# Patient Record
Sex: Male | Born: 1965 | Race: White | Hispanic: No | Marital: Married | Smoking: Former smoker
Health system: Southern US, Community
[De-identification: ages and names within clinical notes are randomized; demographics above are authoritative.]

## PROBLEM LIST (undated history)

## (undated) DIAGNOSIS — M1990 Unspecified osteoarthritis, unspecified site: Secondary | ICD-10-CM

## (undated) DIAGNOSIS — N189 Chronic kidney disease, unspecified: Secondary | ICD-10-CM

## (undated) DIAGNOSIS — G629 Polyneuropathy, unspecified: Secondary | ICD-10-CM

## (undated) DIAGNOSIS — I10 Essential (primary) hypertension: Secondary | ICD-10-CM

## (undated) DIAGNOSIS — E119 Type 2 diabetes mellitus without complications: Secondary | ICD-10-CM

## (undated) DIAGNOSIS — E7800 Pure hypercholesterolemia, unspecified: Secondary | ICD-10-CM

## (undated) HISTORY — PX: ANKLE SURGERY: SHX546

## (undated) HISTORY — PX: COLONOSCOPY: SHX5424

## (undated) HISTORY — DX: Type 2 diabetes mellitus without complications: E11.9

## (undated) HISTORY — PX: CARPAL TUNNEL RELEASE: SHX101

---

## 2013-06-07 ENCOUNTER — Encounter: Payer: BC Managed Care – PPO | Attending: Family Medicine

## 2013-06-07 VITALS — Ht 72.0 in | Wt 301.6 lb

## 2013-06-07 DIAGNOSIS — E119 Type 2 diabetes mellitus without complications: Secondary | ICD-10-CM | POA: Insufficient documentation

## 2013-06-07 DIAGNOSIS — E1165 Type 2 diabetes mellitus with hyperglycemia: Secondary | ICD-10-CM

## 2013-06-07 DIAGNOSIS — IMO0001 Reserved for inherently not codable concepts without codable children: Secondary | ICD-10-CM

## 2013-06-07 DIAGNOSIS — Z713 Dietary counseling and surveillance: Secondary | ICD-10-CM | POA: Insufficient documentation

## 2013-06-07 NOTE — Progress Notes (Signed)
Patient was seen on 06/07/13 for the first of a series of three diabetes self-management courses at the [facility].  Current HbA1c: 11.3% stated by pt  The following learning objectives were met by the patient during this class:  Describe diabetes  State some common risk factors for diabetes  Defines the role of glucose and insulin  Identifies type of diabetes and pathophysiology  Describe the relationship between diabetes and cardiovascular risk  State the members of the Healthcare Team  States the rationale for glucose monitoring  State when to test glucose  State their individual Target Range  State the importance of logging glucose readings  Describe how to interpret glucose readings  Identifies A1C target  Explain the correlation between A1c and eAG values  State symptoms and treatment of high blood glucose  State symptoms and treatment of low blood glucose  Explain proper technique for glucose testing  Identifies proper sharps disposal  Handouts given during class include:  Living Well with Diabetes book  Carb Counting and Meal Planning book  Meal Plan Card  Carbohydrate guide  Meal planning worksheet  Low Sodium Flavoring Tips  The diabetes portion plate  M6Y to eAG Conversion Chart  Diabetes Medications  Diabetes Recommended Care Schedule  Support Group  Diabetes Success Plan  Core Class Satisfaction Survey  Follow-Up Plan:  Attend core 2

## 2013-06-14 DIAGNOSIS — E119 Type 2 diabetes mellitus without complications: Secondary | ICD-10-CM

## 2013-06-15 NOTE — Progress Notes (Signed)

## 2013-06-21 DIAGNOSIS — E119 Type 2 diabetes mellitus without complications: Secondary | ICD-10-CM

## 2013-06-21 NOTE — Progress Notes (Signed)
Patient was seen on 06/21/2013 for the third of a series of three diabetes self-management courses at the [facility]. The following learning objectives were met by the patient during this class:    State the amount of activity recommended for healthy living   Describe activities suitable for individual needs   Identify ways to regularly incorporate activity into daily life   Identify barriers to activity and ways to over come these barriers  Identify diabetes medications being personally used and their primary action for lowering glucose and possible side effects   Describe role of stress on blood glucose and develop strategies to address psychosocial issues   Identify diabetes complications and ways to prevent them  Explain how to manage diabetes during illness   Evaluate success in meeting personal goal   Establish 2-3 goals that they will plan to diligently work on until they return for the  10-monthfollow-up visit  Goals:  Follow Diabetes Meal Plan as instructed  Aim for 15-30 mins of physical activity daily as tolerated  Bring food record and glucose log to your follow up visit  Your patient has established the following 4 month goals in their individualized success plan:  Count Carbohydrates at most meals and snacks  Reduce fat in my diet by eating less meat at two or more meals a day  Test my glucose at least 2 x a day, 2 days a week  Your patient has identified their diabetes self-care support plan as  [facility]  My wife and daughter  Plan:  Attend Core 4 in 4 months

## 2013-10-09 ENCOUNTER — Ambulatory Visit: Payer: BC Managed Care – PPO

## 2013-11-28 ENCOUNTER — Ambulatory Visit
Admission: RE | Admit: 2013-11-28 | Discharge: 2013-11-28 | Disposition: A | Discharge status: Home / Self Care | Payer: BC Managed Care – PPO | Source: Ambulatory Visit | Attending: Family Medicine | Admitting: Family Medicine

## 2013-11-28 ENCOUNTER — Other Ambulatory Visit: Payer: Self-pay | Admitting: Family Medicine

## 2013-11-28 DIAGNOSIS — R109 Unspecified abdominal pain: Secondary | ICD-10-CM

## 2015-06-11 DIAGNOSIS — L97512 Non-pressure chronic ulcer of other part of right foot with fat layer exposed: Secondary | ICD-10-CM | POA: Diagnosis not present

## 2015-06-11 DIAGNOSIS — E11621 Type 2 diabetes mellitus with foot ulcer: Secondary | ICD-10-CM | POA: Diagnosis not present

## 2015-07-01 DIAGNOSIS — E1140 Type 2 diabetes mellitus with diabetic neuropathy, unspecified: Secondary | ICD-10-CM | POA: Diagnosis not present

## 2015-07-01 DIAGNOSIS — Z23 Encounter for immunization: Secondary | ICD-10-CM | POA: Diagnosis not present

## 2015-07-01 DIAGNOSIS — I10 Essential (primary) hypertension: Secondary | ICD-10-CM | POA: Diagnosis not present

## 2015-07-01 DIAGNOSIS — Z7189 Other specified counseling: Secondary | ICD-10-CM | POA: Diagnosis not present

## 2015-07-05 DIAGNOSIS — Z23 Encounter for immunization: Secondary | ICD-10-CM | POA: Diagnosis not present

## 2015-07-11 DIAGNOSIS — L97512 Non-pressure chronic ulcer of other part of right foot with fat layer exposed: Secondary | ICD-10-CM | POA: Diagnosis not present

## 2015-07-11 DIAGNOSIS — E11621 Type 2 diabetes mellitus with foot ulcer: Secondary | ICD-10-CM | POA: Diagnosis not present

## 2015-07-11 DIAGNOSIS — E1142 Type 2 diabetes mellitus with diabetic polyneuropathy: Secondary | ICD-10-CM | POA: Diagnosis not present

## 2015-08-01 DIAGNOSIS — L97512 Non-pressure chronic ulcer of other part of right foot with fat layer exposed: Secondary | ICD-10-CM | POA: Diagnosis not present

## 2015-08-01 DIAGNOSIS — E11621 Type 2 diabetes mellitus with foot ulcer: Secondary | ICD-10-CM | POA: Diagnosis not present

## 2015-08-01 DIAGNOSIS — E1142 Type 2 diabetes mellitus with diabetic polyneuropathy: Secondary | ICD-10-CM | POA: Diagnosis not present

## 2015-08-01 DIAGNOSIS — M2021 Hallux rigidus, right foot: Secondary | ICD-10-CM | POA: Diagnosis not present

## 2015-08-06 DIAGNOSIS — Z23 Encounter for immunization: Secondary | ICD-10-CM | POA: Diagnosis not present

## 2015-08-30 DIAGNOSIS — E11621 Type 2 diabetes mellitus with foot ulcer: Secondary | ICD-10-CM | POA: Diagnosis not present

## 2015-08-30 DIAGNOSIS — E1142 Type 2 diabetes mellitus with diabetic polyneuropathy: Secondary | ICD-10-CM | POA: Diagnosis not present

## 2015-08-30 DIAGNOSIS — L97512 Non-pressure chronic ulcer of other part of right foot with fat layer exposed: Secondary | ICD-10-CM | POA: Diagnosis not present

## 2015-08-30 DIAGNOSIS — M19071 Primary osteoarthritis, right ankle and foot: Secondary | ICD-10-CM | POA: Diagnosis not present

## 2015-08-30 DIAGNOSIS — M19072 Primary osteoarthritis, left ankle and foot: Secondary | ICD-10-CM | POA: Diagnosis not present

## 2015-08-30 DIAGNOSIS — M2021 Hallux rigidus, right foot: Secondary | ICD-10-CM | POA: Diagnosis not present

## 2015-09-12 DIAGNOSIS — E11621 Type 2 diabetes mellitus with foot ulcer: Secondary | ICD-10-CM | POA: Diagnosis not present

## 2015-09-12 DIAGNOSIS — L97521 Non-pressure chronic ulcer of other part of left foot limited to breakdown of skin: Secondary | ICD-10-CM | POA: Diagnosis not present

## 2015-09-12 DIAGNOSIS — M2021 Hallux rigidus, right foot: Secondary | ICD-10-CM | POA: Diagnosis not present

## 2015-09-12 DIAGNOSIS — L97511 Non-pressure chronic ulcer of other part of right foot limited to breakdown of skin: Secondary | ICD-10-CM | POA: Diagnosis not present

## 2015-09-12 DIAGNOSIS — E1142 Type 2 diabetes mellitus with diabetic polyneuropathy: Secondary | ICD-10-CM | POA: Diagnosis not present

## 2015-09-19 DIAGNOSIS — R109 Unspecified abdominal pain: Secondary | ICD-10-CM | POA: Diagnosis not present

## 2015-09-30 DIAGNOSIS — I10 Essential (primary) hypertension: Secondary | ICD-10-CM | POA: Diagnosis not present

## 2015-09-30 DIAGNOSIS — E782 Mixed hyperlipidemia: Secondary | ICD-10-CM | POA: Diagnosis not present

## 2015-09-30 DIAGNOSIS — E1140 Type 2 diabetes mellitus with diabetic neuropathy, unspecified: Secondary | ICD-10-CM | POA: Diagnosis not present

## 2015-09-30 DIAGNOSIS — R1032 Left lower quadrant pain: Secondary | ICD-10-CM | POA: Diagnosis not present

## 2015-09-30 DIAGNOSIS — R109 Unspecified abdominal pain: Secondary | ICD-10-CM | POA: Diagnosis not present

## 2015-10-01 DIAGNOSIS — R160 Hepatomegaly, not elsewhere classified: Secondary | ICD-10-CM | POA: Diagnosis not present

## 2015-10-01 DIAGNOSIS — R109 Unspecified abdominal pain: Secondary | ICD-10-CM | POA: Diagnosis not present

## 2015-10-09 DIAGNOSIS — R197 Diarrhea, unspecified: Secondary | ICD-10-CM | POA: Diagnosis not present

## 2015-10-09 DIAGNOSIS — R195 Other fecal abnormalities: Secondary | ICD-10-CM | POA: Diagnosis not present

## 2015-10-09 DIAGNOSIS — K5792 Diverticulitis of intestine, part unspecified, without perforation or abscess without bleeding: Secondary | ICD-10-CM | POA: Diagnosis not present

## 2015-10-09 DIAGNOSIS — R109 Unspecified abdominal pain: Secondary | ICD-10-CM | POA: Diagnosis not present

## 2015-10-11 DIAGNOSIS — M19079 Primary osteoarthritis, unspecified ankle and foot: Secondary | ICD-10-CM | POA: Diagnosis not present

## 2015-10-11 DIAGNOSIS — E11621 Type 2 diabetes mellitus with foot ulcer: Secondary | ICD-10-CM | POA: Diagnosis not present

## 2015-10-11 DIAGNOSIS — M7752 Other enthesopathy of left foot: Secondary | ICD-10-CM | POA: Diagnosis not present

## 2015-10-11 DIAGNOSIS — R197 Diarrhea, unspecified: Secondary | ICD-10-CM | POA: Diagnosis not present

## 2015-10-11 DIAGNOSIS — L97511 Non-pressure chronic ulcer of other part of right foot limited to breakdown of skin: Secondary | ICD-10-CM | POA: Diagnosis not present

## 2015-10-11 DIAGNOSIS — M7751 Other enthesopathy of right foot: Secondary | ICD-10-CM | POA: Diagnosis not present

## 2015-10-14 DIAGNOSIS — K5792 Diverticulitis of intestine, part unspecified, without perforation or abscess without bleeding: Secondary | ICD-10-CM | POA: Diagnosis not present

## 2015-11-05 DIAGNOSIS — M25572 Pain in left ankle and joints of left foot: Secondary | ICD-10-CM | POA: Diagnosis not present

## 2015-11-05 DIAGNOSIS — L97511 Non-pressure chronic ulcer of other part of right foot limited to breakdown of skin: Secondary | ICD-10-CM | POA: Diagnosis not present

## 2015-11-05 DIAGNOSIS — E11621 Type 2 diabetes mellitus with foot ulcer: Secondary | ICD-10-CM | POA: Diagnosis not present

## 2015-11-05 DIAGNOSIS — E1142 Type 2 diabetes mellitus with diabetic polyneuropathy: Secondary | ICD-10-CM | POA: Diagnosis not present

## 2015-11-08 DIAGNOSIS — K5792 Diverticulitis of intestine, part unspecified, without perforation or abscess without bleeding: Secondary | ICD-10-CM | POA: Diagnosis not present

## 2015-11-08 DIAGNOSIS — R319 Hematuria, unspecified: Secondary | ICD-10-CM | POA: Diagnosis not present

## 2015-11-08 DIAGNOSIS — R195 Other fecal abnormalities: Secondary | ICD-10-CM | POA: Diagnosis not present

## 2015-11-08 DIAGNOSIS — M5442 Lumbago with sciatica, left side: Secondary | ICD-10-CM | POA: Diagnosis not present

## 2015-11-13 DIAGNOSIS — M19072 Primary osteoarthritis, left ankle and foot: Secondary | ICD-10-CM | POA: Diagnosis not present

## 2015-11-13 DIAGNOSIS — M25472 Effusion, left ankle: Secondary | ICD-10-CM | POA: Diagnosis not present

## 2015-11-21 DIAGNOSIS — K921 Melena: Secondary | ICD-10-CM | POA: Diagnosis not present

## 2015-12-02 IMAGING — CT CT ABD-PELV W/O CM
3 of 4 series · 8 of 46 positions shown, 15 images · non-contrast
Comparison: CT abdomen and pelvis 09/24/2005.

CLINICAL DATA: Left flank and left lower quadrant pain for 5 days.

EXAM:
CT ABDOMEN AND PELVIS WITHOUT CONTRAST
TECHNIQUE: Multidetector CT imaging of the abdomen and pelvis was performed
following the standard protocol without IV contrast.

[Series 3: lung windows · axial · 0.72mm/px · z∈[-107,-32]mm · 4 of 27 slices shown, 9 images]
[im 6/27  soft-tissue]
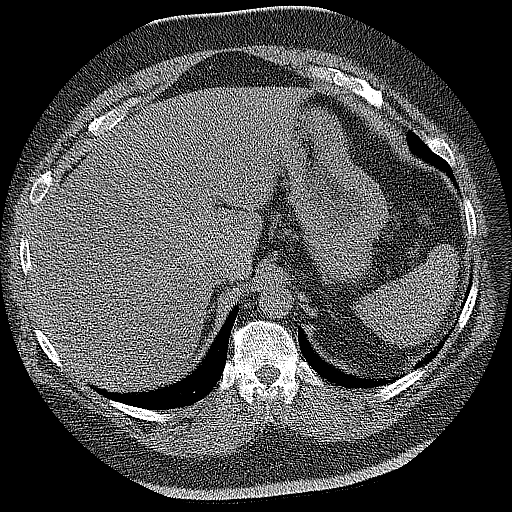
[im 6/27  lung]
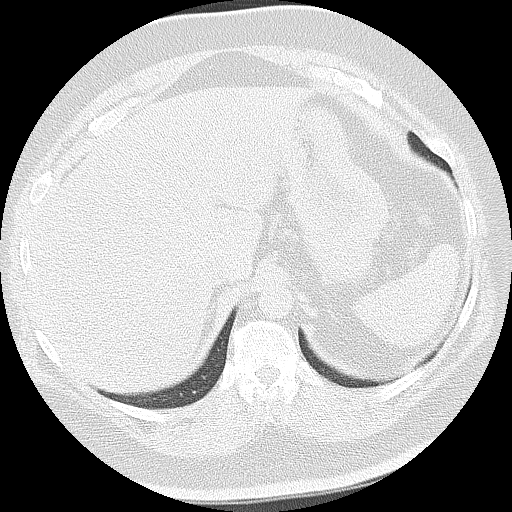
[im 6/27  bone]
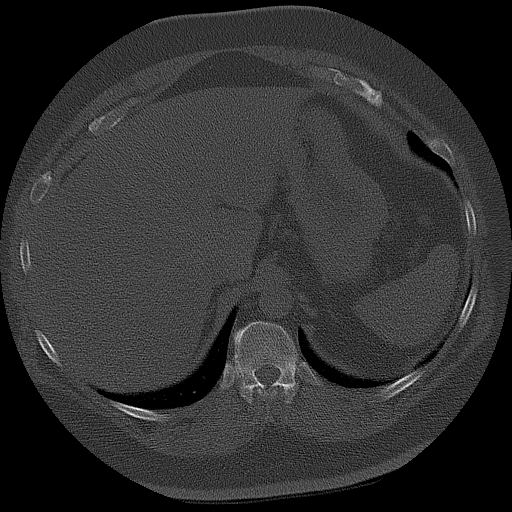
[im 11/27  soft-tissue]
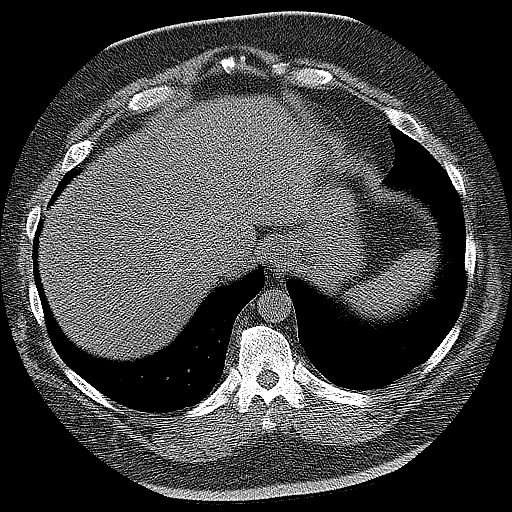
[im 11/27  lung]
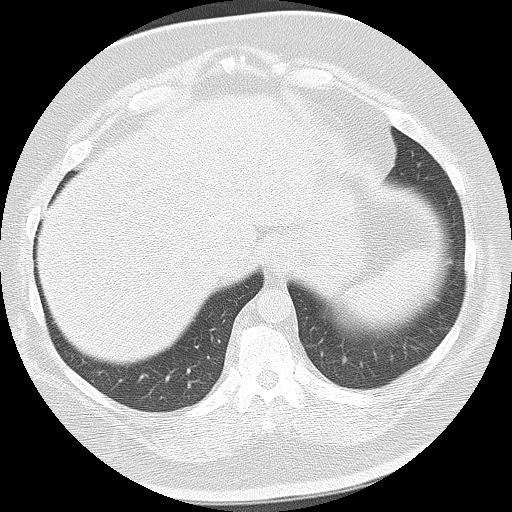
[im 16/27  soft-tissue]
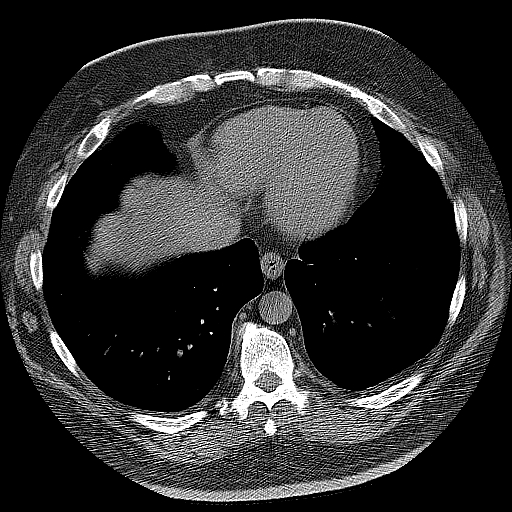
[im 16/27  lung]
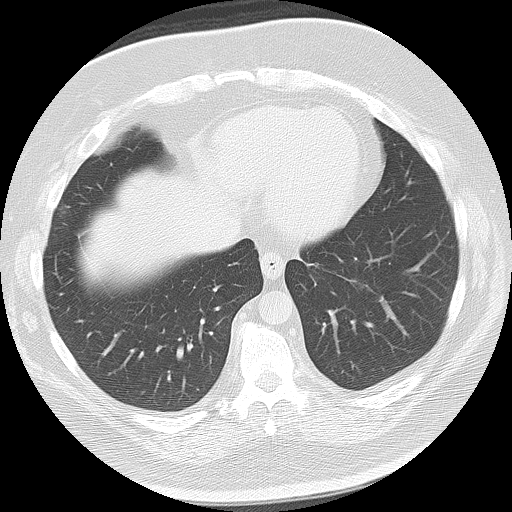
[im 21/27  soft-tissue]
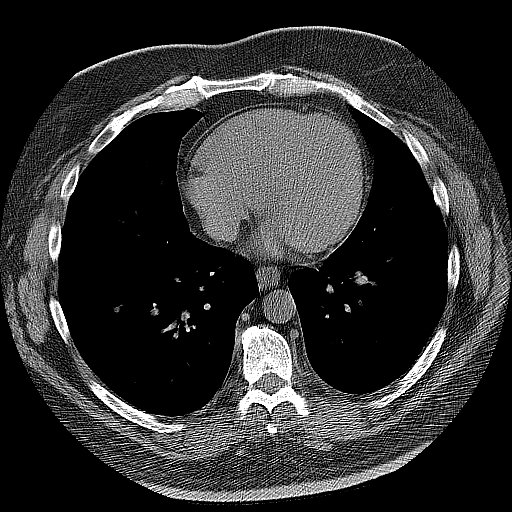
[im 21/27  lung]
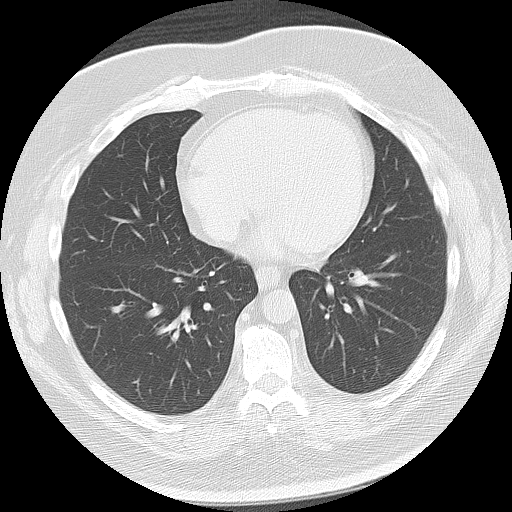

[Series 400: cor · coronal · 1.06mm/px · 3 of 182 slices shown, 4 images]
[im 61/182  soft-tissue]
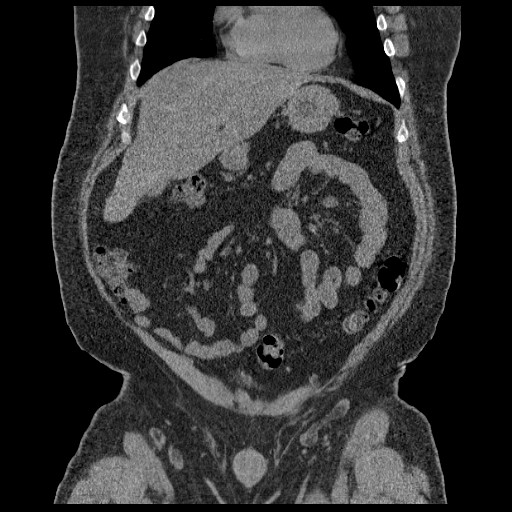
[im 81/182  soft-tissue]
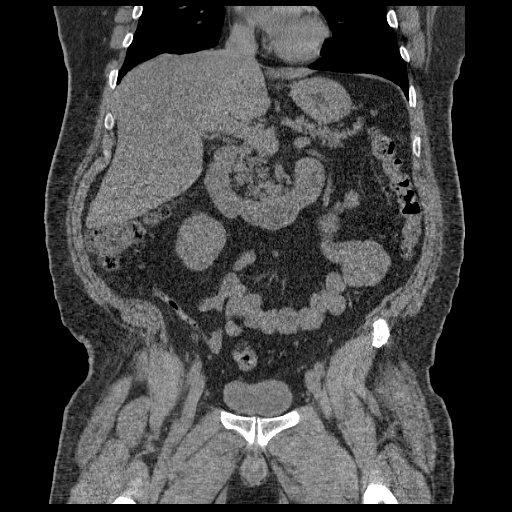
[im 81/182  bone]
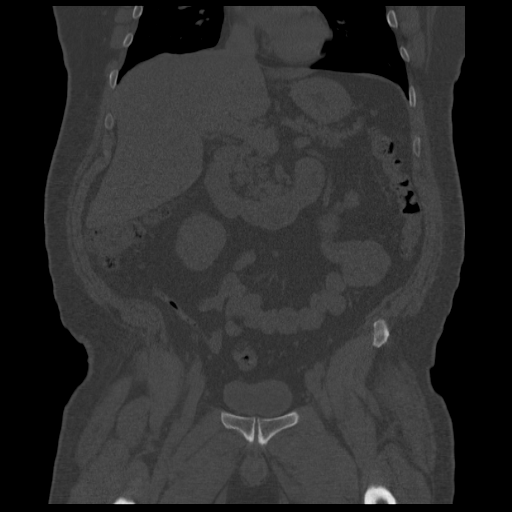
[im 101/182  soft-tissue]
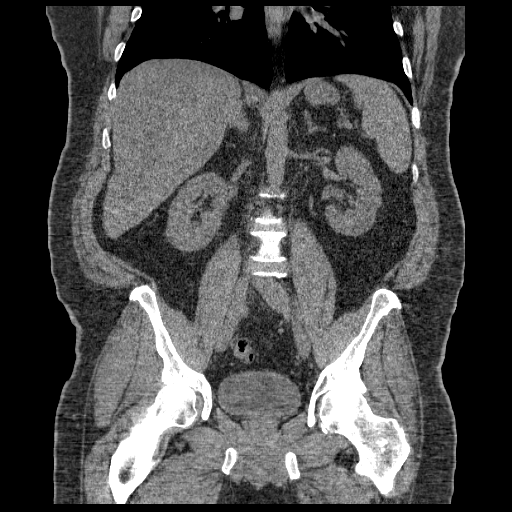

[Series 401: sag · sagittal · 1.06mm/px · 1 of 216 slices shown, 2 images]
[im 72/216  soft-tissue]
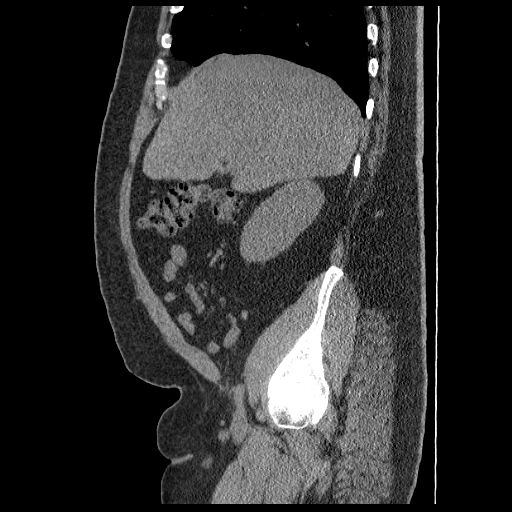
[im 72/216  bone]
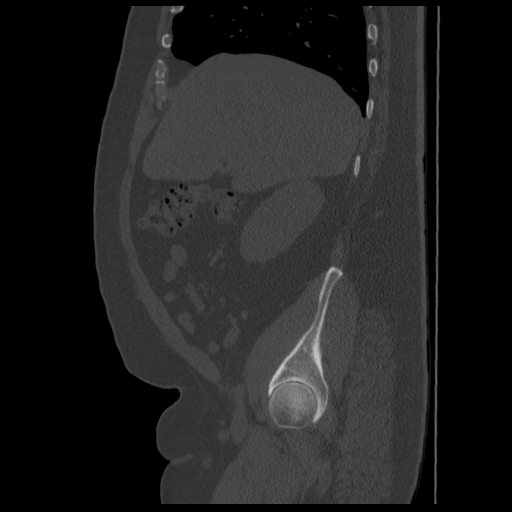

[8 of 46 positions shown; findings below may reference images not displayed]

FINDINGS: The lung bases are clear. No pleural or pericardial effusion. Heart
size is normal.

There are no renal or ureteral stones on the right or left. There is
no hydronephrosis. The kidneys have an unremarkable appearance.

The liver is diffusely low attenuating consistent with fatty
infiltration. No focal liver lesion is seen. The gallbladder,
adrenal glands, spleen and pancreas appear normal. Seminal vesicles,
prostate gland and urinary bladder are unremarkable. The stomach,
small and large bowel and appendix appear normal. There is no
lymphadenopathy or fluid. No focal bony abnormality is identified.
IMPRESSION: No acute finding abdomen or pelvis. Negative for urinary tract
stone.

Diffuse fatty infiltration of the liver.

## 2015-12-03 DIAGNOSIS — M19072 Primary osteoarthritis, left ankle and foot: Secondary | ICD-10-CM | POA: Diagnosis not present

## 2016-01-02 DIAGNOSIS — I10 Essential (primary) hypertension: Secondary | ICD-10-CM | POA: Diagnosis not present

## 2016-01-02 DIAGNOSIS — E782 Mixed hyperlipidemia: Secondary | ICD-10-CM | POA: Diagnosis not present

## 2016-01-02 DIAGNOSIS — Z01811 Encounter for preprocedural respiratory examination: Secondary | ICD-10-CM | POA: Diagnosis not present

## 2016-01-02 DIAGNOSIS — E1140 Type 2 diabetes mellitus with diabetic neuropathy, unspecified: Secondary | ICD-10-CM | POA: Diagnosis not present

## 2016-01-03 ENCOUNTER — Ambulatory Visit (INDEPENDENT_AMBULATORY_CARE_PROVIDER_SITE_OTHER): Payer: BLUE CROSS/BLUE SHIELD | Admitting: Urology

## 2016-01-03 ENCOUNTER — Other Ambulatory Visit (HOSPITAL_COMMUNITY)
Admission: RE | Admit: 2016-01-03 | Discharge: 2016-01-03 | Disposition: A | Discharge status: Home / Self Care | Payer: BLUE CROSS/BLUE SHIELD | Source: Other Acute Inpatient Hospital | Attending: Urology | Admitting: Urology

## 2016-01-03 DIAGNOSIS — R808 Other proteinuria: Secondary | ICD-10-CM | POA: Diagnosis not present

## 2016-01-03 DIAGNOSIS — R3121 Asymptomatic microscopic hematuria: Secondary | ICD-10-CM | POA: Diagnosis not present

## 2016-01-03 LAB — URINALYSIS, ROUTINE W REFLEX MICROSCOPIC
BILIRUBIN URINE: NEGATIVE
KETONES UR: NEGATIVE mg/dL
Leukocytes, UA: NEGATIVE
Nitrite: NEGATIVE
PH: 6 (ref 5.0–8.0)
Protein, ur: >300 mg/dL — AB
Specific Gravity, Urine: 1.025 (ref 1.005–1.030)

## 2016-01-03 LAB — URINE MICROSCOPIC-ADD ON

## 2016-01-13 DIAGNOSIS — R808 Other proteinuria: Secondary | ICD-10-CM | POA: Diagnosis not present

## 2016-01-22 DIAGNOSIS — Z6839 Body mass index (BMI) 39.0-39.9, adult: Secondary | ICD-10-CM | POA: Diagnosis not present

## 2016-01-22 DIAGNOSIS — I10 Essential (primary) hypertension: Secondary | ICD-10-CM | POA: Diagnosis not present

## 2016-01-22 DIAGNOSIS — E669 Obesity, unspecified: Secondary | ICD-10-CM | POA: Diagnosis not present

## 2016-01-22 DIAGNOSIS — M65872 Other synovitis and tenosynovitis, left ankle and foot: Secondary | ICD-10-CM | POA: Diagnosis not present

## 2016-01-22 DIAGNOSIS — Z87891 Personal history of nicotine dependence: Secondary | ICD-10-CM | POA: Diagnosis not present

## 2016-01-22 DIAGNOSIS — E785 Hyperlipidemia, unspecified: Secondary | ICD-10-CM | POA: Diagnosis not present

## 2016-01-22 DIAGNOSIS — Z79899 Other long term (current) drug therapy: Secondary | ICD-10-CM | POA: Diagnosis not present

## 2016-01-22 DIAGNOSIS — M1990 Unspecified osteoarthritis, unspecified site: Secondary | ICD-10-CM | POA: Diagnosis not present

## 2016-01-22 DIAGNOSIS — G8918 Other acute postprocedural pain: Secondary | ICD-10-CM | POA: Diagnosis not present

## 2016-01-22 DIAGNOSIS — E11618 Type 2 diabetes mellitus with other diabetic arthropathy: Secondary | ICD-10-CM | POA: Diagnosis not present

## 2016-01-22 DIAGNOSIS — M19072 Primary osteoarthritis, left ankle and foot: Secondary | ICD-10-CM | POA: Diagnosis not present

## 2016-01-22 DIAGNOSIS — Z7982 Long term (current) use of aspirin: Secondary | ICD-10-CM | POA: Diagnosis not present

## 2016-01-22 DIAGNOSIS — Z7984 Long term (current) use of oral hypoglycemic drugs: Secondary | ICD-10-CM | POA: Diagnosis not present

## 2016-02-05 DIAGNOSIS — Z4789 Encounter for other orthopedic aftercare: Secondary | ICD-10-CM | POA: Diagnosis not present

## 2016-02-28 DIAGNOSIS — Z967 Presence of other bone and tendon implants: Secondary | ICD-10-CM | POA: Diagnosis not present

## 2016-02-28 DIAGNOSIS — Z4789 Encounter for other orthopedic aftercare: Secondary | ICD-10-CM | POA: Diagnosis not present

## 2016-02-28 DIAGNOSIS — Z9889 Other specified postprocedural states: Secondary | ICD-10-CM | POA: Diagnosis not present

## 2016-03-19 DIAGNOSIS — Z4789 Encounter for other orthopedic aftercare: Secondary | ICD-10-CM | POA: Diagnosis not present

## 2016-04-03 DIAGNOSIS — Z967 Presence of other bone and tendon implants: Secondary | ICD-10-CM | POA: Diagnosis not present

## 2016-04-03 DIAGNOSIS — Z4789 Encounter for other orthopedic aftercare: Secondary | ICD-10-CM | POA: Diagnosis not present

## 2016-04-21 DIAGNOSIS — R809 Proteinuria, unspecified: Secondary | ICD-10-CM | POA: Diagnosis not present

## 2016-04-21 DIAGNOSIS — E1129 Type 2 diabetes mellitus with other diabetic kidney complication: Secondary | ICD-10-CM | POA: Diagnosis not present

## 2016-04-21 DIAGNOSIS — I10 Essential (primary) hypertension: Secondary | ICD-10-CM | POA: Diagnosis not present

## 2016-04-21 DIAGNOSIS — E669 Obesity, unspecified: Secondary | ICD-10-CM | POA: Diagnosis not present

## 2016-04-30 ENCOUNTER — Other Ambulatory Visit (HOSPITAL_COMMUNITY): Payer: Self-pay | Admitting: Nephrology

## 2016-04-30 DIAGNOSIS — N183 Chronic kidney disease, stage 3 unspecified: Secondary | ICD-10-CM

## 2016-05-01 DIAGNOSIS — Z4789 Encounter for other orthopedic aftercare: Secondary | ICD-10-CM | POA: Diagnosis not present

## 2016-05-06 DIAGNOSIS — M19072 Primary osteoarthritis, left ankle and foot: Secondary | ICD-10-CM | POA: Diagnosis not present

## 2016-05-06 DIAGNOSIS — M25572 Pain in left ankle and joints of left foot: Secondary | ICD-10-CM | POA: Diagnosis not present

## 2016-05-06 DIAGNOSIS — Z4789 Encounter for other orthopedic aftercare: Secondary | ICD-10-CM | POA: Diagnosis not present

## 2016-05-08 DIAGNOSIS — M19072 Primary osteoarthritis, left ankle and foot: Secondary | ICD-10-CM | POA: Diagnosis not present

## 2016-05-08 DIAGNOSIS — Z4789 Encounter for other orthopedic aftercare: Secondary | ICD-10-CM | POA: Diagnosis not present

## 2016-05-08 DIAGNOSIS — M25572 Pain in left ankle and joints of left foot: Secondary | ICD-10-CM | POA: Diagnosis not present

## 2016-05-11 DIAGNOSIS — M25572 Pain in left ankle and joints of left foot: Secondary | ICD-10-CM | POA: Diagnosis not present

## 2016-05-11 DIAGNOSIS — Z4789 Encounter for other orthopedic aftercare: Secondary | ICD-10-CM | POA: Diagnosis not present

## 2016-05-11 DIAGNOSIS — M19072 Primary osteoarthritis, left ankle and foot: Secondary | ICD-10-CM | POA: Diagnosis not present

## 2016-05-14 ENCOUNTER — Ambulatory Visit (HOSPITAL_COMMUNITY)
Admission: RE | Admit: 2016-05-14 | Discharge: 2016-05-14 | Disposition: A | Discharge status: Home / Self Care | Payer: BLUE CROSS/BLUE SHIELD | Source: Ambulatory Visit | Attending: Nephrology | Admitting: Nephrology

## 2016-05-14 ENCOUNTER — Ambulatory Visit (HOSPITAL_COMMUNITY): Payer: BLUE CROSS/BLUE SHIELD

## 2016-05-14 DIAGNOSIS — N183 Chronic kidney disease, stage 3 unspecified: Secondary | ICD-10-CM

## 2016-05-14 DIAGNOSIS — D509 Iron deficiency anemia, unspecified: Secondary | ICD-10-CM | POA: Diagnosis not present

## 2016-05-14 DIAGNOSIS — E559 Vitamin D deficiency, unspecified: Secondary | ICD-10-CM | POA: Diagnosis not present

## 2016-05-14 DIAGNOSIS — R809 Proteinuria, unspecified: Secondary | ICD-10-CM | POA: Diagnosis not present

## 2016-05-14 DIAGNOSIS — Z79899 Other long term (current) drug therapy: Secondary | ICD-10-CM | POA: Diagnosis not present

## 2016-05-15 DIAGNOSIS — M19072 Primary osteoarthritis, left ankle and foot: Secondary | ICD-10-CM | POA: Diagnosis not present

## 2016-05-15 DIAGNOSIS — M25572 Pain in left ankle and joints of left foot: Secondary | ICD-10-CM | POA: Diagnosis not present

## 2016-05-15 DIAGNOSIS — Z4789 Encounter for other orthopedic aftercare: Secondary | ICD-10-CM | POA: Diagnosis not present

## 2016-05-18 DIAGNOSIS — Z4789 Encounter for other orthopedic aftercare: Secondary | ICD-10-CM | POA: Diagnosis not present

## 2016-05-18 DIAGNOSIS — M19072 Primary osteoarthritis, left ankle and foot: Secondary | ICD-10-CM | POA: Diagnosis not present

## 2016-05-18 DIAGNOSIS — M25572 Pain in left ankle and joints of left foot: Secondary | ICD-10-CM | POA: Diagnosis not present

## 2016-05-21 DIAGNOSIS — M19072 Primary osteoarthritis, left ankle and foot: Secondary | ICD-10-CM | POA: Diagnosis not present

## 2016-05-21 DIAGNOSIS — M25572 Pain in left ankle and joints of left foot: Secondary | ICD-10-CM | POA: Diagnosis not present

## 2016-05-21 DIAGNOSIS — Z4789 Encounter for other orthopedic aftercare: Secondary | ICD-10-CM | POA: Diagnosis not present

## 2016-05-22 DIAGNOSIS — Z4789 Encounter for other orthopedic aftercare: Secondary | ICD-10-CM | POA: Diagnosis not present

## 2016-05-25 DIAGNOSIS — M19072 Primary osteoarthritis, left ankle and foot: Secondary | ICD-10-CM | POA: Diagnosis not present

## 2016-05-25 DIAGNOSIS — M25572 Pain in left ankle and joints of left foot: Secondary | ICD-10-CM | POA: Diagnosis not present

## 2016-05-25 DIAGNOSIS — Z4789 Encounter for other orthopedic aftercare: Secondary | ICD-10-CM | POA: Diagnosis not present

## 2016-05-27 DIAGNOSIS — E875 Hyperkalemia: Secondary | ICD-10-CM | POA: Diagnosis not present

## 2016-05-29 DIAGNOSIS — R600 Localized edema: Secondary | ICD-10-CM | POA: Diagnosis not present

## 2016-06-01 DIAGNOSIS — Z4789 Encounter for other orthopedic aftercare: Secondary | ICD-10-CM | POA: Diagnosis not present

## 2016-06-01 DIAGNOSIS — M19072 Primary osteoarthritis, left ankle and foot: Secondary | ICD-10-CM | POA: Diagnosis not present

## 2016-06-01 DIAGNOSIS — E875 Hyperkalemia: Secondary | ICD-10-CM | POA: Diagnosis not present

## 2016-06-01 DIAGNOSIS — M25572 Pain in left ankle and joints of left foot: Secondary | ICD-10-CM | POA: Diagnosis not present

## 2016-06-02 DIAGNOSIS — E875 Hyperkalemia: Secondary | ICD-10-CM | POA: Diagnosis not present

## 2016-06-02 DIAGNOSIS — I10 Essential (primary) hypertension: Secondary | ICD-10-CM | POA: Diagnosis not present

## 2016-06-02 DIAGNOSIS — R809 Proteinuria, unspecified: Secondary | ICD-10-CM | POA: Diagnosis not present

## 2016-06-04 DIAGNOSIS — M25572 Pain in left ankle and joints of left foot: Secondary | ICD-10-CM | POA: Diagnosis not present

## 2016-06-04 DIAGNOSIS — M19072 Primary osteoarthritis, left ankle and foot: Secondary | ICD-10-CM | POA: Diagnosis not present

## 2016-06-04 DIAGNOSIS — Z4789 Encounter for other orthopedic aftercare: Secondary | ICD-10-CM | POA: Diagnosis not present

## 2016-06-08 DIAGNOSIS — Z4789 Encounter for other orthopedic aftercare: Secondary | ICD-10-CM | POA: Diagnosis not present

## 2016-06-08 DIAGNOSIS — M25572 Pain in left ankle and joints of left foot: Secondary | ICD-10-CM | POA: Diagnosis not present

## 2016-06-08 DIAGNOSIS — M19072 Primary osteoarthritis, left ankle and foot: Secondary | ICD-10-CM | POA: Diagnosis not present

## 2016-06-11 DIAGNOSIS — M19072 Primary osteoarthritis, left ankle and foot: Secondary | ICD-10-CM | POA: Diagnosis not present

## 2016-06-11 DIAGNOSIS — Z4789 Encounter for other orthopedic aftercare: Secondary | ICD-10-CM | POA: Diagnosis not present

## 2016-06-11 DIAGNOSIS — M25572 Pain in left ankle and joints of left foot: Secondary | ICD-10-CM | POA: Diagnosis not present

## 2016-06-15 DIAGNOSIS — M25572 Pain in left ankle and joints of left foot: Secondary | ICD-10-CM | POA: Diagnosis not present

## 2016-06-15 DIAGNOSIS — Z4789 Encounter for other orthopedic aftercare: Secondary | ICD-10-CM | POA: Diagnosis not present

## 2016-06-15 DIAGNOSIS — M19072 Primary osteoarthritis, left ankle and foot: Secondary | ICD-10-CM | POA: Diagnosis not present

## 2016-06-18 DIAGNOSIS — R600 Localized edema: Secondary | ICD-10-CM | POA: Diagnosis not present

## 2016-06-18 DIAGNOSIS — I10 Essential (primary) hypertension: Secondary | ICD-10-CM | POA: Diagnosis not present

## 2016-06-18 DIAGNOSIS — Z4789 Encounter for other orthopedic aftercare: Secondary | ICD-10-CM | POA: Diagnosis not present

## 2016-06-18 DIAGNOSIS — Z9889 Other specified postprocedural states: Secondary | ICD-10-CM | POA: Diagnosis not present

## 2016-06-18 DIAGNOSIS — G63 Polyneuropathy in diseases classified elsewhere: Secondary | ICD-10-CM | POA: Diagnosis not present

## 2016-06-18 DIAGNOSIS — Z967 Presence of other bone and tendon implants: Secondary | ICD-10-CM | POA: Diagnosis not present

## 2016-06-22 DIAGNOSIS — M25475 Effusion, left foot: Secondary | ICD-10-CM | POA: Diagnosis not present

## 2016-06-22 DIAGNOSIS — M7732 Calcaneal spur, left foot: Secondary | ICD-10-CM | POA: Diagnosis not present

## 2016-06-22 DIAGNOSIS — Z9889 Other specified postprocedural states: Secondary | ICD-10-CM | POA: Diagnosis not present

## 2016-06-22 DIAGNOSIS — Z967 Presence of other bone and tendon implants: Secondary | ICD-10-CM | POA: Diagnosis not present

## 2016-06-22 DIAGNOSIS — R600 Localized edema: Secondary | ICD-10-CM | POA: Diagnosis not present

## 2016-07-02 DIAGNOSIS — Z967 Presence of other bone and tendon implants: Secondary | ICD-10-CM | POA: Diagnosis not present

## 2016-07-02 DIAGNOSIS — G63 Polyneuropathy in diseases classified elsewhere: Secondary | ICD-10-CM | POA: Diagnosis not present

## 2016-07-02 DIAGNOSIS — E1142 Type 2 diabetes mellitus with diabetic polyneuropathy: Secondary | ICD-10-CM | POA: Diagnosis not present

## 2016-07-02 DIAGNOSIS — Z9889 Other specified postprocedural states: Secondary | ICD-10-CM | POA: Diagnosis not present

## 2016-07-02 DIAGNOSIS — I10 Essential (primary) hypertension: Secondary | ICD-10-CM | POA: Diagnosis not present

## 2016-07-06 DIAGNOSIS — E782 Mixed hyperlipidemia: Secondary | ICD-10-CM | POA: Diagnosis not present

## 2016-07-06 DIAGNOSIS — I10 Essential (primary) hypertension: Secondary | ICD-10-CM | POA: Diagnosis not present

## 2016-07-06 DIAGNOSIS — E1140 Type 2 diabetes mellitus with diabetic neuropathy, unspecified: Secondary | ICD-10-CM | POA: Diagnosis not present

## 2016-07-06 DIAGNOSIS — G629 Polyneuropathy, unspecified: Secondary | ICD-10-CM | POA: Diagnosis not present

## 2016-07-06 DIAGNOSIS — Z01818 Encounter for other preprocedural examination: Secondary | ICD-10-CM | POA: Diagnosis not present

## 2016-07-10 DIAGNOSIS — R809 Proteinuria, unspecified: Secondary | ICD-10-CM | POA: Diagnosis not present

## 2016-07-10 DIAGNOSIS — E875 Hyperkalemia: Secondary | ICD-10-CM | POA: Diagnosis not present

## 2016-07-10 DIAGNOSIS — I10 Essential (primary) hypertension: Secondary | ICD-10-CM | POA: Diagnosis not present

## 2016-07-17 DIAGNOSIS — D72829 Elevated white blood cell count, unspecified: Secondary | ICD-10-CM | POA: Diagnosis not present

## 2016-07-20 DIAGNOSIS — E11610 Type 2 diabetes mellitus with diabetic neuropathic arthropathy: Secondary | ICD-10-CM | POA: Diagnosis not present

## 2016-07-20 DIAGNOSIS — M960 Pseudarthrosis after fusion or arthrodesis: Secondary | ICD-10-CM | POA: Diagnosis not present

## 2016-07-20 DIAGNOSIS — M14679 Charcot's joint, unspecified ankle and foot: Secondary | ICD-10-CM | POA: Diagnosis not present

## 2016-07-20 DIAGNOSIS — I10 Essential (primary) hypertension: Secondary | ICD-10-CM | POA: Diagnosis not present

## 2016-07-23 DIAGNOSIS — D649 Anemia, unspecified: Secondary | ICD-10-CM | POA: Diagnosis not present

## 2016-07-27 DIAGNOSIS — Z79899 Other long term (current) drug therapy: Secondary | ICD-10-CM | POA: Diagnosis not present

## 2016-07-27 DIAGNOSIS — I10 Essential (primary) hypertension: Secondary | ICD-10-CM | POA: Diagnosis not present

## 2016-07-27 DIAGNOSIS — E785 Hyperlipidemia, unspecified: Secondary | ICD-10-CM | POA: Diagnosis not present

## 2016-07-27 DIAGNOSIS — M14672 Charcot's joint, left ankle and foot: Secondary | ICD-10-CM | POA: Diagnosis not present

## 2016-07-27 DIAGNOSIS — E1340 Other specified diabetes mellitus with diabetic neuropathy, unspecified: Secondary | ICD-10-CM | POA: Diagnosis not present

## 2016-07-27 DIAGNOSIS — M1990 Unspecified osteoarthritis, unspecified site: Secondary | ICD-10-CM | POA: Diagnosis not present

## 2016-07-27 DIAGNOSIS — A5216 Charcot's arthropathy (tabetic): Secondary | ICD-10-CM | POA: Diagnosis not present

## 2016-07-27 DIAGNOSIS — E11610 Type 2 diabetes mellitus with diabetic neuropathic arthropathy: Secondary | ICD-10-CM | POA: Diagnosis not present

## 2016-07-27 DIAGNOSIS — G8918 Other acute postprocedural pain: Secondary | ICD-10-CM | POA: Diagnosis not present

## 2016-07-27 DIAGNOSIS — Z981 Arthrodesis status: Secondary | ICD-10-CM | POA: Diagnosis not present

## 2016-07-27 DIAGNOSIS — Z7982 Long term (current) use of aspirin: Secondary | ICD-10-CM | POA: Diagnosis not present

## 2016-07-27 DIAGNOSIS — Z87891 Personal history of nicotine dependence: Secondary | ICD-10-CM | POA: Diagnosis not present

## 2016-07-27 DIAGNOSIS — K760 Fatty (change of) liver, not elsewhere classified: Secondary | ICD-10-CM | POA: Diagnosis not present

## 2016-07-28 DIAGNOSIS — I358 Other nonrheumatic aortic valve disorders: Secondary | ICD-10-CM | POA: Diagnosis not present

## 2016-07-28 DIAGNOSIS — E1340 Other specified diabetes mellitus with diabetic neuropathy, unspecified: Secondary | ICD-10-CM | POA: Diagnosis not present

## 2016-07-28 DIAGNOSIS — I517 Cardiomegaly: Secondary | ICD-10-CM | POA: Diagnosis not present

## 2016-07-28 DIAGNOSIS — I10 Essential (primary) hypertension: Secondary | ICD-10-CM | POA: Diagnosis not present

## 2016-07-28 DIAGNOSIS — K760 Fatty (change of) liver, not elsewhere classified: Secondary | ICD-10-CM | POA: Diagnosis not present

## 2016-07-28 DIAGNOSIS — Z79899 Other long term (current) drug therapy: Secondary | ICD-10-CM | POA: Diagnosis not present

## 2016-07-28 DIAGNOSIS — Z9889 Other specified postprocedural states: Secondary | ICD-10-CM | POA: Diagnosis not present

## 2016-07-28 DIAGNOSIS — Z7982 Long term (current) use of aspirin: Secondary | ICD-10-CM | POA: Diagnosis not present

## 2016-07-28 DIAGNOSIS — M1990 Unspecified osteoarthritis, unspecified site: Secondary | ICD-10-CM | POA: Diagnosis not present

## 2016-07-28 DIAGNOSIS — A5216 Charcot's arthropathy (tabetic): Secondary | ICD-10-CM | POA: Diagnosis not present

## 2016-07-28 DIAGNOSIS — R079 Chest pain, unspecified: Secondary | ICD-10-CM | POA: Diagnosis not present

## 2016-07-28 DIAGNOSIS — E1142 Type 2 diabetes mellitus with diabetic polyneuropathy: Secondary | ICD-10-CM | POA: Diagnosis not present

## 2016-07-28 DIAGNOSIS — Z87891 Personal history of nicotine dependence: Secondary | ICD-10-CM | POA: Diagnosis not present

## 2016-07-28 DIAGNOSIS — E11610 Type 2 diabetes mellitus with diabetic neuropathic arthropathy: Secondary | ICD-10-CM | POA: Diagnosis not present

## 2016-07-28 DIAGNOSIS — E785 Hyperlipidemia, unspecified: Secondary | ICD-10-CM | POA: Diagnosis not present

## 2016-07-29 DIAGNOSIS — K760 Fatty (change of) liver, not elsewhere classified: Secondary | ICD-10-CM | POA: Diagnosis not present

## 2016-07-29 DIAGNOSIS — A5216 Charcot's arthropathy (tabetic): Secondary | ICD-10-CM | POA: Diagnosis not present

## 2016-07-29 DIAGNOSIS — Z87891 Personal history of nicotine dependence: Secondary | ICD-10-CM | POA: Diagnosis not present

## 2016-07-29 DIAGNOSIS — E1340 Other specified diabetes mellitus with diabetic neuropathy, unspecified: Secondary | ICD-10-CM | POA: Diagnosis not present

## 2016-07-29 DIAGNOSIS — E11610 Type 2 diabetes mellitus with diabetic neuropathic arthropathy: Secondary | ICD-10-CM | POA: Diagnosis not present

## 2016-07-29 DIAGNOSIS — R079 Chest pain, unspecified: Secondary | ICD-10-CM | POA: Diagnosis not present

## 2016-07-29 DIAGNOSIS — E785 Hyperlipidemia, unspecified: Secondary | ICD-10-CM | POA: Diagnosis not present

## 2016-07-29 DIAGNOSIS — E1142 Type 2 diabetes mellitus with diabetic polyneuropathy: Secondary | ICD-10-CM | POA: Diagnosis not present

## 2016-07-29 DIAGNOSIS — M1990 Unspecified osteoarthritis, unspecified site: Secondary | ICD-10-CM | POA: Diagnosis not present

## 2016-07-29 DIAGNOSIS — Z9889 Other specified postprocedural states: Secondary | ICD-10-CM | POA: Diagnosis not present

## 2016-07-29 DIAGNOSIS — I52 Other heart disorders in diseases classified elsewhere: Secondary | ICD-10-CM | POA: Diagnosis not present

## 2016-07-29 DIAGNOSIS — I10 Essential (primary) hypertension: Secondary | ICD-10-CM | POA: Diagnosis not present

## 2016-07-29 DIAGNOSIS — Z7982 Long term (current) use of aspirin: Secondary | ICD-10-CM | POA: Diagnosis not present

## 2016-07-29 DIAGNOSIS — Z79899 Other long term (current) drug therapy: Secondary | ICD-10-CM | POA: Diagnosis not present

## 2016-07-30 DIAGNOSIS — E785 Hyperlipidemia, unspecified: Secondary | ICD-10-CM | POA: Diagnosis not present

## 2016-07-30 DIAGNOSIS — A5216 Charcot's arthropathy (tabetic): Secondary | ICD-10-CM | POA: Diagnosis not present

## 2016-07-30 DIAGNOSIS — Z7982 Long term (current) use of aspirin: Secondary | ICD-10-CM | POA: Diagnosis not present

## 2016-07-30 DIAGNOSIS — Z87891 Personal history of nicotine dependence: Secondary | ICD-10-CM | POA: Diagnosis not present

## 2016-07-30 DIAGNOSIS — I10 Essential (primary) hypertension: Secondary | ICD-10-CM | POA: Diagnosis not present

## 2016-07-30 DIAGNOSIS — M1990 Unspecified osteoarthritis, unspecified site: Secondary | ICD-10-CM | POA: Diagnosis not present

## 2016-07-30 DIAGNOSIS — R079 Chest pain, unspecified: Secondary | ICD-10-CM | POA: Diagnosis not present

## 2016-07-30 DIAGNOSIS — K760 Fatty (change of) liver, not elsewhere classified: Secondary | ICD-10-CM | POA: Diagnosis not present

## 2016-07-30 DIAGNOSIS — Z79899 Other long term (current) drug therapy: Secondary | ICD-10-CM | POA: Diagnosis not present

## 2016-07-30 DIAGNOSIS — M14672 Charcot's joint, left ankle and foot: Secondary | ICD-10-CM | POA: Diagnosis not present

## 2016-07-30 DIAGNOSIS — Z9889 Other specified postprocedural states: Secondary | ICD-10-CM | POA: Diagnosis not present

## 2016-07-30 DIAGNOSIS — E1340 Other specified diabetes mellitus with diabetic neuropathy, unspecified: Secondary | ICD-10-CM | POA: Diagnosis not present

## 2016-07-31 DIAGNOSIS — E11610 Type 2 diabetes mellitus with diabetic neuropathic arthropathy: Secondary | ICD-10-CM | POA: Diagnosis not present

## 2016-07-31 DIAGNOSIS — M19079 Primary osteoarthritis, unspecified ankle and foot: Secondary | ICD-10-CM | POA: Diagnosis not present

## 2016-07-31 DIAGNOSIS — Z794 Long term (current) use of insulin: Secondary | ICD-10-CM | POA: Diagnosis not present

## 2016-07-31 DIAGNOSIS — Z87891 Personal history of nicotine dependence: Secondary | ICD-10-CM | POA: Diagnosis not present

## 2016-07-31 DIAGNOSIS — M1991 Primary osteoarthritis, unspecified site: Secondary | ICD-10-CM | POA: Diagnosis not present

## 2016-07-31 DIAGNOSIS — Z4789 Encounter for other orthopedic aftercare: Secondary | ICD-10-CM | POA: Diagnosis not present

## 2016-07-31 DIAGNOSIS — I10 Essential (primary) hypertension: Secondary | ICD-10-CM | POA: Diagnosis not present

## 2016-07-31 DIAGNOSIS — Z6841 Body Mass Index (BMI) 40.0 and over, adult: Secondary | ICD-10-CM | POA: Diagnosis not present

## 2016-07-31 DIAGNOSIS — E1140 Type 2 diabetes mellitus with diabetic neuropathy, unspecified: Secondary | ICD-10-CM | POA: Diagnosis not present

## 2016-07-31 DIAGNOSIS — M14672 Charcot's joint, left ankle and foot: Secondary | ICD-10-CM | POA: Diagnosis not present

## 2016-07-31 DIAGNOSIS — Z9181 History of falling: Secondary | ICD-10-CM | POA: Diagnosis not present

## 2016-08-04 DIAGNOSIS — N182 Chronic kidney disease, stage 2 (mild): Secondary | ICD-10-CM | POA: Diagnosis not present

## 2016-08-04 DIAGNOSIS — R809 Proteinuria, unspecified: Secondary | ICD-10-CM | POA: Diagnosis not present

## 2016-08-04 DIAGNOSIS — I10 Essential (primary) hypertension: Secondary | ICD-10-CM | POA: Diagnosis not present

## 2016-08-04 DIAGNOSIS — E875 Hyperkalemia: Secondary | ICD-10-CM | POA: Diagnosis not present

## 2016-08-05 DIAGNOSIS — Z6841 Body Mass Index (BMI) 40.0 and over, adult: Secondary | ICD-10-CM | POA: Diagnosis not present

## 2016-08-05 DIAGNOSIS — E1140 Type 2 diabetes mellitus with diabetic neuropathy, unspecified: Secondary | ICD-10-CM | POA: Diagnosis not present

## 2016-08-05 DIAGNOSIS — Z87891 Personal history of nicotine dependence: Secondary | ICD-10-CM | POA: Diagnosis not present

## 2016-08-05 DIAGNOSIS — Z4789 Encounter for other orthopedic aftercare: Secondary | ICD-10-CM | POA: Diagnosis not present

## 2016-08-05 DIAGNOSIS — Z9181 History of falling: Secondary | ICD-10-CM | POA: Diagnosis not present

## 2016-08-05 DIAGNOSIS — I10 Essential (primary) hypertension: Secondary | ICD-10-CM | POA: Diagnosis not present

## 2016-08-05 DIAGNOSIS — M1991 Primary osteoarthritis, unspecified site: Secondary | ICD-10-CM | POA: Diagnosis not present

## 2016-08-05 DIAGNOSIS — E11610 Type 2 diabetes mellitus with diabetic neuropathic arthropathy: Secondary | ICD-10-CM | POA: Diagnosis not present

## 2016-08-05 DIAGNOSIS — Z794 Long term (current) use of insulin: Secondary | ICD-10-CM | POA: Diagnosis not present

## 2016-08-10 DIAGNOSIS — Z9889 Other specified postprocedural states: Secondary | ICD-10-CM | POA: Diagnosis not present

## 2016-08-11 DIAGNOSIS — Z4789 Encounter for other orthopedic aftercare: Secondary | ICD-10-CM | POA: Diagnosis not present

## 2016-08-11 DIAGNOSIS — M1991 Primary osteoarthritis, unspecified site: Secondary | ICD-10-CM | POA: Diagnosis not present

## 2016-08-11 DIAGNOSIS — Z9181 History of falling: Secondary | ICD-10-CM | POA: Diagnosis not present

## 2016-08-11 DIAGNOSIS — Z6841 Body Mass Index (BMI) 40.0 and over, adult: Secondary | ICD-10-CM | POA: Diagnosis not present

## 2016-08-11 DIAGNOSIS — E11610 Type 2 diabetes mellitus with diabetic neuropathic arthropathy: Secondary | ICD-10-CM | POA: Diagnosis not present

## 2016-08-11 DIAGNOSIS — E1140 Type 2 diabetes mellitus with diabetic neuropathy, unspecified: Secondary | ICD-10-CM | POA: Diagnosis not present

## 2016-08-11 DIAGNOSIS — I10 Essential (primary) hypertension: Secondary | ICD-10-CM | POA: Diagnosis not present

## 2016-08-11 DIAGNOSIS — Z794 Long term (current) use of insulin: Secondary | ICD-10-CM | POA: Diagnosis not present

## 2016-08-11 DIAGNOSIS — Z87891 Personal history of nicotine dependence: Secondary | ICD-10-CM | POA: Diagnosis not present

## 2016-08-28 DIAGNOSIS — Z9889 Other specified postprocedural states: Secondary | ICD-10-CM | POA: Diagnosis not present

## 2016-08-28 DIAGNOSIS — Z981 Arthrodesis status: Secondary | ICD-10-CM | POA: Diagnosis not present

## 2016-08-28 DIAGNOSIS — Z4789 Encounter for other orthopedic aftercare: Secondary | ICD-10-CM | POA: Diagnosis not present

## 2016-09-08 DIAGNOSIS — E559 Vitamin D deficiency, unspecified: Secondary | ICD-10-CM | POA: Diagnosis not present

## 2016-09-08 DIAGNOSIS — I10 Essential (primary) hypertension: Secondary | ICD-10-CM | POA: Diagnosis not present

## 2016-09-08 DIAGNOSIS — Z79899 Other long term (current) drug therapy: Secondary | ICD-10-CM | POA: Diagnosis not present

## 2016-09-08 DIAGNOSIS — R809 Proteinuria, unspecified: Secondary | ICD-10-CM | POA: Diagnosis not present

## 2016-09-08 DIAGNOSIS — D509 Iron deficiency anemia, unspecified: Secondary | ICD-10-CM | POA: Diagnosis not present

## 2016-09-08 DIAGNOSIS — N183 Chronic kidney disease, stage 3 (moderate): Secondary | ICD-10-CM | POA: Diagnosis not present

## 2016-09-15 DIAGNOSIS — E875 Hyperkalemia: Secondary | ICD-10-CM | POA: Diagnosis not present

## 2016-09-15 DIAGNOSIS — R809 Proteinuria, unspecified: Secondary | ICD-10-CM | POA: Diagnosis not present

## 2016-09-15 DIAGNOSIS — I10 Essential (primary) hypertension: Secondary | ICD-10-CM | POA: Diagnosis not present

## 2016-09-15 DIAGNOSIS — N189 Chronic kidney disease, unspecified: Secondary | ICD-10-CM | POA: Diagnosis not present

## 2016-09-25 DIAGNOSIS — Z9889 Other specified postprocedural states: Secondary | ICD-10-CM | POA: Diagnosis not present

## 2016-09-25 DIAGNOSIS — Z981 Arthrodesis status: Secondary | ICD-10-CM | POA: Diagnosis not present

## 2016-10-16 DIAGNOSIS — M7989 Other specified soft tissue disorders: Secondary | ICD-10-CM | POA: Diagnosis not present

## 2016-10-16 DIAGNOSIS — Z9889 Other specified postprocedural states: Secondary | ICD-10-CM | POA: Diagnosis not present

## 2016-10-16 DIAGNOSIS — M19072 Primary osteoarthritis, left ankle and foot: Secondary | ICD-10-CM | POA: Diagnosis not present

## 2016-10-16 DIAGNOSIS — Z981 Arthrodesis status: Secondary | ICD-10-CM | POA: Diagnosis not present

## 2016-10-16 DIAGNOSIS — G63 Polyneuropathy in diseases classified elsewhere: Secondary | ICD-10-CM | POA: Diagnosis not present

## 2016-10-16 DIAGNOSIS — Z967 Presence of other bone and tendon implants: Secondary | ICD-10-CM | POA: Diagnosis not present

## 2016-10-21 DIAGNOSIS — E782 Mixed hyperlipidemia: Secondary | ICD-10-CM | POA: Diagnosis not present

## 2016-10-21 DIAGNOSIS — Z0000 Encounter for general adult medical examination without abnormal findings: Secondary | ICD-10-CM | POA: Diagnosis not present

## 2016-10-21 DIAGNOSIS — I10 Essential (primary) hypertension: Secondary | ICD-10-CM | POA: Diagnosis not present

## 2016-10-21 DIAGNOSIS — E1140 Type 2 diabetes mellitus with diabetic neuropathy, unspecified: Secondary | ICD-10-CM | POA: Diagnosis not present

## 2016-10-21 DIAGNOSIS — Z125 Encounter for screening for malignant neoplasm of prostate: Secondary | ICD-10-CM | POA: Diagnosis not present

## 2016-10-21 DIAGNOSIS — Z23 Encounter for immunization: Secondary | ICD-10-CM | POA: Diagnosis not present

## 2016-10-26 DIAGNOSIS — R262 Difficulty in walking, not elsewhere classified: Secondary | ICD-10-CM | POA: Diagnosis not present

## 2016-10-26 DIAGNOSIS — D72829 Elevated white blood cell count, unspecified: Secondary | ICD-10-CM | POA: Diagnosis not present

## 2016-10-26 DIAGNOSIS — M6281 Muscle weakness (generalized): Secondary | ICD-10-CM | POA: Diagnosis not present

## 2016-10-29 DIAGNOSIS — R262 Difficulty in walking, not elsewhere classified: Secondary | ICD-10-CM | POA: Diagnosis not present

## 2016-10-29 DIAGNOSIS — M6281 Muscle weakness (generalized): Secondary | ICD-10-CM | POA: Diagnosis not present

## 2016-11-02 DIAGNOSIS — M6281 Muscle weakness (generalized): Secondary | ICD-10-CM | POA: Diagnosis not present

## 2016-11-02 DIAGNOSIS — R262 Difficulty in walking, not elsewhere classified: Secondary | ICD-10-CM | POA: Diagnosis not present

## 2016-11-04 DIAGNOSIS — R262 Difficulty in walking, not elsewhere classified: Secondary | ICD-10-CM | POA: Diagnosis not present

## 2016-11-04 DIAGNOSIS — M6281 Muscle weakness (generalized): Secondary | ICD-10-CM | POA: Diagnosis not present

## 2016-11-06 DIAGNOSIS — E559 Vitamin D deficiency, unspecified: Secondary | ICD-10-CM | POA: Diagnosis not present

## 2016-11-06 DIAGNOSIS — R809 Proteinuria, unspecified: Secondary | ICD-10-CM | POA: Diagnosis not present

## 2016-11-06 DIAGNOSIS — D509 Iron deficiency anemia, unspecified: Secondary | ICD-10-CM | POA: Diagnosis not present

## 2016-11-06 DIAGNOSIS — N183 Chronic kidney disease, stage 3 (moderate): Secondary | ICD-10-CM | POA: Diagnosis not present

## 2016-11-10 DIAGNOSIS — E1129 Type 2 diabetes mellitus with other diabetic kidney complication: Secondary | ICD-10-CM | POA: Diagnosis not present

## 2016-11-10 DIAGNOSIS — R809 Proteinuria, unspecified: Secondary | ICD-10-CM | POA: Diagnosis not present

## 2016-11-10 DIAGNOSIS — I10 Essential (primary) hypertension: Secondary | ICD-10-CM | POA: Diagnosis not present

## 2016-11-10 DIAGNOSIS — E875 Hyperkalemia: Secondary | ICD-10-CM | POA: Diagnosis not present

## 2016-11-11 DIAGNOSIS — M6281 Muscle weakness (generalized): Secondary | ICD-10-CM | POA: Diagnosis not present

## 2016-11-11 DIAGNOSIS — R262 Difficulty in walking, not elsewhere classified: Secondary | ICD-10-CM | POA: Diagnosis not present

## 2016-11-13 DIAGNOSIS — E11610 Type 2 diabetes mellitus with diabetic neuropathic arthropathy: Secondary | ICD-10-CM | POA: Diagnosis not present

## 2016-11-13 DIAGNOSIS — M7989 Other specified soft tissue disorders: Secondary | ICD-10-CM | POA: Diagnosis not present

## 2016-11-13 DIAGNOSIS — I10 Essential (primary) hypertension: Secondary | ICD-10-CM | POA: Diagnosis not present

## 2016-11-13 DIAGNOSIS — Z967 Presence of other bone and tendon implants: Secondary | ICD-10-CM | POA: Diagnosis not present

## 2016-11-13 DIAGNOSIS — M14672 Charcot's joint, left ankle and foot: Secondary | ICD-10-CM | POA: Diagnosis not present

## 2016-11-13 DIAGNOSIS — G63 Polyneuropathy in diseases classified elsewhere: Secondary | ICD-10-CM | POA: Diagnosis not present

## 2016-11-13 DIAGNOSIS — M6281 Muscle weakness (generalized): Secondary | ICD-10-CM | POA: Diagnosis not present

## 2016-11-13 DIAGNOSIS — R262 Difficulty in walking, not elsewhere classified: Secondary | ICD-10-CM | POA: Diagnosis not present

## 2016-11-13 DIAGNOSIS — Z9889 Other specified postprocedural states: Secondary | ICD-10-CM | POA: Diagnosis not present

## 2016-11-16 DIAGNOSIS — R262 Difficulty in walking, not elsewhere classified: Secondary | ICD-10-CM | POA: Diagnosis not present

## 2016-11-16 DIAGNOSIS — M6281 Muscle weakness (generalized): Secondary | ICD-10-CM | POA: Diagnosis not present

## 2016-11-18 DIAGNOSIS — Z1211 Encounter for screening for malignant neoplasm of colon: Secondary | ICD-10-CM | POA: Diagnosis not present

## 2016-11-18 DIAGNOSIS — D126 Benign neoplasm of colon, unspecified: Secondary | ICD-10-CM | POA: Diagnosis not present

## 2016-11-18 DIAGNOSIS — K635 Polyp of colon: Secondary | ICD-10-CM | POA: Diagnosis not present

## 2016-11-19 DIAGNOSIS — R262 Difficulty in walking, not elsewhere classified: Secondary | ICD-10-CM | POA: Diagnosis not present

## 2016-11-19 DIAGNOSIS — M6281 Muscle weakness (generalized): Secondary | ICD-10-CM | POA: Diagnosis not present

## 2016-11-24 DIAGNOSIS — Z1211 Encounter for screening for malignant neoplasm of colon: Secondary | ICD-10-CM | POA: Diagnosis not present

## 2016-11-24 DIAGNOSIS — D126 Benign neoplasm of colon, unspecified: Secondary | ICD-10-CM | POA: Diagnosis not present

## 2016-11-24 DIAGNOSIS — R262 Difficulty in walking, not elsewhere classified: Secondary | ICD-10-CM | POA: Diagnosis not present

## 2016-11-24 DIAGNOSIS — M6281 Muscle weakness (generalized): Secondary | ICD-10-CM | POA: Diagnosis not present

## 2016-11-24 DIAGNOSIS — K635 Polyp of colon: Secondary | ICD-10-CM | POA: Diagnosis not present

## 2016-11-26 DIAGNOSIS — M6281 Muscle weakness (generalized): Secondary | ICD-10-CM | POA: Diagnosis not present

## 2016-11-26 DIAGNOSIS — R262 Difficulty in walking, not elsewhere classified: Secondary | ICD-10-CM | POA: Diagnosis not present

## 2016-11-27 ENCOUNTER — Other Ambulatory Visit (HOSPITAL_COMMUNITY): Payer: Self-pay | Admitting: Nephrology

## 2016-11-27 DIAGNOSIS — R809 Proteinuria, unspecified: Secondary | ICD-10-CM

## 2016-12-01 DIAGNOSIS — R262 Difficulty in walking, not elsewhere classified: Secondary | ICD-10-CM | POA: Diagnosis not present

## 2016-12-01 DIAGNOSIS — M6281 Muscle weakness (generalized): Secondary | ICD-10-CM | POA: Diagnosis not present

## 2016-12-02 ENCOUNTER — Ambulatory Visit (HOSPITAL_COMMUNITY): Payer: BLUE CROSS/BLUE SHIELD

## 2016-12-02 ENCOUNTER — Other Ambulatory Visit: Payer: Self-pay | Admitting: Radiology

## 2016-12-03 DIAGNOSIS — M6281 Muscle weakness (generalized): Secondary | ICD-10-CM | POA: Diagnosis not present

## 2016-12-03 DIAGNOSIS — R262 Difficulty in walking, not elsewhere classified: Secondary | ICD-10-CM | POA: Diagnosis not present

## 2016-12-04 ENCOUNTER — Ambulatory Visit (HOSPITAL_COMMUNITY)
Admission: RE | Admit: 2016-12-04 | Discharge: 2016-12-04 | Disposition: A | Discharge status: Home / Self Care | Payer: BLUE CROSS/BLUE SHIELD | Source: Ambulatory Visit | Attending: Nephrology | Admitting: Nephrology

## 2016-12-04 ENCOUNTER — Encounter (HOSPITAL_COMMUNITY): Payer: Self-pay

## 2016-12-04 DIAGNOSIS — Z87891 Personal history of nicotine dependence: Secondary | ICD-10-CM | POA: Insufficient documentation

## 2016-12-04 DIAGNOSIS — N189 Chronic kidney disease, unspecified: Secondary | ICD-10-CM | POA: Diagnosis not present

## 2016-12-04 DIAGNOSIS — R809 Proteinuria, unspecified: Secondary | ICD-10-CM | POA: Diagnosis not present

## 2016-12-04 DIAGNOSIS — Z7984 Long term (current) use of oral hypoglycemic drugs: Secondary | ICD-10-CM | POA: Insufficient documentation

## 2016-12-04 DIAGNOSIS — E7800 Pure hypercholesterolemia, unspecified: Secondary | ICD-10-CM | POA: Diagnosis not present

## 2016-12-04 DIAGNOSIS — Z79899 Other long term (current) drug therapy: Secondary | ICD-10-CM | POA: Diagnosis not present

## 2016-12-04 DIAGNOSIS — I129 Hypertensive chronic kidney disease with stage 1 through stage 4 chronic kidney disease, or unspecified chronic kidney disease: Secondary | ICD-10-CM | POA: Diagnosis not present

## 2016-12-04 DIAGNOSIS — Z8249 Family history of ischemic heart disease and other diseases of the circulatory system: Secondary | ICD-10-CM | POA: Insufficient documentation

## 2016-12-04 DIAGNOSIS — E1122 Type 2 diabetes mellitus with diabetic chronic kidney disease: Secondary | ICD-10-CM | POA: Diagnosis not present

## 2016-12-04 DIAGNOSIS — E1121 Type 2 diabetes mellitus with diabetic nephropathy: Secondary | ICD-10-CM | POA: Insufficient documentation

## 2016-12-04 DIAGNOSIS — E785 Hyperlipidemia, unspecified: Secondary | ICD-10-CM | POA: Insufficient documentation

## 2016-12-04 HISTORY — DX: Chronic kidney disease, unspecified: N18.9

## 2016-12-04 HISTORY — DX: Essential (primary) hypertension: I10

## 2016-12-04 HISTORY — DX: Pure hypercholesterolemia, unspecified: E78.00

## 2016-12-04 LAB — PROTIME-INR
INR: 1.1
Prothrombin Time: 14.1 seconds (ref 11.4–15.2)

## 2016-12-04 LAB — CBC
HEMATOCRIT: 38.4 % — AB (ref 39.0–52.0)
HEMOGLOBIN: 12.4 g/dL — AB (ref 13.0–17.0)
MCH: 25.9 pg — ABNORMAL LOW (ref 26.0–34.0)
MCHC: 32.3 g/dL (ref 30.0–36.0)
MCV: 80.3 fL (ref 78.0–100.0)
Platelets: 359 10*3/uL (ref 150–400)
RBC: 4.78 MIL/uL (ref 4.22–5.81)
RDW: 14.8 % (ref 11.5–15.5)
WBC: 11.6 10*3/uL — ABNORMAL HIGH (ref 4.0–10.5)

## 2016-12-04 LAB — APTT: APTT: 33 s (ref 24–36)

## 2016-12-04 LAB — GLUCOSE, CAPILLARY
GLUCOSE-CAPILLARY: 144 mg/dL — AB (ref 65–99)
Glucose-Capillary: 95 mg/dL (ref 65–99)

## 2016-12-04 MED ORDER — SODIUM CHLORIDE 0.9 % IV SOLN: INTRAVENOUS | Status: DC

## 2016-12-04 MED ORDER — LIDOCAINE HCL (PF) 1 % IJ SOLN
INTRAMUSCULAR | Status: AC
  Filled 2016-12-04: qty 10

## 2016-12-04 MED ORDER — FENTANYL CITRATE (PF) 100 MCG/2ML IJ SOLN
INTRAMUSCULAR | Status: AC
  Filled 2016-12-04: qty 2

## 2016-12-04 MED ORDER — MIDAZOLAM HCL 2 MG/2ML IJ SOLN
INTRAMUSCULAR | Status: AC
  Filled 2016-12-04: qty 2

## 2016-12-04 MED ORDER — MIDAZOLAM HCL 2 MG/2ML IJ SOLN
INTRAMUSCULAR | Status: AC | PRN
  Administered 2016-12-04: 2 mg via INTRAVENOUS

## 2016-12-04 MED ORDER — FENTANYL CITRATE (PF) 100 MCG/2ML IJ SOLN
INTRAMUSCULAR | Status: AC | PRN
  Administered 2016-12-04 (×2): 50 ug via INTRAVENOUS

## 2016-12-04 MED ORDER — HYDROCODONE-ACETAMINOPHEN 5-325 MG PO TABS: 1.0000 | ORAL_TABLET | ORAL | Status: DC | PRN

## 2016-12-04 NOTE — Sedation Documentation (Signed)
Patient is resting comfortably. 

## 2016-12-04 NOTE — Procedures (Signed)
Interventional Radiology Procedure Note  Procedure: US guided biopsy of right kidney  Complications: None  Estimated Blood Loss: < 10 mL  US guided 16 G core biopsy x 2 of right renal cortex.  Jodi Marble. Fredia Sorrow, M.D Pager:  [phone]

## 2016-12-04 NOTE — H&P (Signed)
Chief Complaint: Patient was seen in consultation today for proteinuria  Referring Physician(s): Salomon Mast  Supervising Physician: Irish Lack  Patient Status: [facility] - Out-pt  History of Present Illness: Reginald Carpenter is a 51 y.o. male with past medical history of DM, neuropathy, HLD, HTN, and chronic kidney disease who presents to the radiology department today for random renal biopsy at the request of Dr. Kristian Covey.   Patient presents today in his usual state of health and without complaint.   He has been NPO.  He does not take blood thinners.   Past Medical History:  Diagnosis Date  . Chronic kidney disease   . Diabetes mellitus without complication (HCC)   . High cholesterol   . Hypertension     Past Surgical History:  Procedure Laterality Date  . ANKLE SURGERY      Allergies: Patient has no known allergies.  Medications: Prior to Admission medications   Medication Sig Start Date End Date Taking? Authorizing Provider  amLODipine (NORVASC) 5 MG tablet Take 5 mg by mouth daily. 10/13/16  Yes [provider]  atorvastatin (LIPITOR) 80 MG tablet Take 80 mg by mouth every morning.   Yes [provider]  glimepiride (AMARYL) 2 MG tablet Take 2 mg by mouth every evening.   Yes [provider]  lisinopril-hydrochlorothiazide (PRINZIDE,ZESTORETIC) 20-12.5 MG tablet Take 2 tablets by mouth daily.   Yes [provider]  metFORMIN (GLUCOPHAGE) 1000 MG tablet Take 1,000 mg by mouth 2 (two) times daily with a meal. 11/24/16  Yes [provider]  pregabalin (LYRICA) 50 MG capsule Take 50 mg by mouth 2 (two) times daily.   Yes [provider]     Family History  Problem Relation Age of Onset  . Heart failure Mother     Social History   Social History  . Marital status: Married    Spouse name: N/A  . Number of children: N/A  . Years of education: N/A   Social History Main Topics  . Smoking status: Former  Smoker    Years: 10.00  . Smokeless tobacco: Never Used     Comment: stopped 25 yrs ago  . Alcohol use No  . Drug use: No  . Sexual activity: Not Asked   Other Topics Concern  . None   Social History Narrative  . None     Review of Systems  Constitutional: Negative for fatigue and fever.  Respiratory: Negative for cough and shortness of breath.   Cardiovascular: Negative for chest pain.  Gastrointestinal: Negative for abdominal pain.  Musculoskeletal: Negative for back pain.  Psychiatric/Behavioral: Negative for behavioral problems and confusion.    Vital Signs: BP (!) 156/89   Pulse 80   Temp 98.3 F (36.8 C) (Oral)   Ht 6' (1.829 m)   Wt 295 lb (133.8 kg)   SpO2 98%   BMI 40.01 kg/m   Physical Exam  Constitutional: He is oriented to person, place, and time. He appears well-developed.  Cardiovascular: Normal rate, regular rhythm and normal heart sounds.   Pulmonary/Chest: Effort normal and breath sounds normal. No respiratory distress.  Abdominal: Soft.  Musculoskeletal:  Scar to left back  Neurological: He is alert and oriented to person, place, and time.  Skin: Skin is warm and dry.  Psychiatric: He has a normal mood and affect. His behavior is normal. Judgment and thought content normal.  Nursing note and vitals reviewed.   Imaging: No results found.  Labs:  CBC:  Recent Labs  12/04/16 0543  WBC 11.6*  HGB 12.4*  HCT 38.4*  PLT 359    COAGS:  Recent Labs  12/04/16 0543  INR 1.10  APTT 33    BMP: No results for input(s): NA, K, CL, CO2, GLUCOSE, BUN, CALCIUM, CREATININE, GFRNONAA, GFRAA in the last 8760 hours.  Invalid input(s): CMP  LIVER FUNCTION TESTS: No results for input(s): BILITOT, AST, ALT, ALKPHOS, PROT, ALBUMIN in the last 8760 hours.  TUMOR MARKERS: No results for input(s): AFPTM, CEA, CA199, CHROMGRNA in the last 8760 hours.  Assessment and Plan: Patient with past medical history of proteinuria presents for random  renal biopsy at the request of Dr. Kristian Covey. Patient presents today in their usual state of health.  He has been NPO and is not currently on blood thinners.  He has a scar to his left back from an injury several years ago.  Risks and benefits discussed with the patient including, but not limited to bleeding, infection, damage to adjacent structures or low yield requiring additional tests. All of the patient's questions were answered, patient is agreeable to proceed. Consent signed and in chart.  Thank you for this interesting consult.  I greatly enjoyed meeting Kouper Spinella and look forward to participating in their care.  A copy of this report was sent to the requesting provider on this date.  Electronically Signed: Hoyt Koch, PA 12/04/2016, 7:31 AM   I spent a total of  30 Minutes   in face to face in clinical consultation, greater than 50% of which was counseling/coordinating care for proteinuria

## 2016-12-04 NOTE — Sedation Documentation (Signed)
Patient denies pain and is resting comfortably.  

## 2016-12-04 NOTE — Discharge Instructions (Addendum)

## 2016-12-07 DIAGNOSIS — M6281 Muscle weakness (generalized): Secondary | ICD-10-CM | POA: Diagnosis not present

## 2016-12-07 DIAGNOSIS — R262 Difficulty in walking, not elsewhere classified: Secondary | ICD-10-CM | POA: Diagnosis not present

## 2016-12-10 ENCOUNTER — Encounter: Payer: Self-pay | Admitting: Internal Medicine

## 2016-12-10 ENCOUNTER — Ambulatory Visit (INDEPENDENT_AMBULATORY_CARE_PROVIDER_SITE_OTHER): Payer: BLUE CROSS/BLUE SHIELD | Admitting: Internal Medicine

## 2016-12-10 VITALS — BP 142/80 | HR 89 | Ht 71.0 in | Wt 306.0 lb

## 2016-12-10 DIAGNOSIS — E1142 Type 2 diabetes mellitus with diabetic polyneuropathy: Secondary | ICD-10-CM

## 2016-12-10 DIAGNOSIS — E1165 Type 2 diabetes mellitus with hyperglycemia: Secondary | ICD-10-CM

## 2016-12-10 DIAGNOSIS — E1122 Type 2 diabetes mellitus with diabetic chronic kidney disease: Secondary | ICD-10-CM | POA: Insufficient documentation

## 2016-12-10 NOTE — Patient Instructions (Addendum)
Please continue: - Metformin 1000 mg 2x a day - Amaryl 2 mg before dinner  Please return in 1.5 months with your sugar log.   Please let me know if the sugars are consistently <80 or >200.  PATIENT INSTRUCTIONS FOR TYPE 2 DIABETES:  **Please join MyChart!** - see attached instructions about how to join if you have not done so already.  DIET AND EXERCISE Diet and exercise is an important part of diabetic treatment.  We recommended aerobic exercise in the form of brisk walking (working between 40-60% of maximal aerobic capacity, similar to brisk walking) for 150 minutes per week (such as 30 minutes five days per week) along with 3 times per week performing 'resistance' training (using various gauge rubber tubes with handles) 5-10 exercises involving the major muscle groups (upper body, lower body and core) performing 10-15 repetitions (or near fatigue) each exercise. Start at half the above goal but build slowly to reach the above goals. If limited by weight, joint pain, or disability, we recommend daily walking in a swimming pool with water up to waist to reduce pressure from joints while allow for adequate exercise.    BLOOD GLUCOSES Monitoring your blood glucoses is important for continued management of your diabetes. Please check your blood glucoses 2-4 times a day: fasting, before meals and at bedtime (you can rotate these measurements - e.g. one day check before the 3 meals, the next day check before 2 of the meals and before bedtime, etc.).   HYPOGLYCEMIA (low blood sugar) Hypoglycemia is usually a reaction to not eating, exercising, or taking too much insulin/ other diabetes drugs.  Symptoms include tremors, sweating, hunger, confusion, headache, etc. Treat IMMEDIATELY with 15 grams of Carbs: . 4 glucose tablets .  cup regular juice/soda . 2 tablespoons raisins . 4 teaspoons sugar . 1 tablespoon honey Recheck blood glucose in 15 mins and repeat above if still symptomatic/blood  glucose <100.  RECOMMENDATIONS TO REDUCE YOUR RISK OF DIABETIC COMPLICATIONS: * Take your prescribed MEDICATION(S) * Follow a DIABETIC diet: Complex carbs, fiber rich foods, (monounsaturated and polyunsaturated) fats * AVOID saturated/trans fats, high fat foods, >2,300 mg salt per day. * EXERCISE at least 5 times a week for 30 minutes or preferably daily.  * DO NOT SMOKE OR DRINK more than 1 drink a day. * Check your FEET every day. Do not wear tightfitting shoes. Contact us if you develop an ulcer * See your EYE doctor once a year or more if needed * Get a FLU shot once a year * Get a PNEUMONIA vaccine once before and once after age 61 years  GOALS:  * Your Hemoglobin A1c of <7%  * fasting sugars need to be <130 * after meals sugars need to be <180 (2h after you start eating) * Your Systolic BP should be 140 or lower  * Your Diastolic BP should be 80 or lower  * Your HDL (Good Cholesterol) should be 40 or higher  * Your LDL (Bad Cholesterol) should be 100 or lower. * Your Triglycerides should be 150 or lower  * Your Urine microalbumin (kidney function) should be <30 * Your Body Mass Index should be 25 or lower    Please consider the following ways to cut down carbs and fat and increase fiber and micronutrients in your diet: - substitute whole grain for white bread or pasta - substitute brown rice for white rice - substitute 90-calorie flat bread pieces for slices of bread when possible - substitute sweet  potatoes or yams for white potatoes - substitute humus for margarine - substitute tofu for cheese when possible - substitute almond or rice milk for regular milk (would not drink soy milk daily due to concern for soy estrogen influence on breast cancer risk) - substitute dark chocolate for other sweets when possible - substitute water - can add lemon or orange slices for taste - for diet sodas (artificial sweeteners will trick your body that you can eat sweets without getting  calories and will lead you to overeating and weight gain in the long run) - do not skip breakfast or other meals (this will slow down the metabolism and will result in more weight gain over time)  - can try smoothies made from fruit and almond/rice milk in am instead of regular breakfast - can also try old-fashioned (not instant) oatmeal made with almond/rice milk in am - order the dressing on the side when eating salad at a restaurant (pour less than half of the dressing on the salad) - eat as little meat as possible - can try juicing, but should not forget that juicing will get rid of the fiber, so would alternate with eating raw veg./fruits or drinking smoothies - use as little oil as possible, even when using olive oil - can dress a salad with a mix of balsamic vinegar and lemon juice, for e.g. - use agave nectar, stevia sugar, or regular sugar rather than artificial sweateners - steam or broil/roast veggies  - snack on veggies/fruit/nuts (unsalted, preferably) when possible, rather than processed foods - reduce or eliminate aspartame in diet (it is in diet sodas, chewing gum, etc) Read the labels!  Try to read Dr. Katherina Right book: "Program for Reversing Diabetes" for other ideas for healthy eating. Please look up "The Engine 2 diet" by Juanetta Beets.

## 2016-12-10 NOTE — Progress Notes (Signed)
Patient ID: Reginald Carpenter, male   DOB: Jun 11, 1965, 51 y.o.   MRN: [id number]   HPI: Reginald Carpenter is a 51 y.o.-year-old male, referred by his PCP, Reginald Soho, PA, for management of DM2, dx in ~2000, prev. insulin-dependent since 2017, uncontrolled, with complications (CKD, PN - + Charcot foot L ankle).  Last hemoglobin A1c was: 10/22/2016: HbA1c 9.2% No results found for: HGBA1C  Pt is on a regimen of: - Metformin 1000 mg 2x a day, with meals - Amaryl 2 mg before dinner - started 10/2016 He was on Basaglar 26 units at bedtime, but stopped 10/2016 after adding Amaryl  Pt checks his sugars 1-2x a day and they are: - am: 130-140 - 2h after b'fast: n/c - before lunch: n/c - 2h after lunch: n/c - before dinner: 98-190 - 2h after dinner: n/c - bedtime: n/c - nighttime: n/c No lows. Lowest sugar was 98; ? hypoglycemia awareness.  Highest sugar was 235.  Glucometer: [employer]  Pt's meals are: - Breakfast: cereal; sausage and eggs - Lunch: ham and cheese sandwich, salads, pot pie - Dinner: salad, spaghetti, grilled chicken and steamed veggies - Snacks: cottage cheese He stopped regular sodas - at the end of last year.  - + CKD, last BUN/creatinine: 10/22/2016: 44/1.84, GFR 39, glucose 182  No results found for: BUN, CREATININE  On Lisinopril 20 - last set of lipids: 10/22/2016: 208/236/45/115 No results found for: CHOL, HDL, LDLCALC, LDLDIRECT, TRIG, CHOLHDL  On Lipitor 40 - last eye exam was in 2017. ? DR.  - + numbness and tingling in his feet.  Pt has FH of DM in mother, sister (GDM).  He also has HTN, HL.  ROS: Constitutional: no weight gain/loss, no fatigue, no subjective hyperthermia/hypothermia Eyes: no blurry vision, no xerophthalmia ENT: no sore throat, no nodules palpated in throat, no dysphagia/odynophagia, no hoarseness Cardiovascular: no CP/SOB/palpitations/+ leg swelling Respiratory: no cough/SOB Gastrointestinal: no N/V/D/C Musculoskeletal: + muscle/+  joint aches Skin: no rashes Neurological: no tremors/numbness/tingling/dizziness Psychiatric: no depression/anxiety  Past Medical History:  Diagnosis Date  . Chronic kidney disease   . Diabetes mellitus without complication (HCC)   . High cholesterol   . Hypertension    Past Surgical History:  Procedure Laterality Date  . ANKLE SURGERY     Social History   Social History  . Marital status: Married    Spouse name: N/A  . Number of children: 2   Occupational History  . Out of work   Social History Main Topics  . Smoking status: Former Smoker - quit 2004    Years: 10.00  . Smokeless tobacco: Never Used  . Alcohol use No  . Drug use: No   Current Outpatient Prescriptions on File Prior to Visit  Medication Sig Dispense Refill  . amLODipine (NORVASC) 5 MG tablet Take 5 mg by mouth daily.  3  . atorvastatin (LIPITOR) 80 MG tablet Take 80 mg by mouth every morning.    Marland Kitchen glimepiride (AMARYL) 2 MG tablet Take 2 mg by mouth every evening.    Marland Kitchen lisinopril-hydrochlorothiazide (PRINZIDE,ZESTORETIC) 20-12.5 MG tablet Take 2 tablets by mouth daily.    . metFORMIN (GLUCOPHAGE) 1000 MG tablet Take 1,000 mg by mouth 2 (two) times daily with a meal.  0  . pregabalin (LYRICA) 50 MG capsule Take 50 mg by mouth 2 (two) times daily.     No current facility-administered medications on file prior to visit.    No Known Allergies Family History  Problem Relation Age of Onset  .  Heart failure Mother    Also; HTN, HL, heart ds, lung CA in Mother PE: BP (!) 142/80 (BP Location: Left Arm, Patient Position: Sitting)   Pulse 89   Ht  (1.803 m)   Wt (!) 306 lb (138.8 kg)   SpO2 98%   BMI 42.68 kg/m  Wt Readings from Last 3 Encounters:  12/10/16 (!) 306 lb (138.8 kg)  12/04/16 295 lb (133.8 kg)  06/07/13 (!) 301 lb 9.6 oz (136.8 kg)   Constitutional: obese, in NAD Eyes: PERRLA, EOMI, no exophthalmos ENT: moist mucous membranes, no thyromegaly, no cervical  lymphadenopathy Cardiovascular: RRR, No MRG Respiratory: CTA B Gastrointestinal: abdomen soft, NT, ND, BS+ Musculoskeletal: no deformities, strength intact in all 4 Skin: moist, warm, no rashes Neurological: no tremor with outstretched hands, DTR normal in all 4  ASSESSMENT: 1. DM2, insulin-dependent, uncontrolled, with complications - PN - CKD with proteinuria  - sees nephrology. 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  PLAN:  1. Patient with long-standing, uncontrolled diabetes, on oral antidiabetic regimen, with improved control based on his home CBG checks compared with 10/2016 when his HbA1c was 9.2%. -  We discussed about improving his diet >> reduce saturated fat, concentrated sweets (no sodas!). I emphasized the need to improve his diet not only to improve his DM and weight but also kidney fxn. - OTW, will not make changes in his regimen, but will suggest to RTC in 1.5 mo to check a new HbA1c and review his log - I suggested to:  Patient Instructions  Please continue: - Metformin 1000 mg 2x a day - Amaryl 2 mg before dinner  Please return in 1.5 months with your sugar log.   Please let me know if the sugars are consistently <80 or >200.  - Strongly advised him to start checking sugars at different times of the day - check 2 times a day, rotating checks - given sugar log and advised how to fill it and to bring it at next appt  - given foot care handout and explained the principles  - given instructions for hypoglycemia management "15-15 rule"  - advised for yearly eye exams  - Return to clinic in 1.5 mo with sugar log   Carlus Pavlov, MD PhD [facility] End[facility]

## 2016-12-11 DIAGNOSIS — M6281 Muscle weakness (generalized): Secondary | ICD-10-CM | POA: Diagnosis not present

## 2016-12-11 DIAGNOSIS — R262 Difficulty in walking, not elsewhere classified: Secondary | ICD-10-CM | POA: Diagnosis not present

## 2016-12-14 ENCOUNTER — Encounter (HOSPITAL_COMMUNITY): Payer: Self-pay

## 2016-12-14 DIAGNOSIS — Z9889 Other specified postprocedural states: Secondary | ICD-10-CM | POA: Diagnosis not present

## 2016-12-14 DIAGNOSIS — Z4789 Encounter for other orthopedic aftercare: Secondary | ICD-10-CM | POA: Diagnosis not present

## 2016-12-14 DIAGNOSIS — I10 Essential (primary) hypertension: Secondary | ICD-10-CM | POA: Diagnosis not present

## 2016-12-14 DIAGNOSIS — E11610 Type 2 diabetes mellitus with diabetic neuropathic arthropathy: Secondary | ICD-10-CM | POA: Diagnosis not present

## 2016-12-14 DIAGNOSIS — R600 Localized edema: Secondary | ICD-10-CM | POA: Diagnosis not present

## 2016-12-17 ENCOUNTER — Encounter (HOSPITAL_COMMUNITY): Payer: Self-pay

## 2016-12-17 DIAGNOSIS — E559 Vitamin D deficiency, unspecified: Secondary | ICD-10-CM | POA: Diagnosis not present

## 2016-12-17 DIAGNOSIS — I10 Essential (primary) hypertension: Secondary | ICD-10-CM | POA: Diagnosis not present

## 2016-12-17 DIAGNOSIS — R809 Proteinuria, unspecified: Secondary | ICD-10-CM | POA: Diagnosis not present

## 2016-12-17 DIAGNOSIS — D509 Iron deficiency anemia, unspecified: Secondary | ICD-10-CM | POA: Diagnosis not present

## 2016-12-17 DIAGNOSIS — N183 Chronic kidney disease, stage 3 (moderate): Secondary | ICD-10-CM | POA: Diagnosis not present

## 2016-12-17 DIAGNOSIS — Z79899 Other long term (current) drug therapy: Secondary | ICD-10-CM | POA: Diagnosis not present

## 2016-12-21 DIAGNOSIS — M6281 Muscle weakness (generalized): Secondary | ICD-10-CM | POA: Diagnosis not present

## 2016-12-21 DIAGNOSIS — R262 Difficulty in walking, not elsewhere classified: Secondary | ICD-10-CM | POA: Diagnosis not present

## 2016-12-24 DIAGNOSIS — R262 Difficulty in walking, not elsewhere classified: Secondary | ICD-10-CM | POA: Diagnosis not present

## 2016-12-24 DIAGNOSIS — M6281 Muscle weakness (generalized): Secondary | ICD-10-CM | POA: Diagnosis not present

## 2016-12-28 DIAGNOSIS — M6281 Muscle weakness (generalized): Secondary | ICD-10-CM | POA: Diagnosis not present

## 2016-12-28 DIAGNOSIS — R262 Difficulty in walking, not elsewhere classified: Secondary | ICD-10-CM | POA: Diagnosis not present

## 2016-12-31 DIAGNOSIS — R262 Difficulty in walking, not elsewhere classified: Secondary | ICD-10-CM | POA: Diagnosis not present

## 2016-12-31 DIAGNOSIS — M6281 Muscle weakness (generalized): Secondary | ICD-10-CM | POA: Diagnosis not present

## 2017-01-04 DIAGNOSIS — R262 Difficulty in walking, not elsewhere classified: Secondary | ICD-10-CM | POA: Diagnosis not present

## 2017-01-04 DIAGNOSIS — M6281 Muscle weakness (generalized): Secondary | ICD-10-CM | POA: Diagnosis not present

## 2017-01-05 DIAGNOSIS — E875 Hyperkalemia: Secondary | ICD-10-CM | POA: Diagnosis not present

## 2017-01-07 DIAGNOSIS — R262 Difficulty in walking, not elsewhere classified: Secondary | ICD-10-CM | POA: Diagnosis not present

## 2017-01-07 DIAGNOSIS — M6281 Muscle weakness (generalized): Secondary | ICD-10-CM | POA: Diagnosis not present

## 2017-01-11 DIAGNOSIS — Z9889 Other specified postprocedural states: Secondary | ICD-10-CM | POA: Diagnosis not present

## 2017-01-11 DIAGNOSIS — M14672 Charcot's joint, left ankle and foot: Secondary | ICD-10-CM | POA: Diagnosis not present

## 2017-01-11 DIAGNOSIS — G63 Polyneuropathy in diseases classified elsewhere: Secondary | ICD-10-CM | POA: Diagnosis not present

## 2017-01-11 DIAGNOSIS — I10 Essential (primary) hypertension: Secondary | ICD-10-CM | POA: Diagnosis not present

## 2017-01-11 DIAGNOSIS — E11610 Type 2 diabetes mellitus with diabetic neuropathic arthropathy: Secondary | ICD-10-CM | POA: Diagnosis not present

## 2017-01-11 DIAGNOSIS — R600 Localized edema: Secondary | ICD-10-CM | POA: Diagnosis not present

## 2017-01-11 DIAGNOSIS — M19072 Primary osteoarthritis, left ankle and foot: Secondary | ICD-10-CM | POA: Diagnosis not present

## 2017-01-15 DIAGNOSIS — R262 Difficulty in walking, not elsewhere classified: Secondary | ICD-10-CM | POA: Diagnosis not present

## 2017-01-15 DIAGNOSIS — M6281 Muscle weakness (generalized): Secondary | ICD-10-CM | POA: Diagnosis not present

## 2017-01-19 DIAGNOSIS — M6281 Muscle weakness (generalized): Secondary | ICD-10-CM | POA: Diagnosis not present

## 2017-01-19 DIAGNOSIS — R262 Difficulty in walking, not elsewhere classified: Secondary | ICD-10-CM | POA: Diagnosis not present

## 2017-01-21 ENCOUNTER — Ambulatory Visit (INDEPENDENT_AMBULATORY_CARE_PROVIDER_SITE_OTHER): Payer: BLUE CROSS/BLUE SHIELD | Admitting: Internal Medicine

## 2017-01-21 ENCOUNTER — Encounter: Payer: Self-pay | Admitting: Internal Medicine

## 2017-01-21 VITALS — BP 138/78 | HR 88 | Ht 71.0 in | Wt 306.0 lb

## 2017-01-21 DIAGNOSIS — E785 Hyperlipidemia, unspecified: Secondary | ICD-10-CM

## 2017-01-21 DIAGNOSIS — E1122 Type 2 diabetes mellitus with diabetic chronic kidney disease: Secondary | ICD-10-CM

## 2017-01-21 LAB — HEMOGLOBIN A1C: Hemoglobin A1C: 6

## 2017-01-21 NOTE — Progress Notes (Signed)
Patient ID: Reginald Carpenter, male   DOB: 03/22/1965, 51 y.o.   MRN: [id number]   HPI: Reginald CrapeBilly Mineer is a 51 y.o.-year-old male, initially referred by his PCP, Jarrett Sohoourtney Wharton, PA, returning for f/u for DM2, dx in ~2000, prev. insulin-dependent since 2017, uncontrolled, with complications (CKD, PN - + Charcot foot L ankle). Last visit 1.5 mo ago.  He switched to a more plant-based diet, lean meats, lower portions after our discussion last visit.  Sugars decreased significantly since then.  He now developed lows throughout the day.  Last hemoglobin A1c was: 10/22/2016: HbA1c 9.2% No results found for: HGBA1C  Pt is on a regimen of: - Metformin 1000 mg 2x a day, with meals >> no SEs - Amaryl 2 mg before dinner - started 10/2016 He was on Basaglar 26 units at bedtime, but stopped 10/2016 after adding Amaryl  Pt checks his sugars 1-2x a day: - am: 130-140 >> 53-103 - 2h after b'fast: n/c - before lunch: n/c >> 62-103 - 2h after lunch: n/c - before dinner: 98-190 >> 65-114, 133 - 2h after dinner: n/c >> 126-143 - bedtime: n/c >> 62-120 - nighttime: n/c + lows. Lowest sugar was 98 >> 53; hypoglycemia awareness at 60s. Highest sugar was 235 >> 153.  Glucometer: [employer]  Pt's meals are: - Breakfast: cereal; sausage and eggs - Lunch: ham and cheese sandwich, salads, pot pie - Dinner: salad, spaghetti, grilled chicken and steamed veggies - Snacks: cottage cheese He stopped regular sodas - at the end of last year.  - + CKD, last BUN/creatinine: 10/22/2016: 44/1.84, GFR 39, glucose 182  No results found for: BUN, CREATININE  On Lisinopril 20. - + HL; last set of lipids: 10/22/2016: 208/236/45/115 No results found for: CHOL, HDL, LDLCALC, LDLDIRECT, TRIG, CHOLHDL  On Lipitor 40. - last eye exam was in 2017 >> ? DR - he has numbness and tingling in his feet.  Pt has FH of DM in mother, sister (GDM).  He also has HTN, HL.  ROS: Constitutional: no weight gain/no weight loss, no  fatigue, no subjective hyperthermia, no subjective hypothermia Eyes: no blurry vision, no xerophthalmia ENT: no sore throat, no nodules palpated in throat, no dysphagia, no odynophagia, no hoarseness Cardiovascular: no CP/no SOB/no palpitations/no leg swelling Respiratory: no cough/no SOB/no wheezing Gastrointestinal: no N/no V/no D/no C/no acid reflux Musculoskeletal: no muscle aches/no joint aches Skin: no rashes, no hair loss Neurological: no tremors/no numbness/no tingling/no dizziness  I reviewed pt's medications, allergies, PMH, social hx, family hx, and changes were documented in the history of present illness. Otherwise, unchanged from my initial visit note. On [place] now.  Past Medical History:  Diagnosis Date  . Chronic kidney disease   . Diabetes mellitus without complication (HCC)   . High cholesterol   . Hypertension    Past Surgical History:  Procedure Laterality Date  . ANKLE SURGERY     Social History   Social History  . Marital status: Married    Spouse name: N/A  . Number of children: 2   Occupational History  . Out of work   Social History Main Topics  . Smoking status: Former Smoker - quit 2004    Years: 10.00  . Smokeless tobacco: Never Used  . Alcohol use No  . Drug use: No   Current Outpatient Medications on File Prior to Visit  Medication Sig Dispense Refill  . amLODipine (NORVASC) 5 MG tablet Take 5 mg by mouth daily.  3  . atorvastatin (LIPITOR) 80  MG tablet Take 80 mg by mouth every morning.    Marland Kitchen glimepiride (AMARYL) 2 MG tablet Take 2 mg by mouth every evening.    Marland Kitchen lisinopril-hydrochlorothiazide (PRINZIDE,ZESTORETIC) 20-12.5 MG tablet Take 2 tablets by mouth daily.    . metFORMIN (GLUCOPHAGE) 1000 MG tablet Take 1,000 mg by mouth 2 (two) times daily with a meal.  0  . pregabalin (LYRICA) 50 MG capsule Take 50 mg by mouth 2 (two) times daily.    . VELTASSA 8.4 g packet MIX AND DIS 1 PACKET IN WATER AND DRINK PO ONCE D.  6   No current  facility-administered medications on file prior to visit.    No Known Allergies Family History  Problem Relation Age of Onset  . Heart failure Mother    Also; HTN, HL, heart ds, lung CA in Mother  PE: BP 138/78 (BP Location: Right Arm, Patient Position: Sitting, Cuff Size: Large)   Pulse 88   Ht 5\' 11"  (1.803 m)   Wt (!) 306 lb (138.8 kg)   SpO2 97%   BMI 42.68 kg/m   Wt Readings from Last 3 Encounters:  01/21/17 (!) 306 lb (138.8 kg)  12/10/16 (!) 306 lb (138.8 kg)  12/04/16 295 lb (133.8 kg)   Constitutional: overweight, in NAD Eyes: PERRLA, EOMI, no exophthalmos ENT: moist mucous membranes, no thyromegaly, no cervical lymphadenopathy Cardiovascular: RRR, No MRG Respiratory: CTA B Gastrointestinal: abdomen soft, NT, ND, BS+ Musculoskeletal: + deformities: L charcot ankle joint, strength intact in all 4 Skin: moist, warm, no rashes Neurological: no tremor with outstretched hands, DTR normal in all 4   ASSESSMENT: 1. DM2, insulin-dependent, uncontrolled, with complications - PN - CKD with proteinuria  - sees nephrology. 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  2. HL  3.  Obesity class III  PLAN:  1. Patient with long-standing, previously uncontrolled diabetes, on oral antidiabetic regimen, with last HbA1c at 9.2%.  At last visit, we did not change his antidiabetic regimen but we discussed at length about diet and he started to follow the suggestions.  He cut his portions down, switch to a more plant-based diet (cut out red meat he has chicken and [place] and not every day), no concentrated sweets.  His sugars have dramatically improved and he also feels better.  He now has lows throughout the day so we discussed about stopping the sulfonylurea.  I advised him to let me know if he has any more lows, in that case, we need to reduce the  metformin dose to half. - I suggested to:  Patient Instructions  Please continue: - Metformin 1000 mg 2x a day  Please stop: - Amaryl   Please let me know if the sugars still drop <70.  Please return in 3-4 months with your sugar log.   - today, HbA1c is 6% (much better) - continue checking sugars at different times of the day - check 1x a day, rotating checks - advised for yearly eye exams >> he is UTD - Return to clinic in 3 mo with sugar log   2. HL - Reviewed latest lipid panel from 10/2016.  LDL is above goal. - We will continue atorvastatin.  He has no side effects from it. - With his improvement in diet, I expect the next lipid panel to also improve  3.  Obesity class III - He did not lose weight since last visit, however, today we will  stop Amaryl and this will help him with weight loss.  I also strongly encouraged him to continue the current diet.  Carlus Pavlovristina Shaquan Puerta, MD PhD [facility]

## 2017-01-21 NOTE — Patient Instructions (Addendum)
Please continue: - Metformin 1000 mg 2x a day  Please stop: - Amaryl   Please let me know if the sugars still drop <70.  Please return in 3-4 months with your sugar log.

## 2017-01-22 DIAGNOSIS — N183 Chronic kidney disease, stage 3 (moderate): Secondary | ICD-10-CM | POA: Diagnosis not present

## 2017-01-22 DIAGNOSIS — D509 Iron deficiency anemia, unspecified: Secondary | ICD-10-CM | POA: Diagnosis not present

## 2017-01-22 DIAGNOSIS — I10 Essential (primary) hypertension: Secondary | ICD-10-CM | POA: Diagnosis not present

## 2017-01-22 DIAGNOSIS — R262 Difficulty in walking, not elsewhere classified: Secondary | ICD-10-CM | POA: Diagnosis not present

## 2017-01-22 DIAGNOSIS — Z79899 Other long term (current) drug therapy: Secondary | ICD-10-CM | POA: Diagnosis not present

## 2017-01-22 DIAGNOSIS — M6281 Muscle weakness (generalized): Secondary | ICD-10-CM | POA: Diagnosis not present

## 2017-01-22 DIAGNOSIS — E559 Vitamin D deficiency, unspecified: Secondary | ICD-10-CM | POA: Diagnosis not present

## 2017-01-25 DIAGNOSIS — R262 Difficulty in walking, not elsewhere classified: Secondary | ICD-10-CM | POA: Diagnosis not present

## 2017-01-25 DIAGNOSIS — M6281 Muscle weakness (generalized): Secondary | ICD-10-CM | POA: Diagnosis not present

## 2017-01-26 DIAGNOSIS — I10 Essential (primary) hypertension: Secondary | ICD-10-CM | POA: Diagnosis not present

## 2017-01-26 DIAGNOSIS — R809 Proteinuria, unspecified: Secondary | ICD-10-CM | POA: Diagnosis not present

## 2017-01-26 DIAGNOSIS — E1129 Type 2 diabetes mellitus with other diabetic kidney complication: Secondary | ICD-10-CM | POA: Diagnosis not present

## 2017-01-26 DIAGNOSIS — E875 Hyperkalemia: Secondary | ICD-10-CM | POA: Diagnosis not present

## 2017-01-27 DIAGNOSIS — R262 Difficulty in walking, not elsewhere classified: Secondary | ICD-10-CM | POA: Diagnosis not present

## 2017-01-27 DIAGNOSIS — M6281 Muscle weakness (generalized): Secondary | ICD-10-CM | POA: Diagnosis not present

## 2017-02-02 DIAGNOSIS — L600 Ingrowing nail: Secondary | ICD-10-CM | POA: Diagnosis not present

## 2017-02-02 DIAGNOSIS — N183 Chronic kidney disease, stage 3 (moderate): Secondary | ICD-10-CM | POA: Diagnosis not present

## 2017-02-02 DIAGNOSIS — I10 Essential (primary) hypertension: Secondary | ICD-10-CM | POA: Diagnosis not present

## 2017-02-02 DIAGNOSIS — E1140 Type 2 diabetes mellitus with diabetic neuropathy, unspecified: Secondary | ICD-10-CM | POA: Diagnosis not present

## 2017-02-03 DIAGNOSIS — R262 Difficulty in walking, not elsewhere classified: Secondary | ICD-10-CM | POA: Diagnosis not present

## 2017-02-03 DIAGNOSIS — M6281 Muscle weakness (generalized): Secondary | ICD-10-CM | POA: Diagnosis not present

## 2017-02-05 DIAGNOSIS — M6281 Muscle weakness (generalized): Secondary | ICD-10-CM | POA: Diagnosis not present

## 2017-02-05 DIAGNOSIS — R262 Difficulty in walking, not elsewhere classified: Secondary | ICD-10-CM | POA: Diagnosis not present

## 2017-02-08 DIAGNOSIS — G63 Polyneuropathy in diseases classified elsewhere: Secondary | ICD-10-CM | POA: Diagnosis not present

## 2017-02-08 DIAGNOSIS — M14672 Charcot's joint, left ankle and foot: Secondary | ICD-10-CM | POA: Diagnosis not present

## 2017-02-08 DIAGNOSIS — E11610 Type 2 diabetes mellitus with diabetic neuropathic arthropathy: Secondary | ICD-10-CM | POA: Diagnosis not present

## 2017-02-08 DIAGNOSIS — L600 Ingrowing nail: Secondary | ICD-10-CM | POA: Diagnosis not present

## 2017-02-08 DIAGNOSIS — I10 Essential (primary) hypertension: Secondary | ICD-10-CM | POA: Diagnosis not present

## 2017-02-18 DIAGNOSIS — R262 Difficulty in walking, not elsewhere classified: Secondary | ICD-10-CM | POA: Diagnosis not present

## 2017-02-18 DIAGNOSIS — G600 Hereditary motor and sensory neuropathy: Secondary | ICD-10-CM | POA: Diagnosis not present

## 2017-02-18 DIAGNOSIS — M6281 Muscle weakness (generalized): Secondary | ICD-10-CM | POA: Diagnosis not present

## 2017-02-18 DIAGNOSIS — R609 Edema, unspecified: Secondary | ICD-10-CM | POA: Diagnosis not present

## 2017-02-23 DIAGNOSIS — Z79899 Other long term (current) drug therapy: Secondary | ICD-10-CM | POA: Diagnosis not present

## 2017-02-23 DIAGNOSIS — M14672 Charcot's joint, left ankle and foot: Secondary | ICD-10-CM | POA: Diagnosis not present

## 2017-02-23 DIAGNOSIS — Z9889 Other specified postprocedural states: Secondary | ICD-10-CM | POA: Diagnosis not present

## 2017-02-23 DIAGNOSIS — Z5181 Encounter for therapeutic drug level monitoring: Secondary | ICD-10-CM | POA: Diagnosis not present

## 2017-02-23 DIAGNOSIS — M79672 Pain in left foot: Secondary | ICD-10-CM | POA: Diagnosis not present

## 2017-02-23 DIAGNOSIS — G8921 Chronic pain due to trauma: Secondary | ICD-10-CM | POA: Diagnosis not present

## 2017-02-24 DIAGNOSIS — M6281 Muscle weakness (generalized): Secondary | ICD-10-CM | POA: Diagnosis not present

## 2017-02-24 DIAGNOSIS — R262 Difficulty in walking, not elsewhere classified: Secondary | ICD-10-CM | POA: Diagnosis not present

## 2017-02-26 DIAGNOSIS — R262 Difficulty in walking, not elsewhere classified: Secondary | ICD-10-CM | POA: Diagnosis not present

## 2017-02-26 DIAGNOSIS — M6281 Muscle weakness (generalized): Secondary | ICD-10-CM | POA: Diagnosis not present

## 2017-03-03 DIAGNOSIS — M6281 Muscle weakness (generalized): Secondary | ICD-10-CM | POA: Diagnosis not present

## 2017-03-03 DIAGNOSIS — R262 Difficulty in walking, not elsewhere classified: Secondary | ICD-10-CM | POA: Diagnosis not present

## 2017-03-05 DIAGNOSIS — R262 Difficulty in walking, not elsewhere classified: Secondary | ICD-10-CM | POA: Diagnosis not present

## 2017-03-05 DIAGNOSIS — M6281 Muscle weakness (generalized): Secondary | ICD-10-CM | POA: Diagnosis not present

## 2017-03-10 DIAGNOSIS — T84293A Other mechanical complication of internal fixation device of bones of foot and toes, initial encounter: Secondary | ICD-10-CM | POA: Diagnosis not present

## 2017-03-10 DIAGNOSIS — M14672 Charcot's joint, left ankle and foot: Secondary | ICD-10-CM | POA: Diagnosis not present

## 2017-03-10 DIAGNOSIS — Z967 Presence of other bone and tendon implants: Secondary | ICD-10-CM | POA: Diagnosis not present

## 2017-03-10 DIAGNOSIS — M1460 Charcot's joint, unspecified site: Secondary | ICD-10-CM | POA: Diagnosis not present

## 2017-03-10 DIAGNOSIS — G63 Polyneuropathy in diseases classified elsewhere: Secondary | ICD-10-CM | POA: Diagnosis not present

## 2017-03-10 DIAGNOSIS — T849XXA Unspecified complication of internal orthopedic prosthetic device, implant and graft, initial encounter: Secondary | ICD-10-CM | POA: Diagnosis not present

## 2017-03-10 DIAGNOSIS — E11610 Type 2 diabetes mellitus with diabetic neuropathic arthropathy: Secondary | ICD-10-CM | POA: Diagnosis not present

## 2017-03-23 DIAGNOSIS — E1142 Type 2 diabetes mellitus with diabetic polyneuropathy: Secondary | ICD-10-CM | POA: Diagnosis not present

## 2017-03-23 DIAGNOSIS — M14672 Charcot's joint, left ankle and foot: Secondary | ICD-10-CM | POA: Diagnosis not present

## 2017-03-23 DIAGNOSIS — T849XXA Unspecified complication of internal orthopedic prosthetic device, implant and graft, initial encounter: Secondary | ICD-10-CM | POA: Diagnosis not present

## 2017-03-23 DIAGNOSIS — G894 Chronic pain syndrome: Secondary | ICD-10-CM | POA: Diagnosis not present

## 2017-03-23 DIAGNOSIS — I10 Essential (primary) hypertension: Secondary | ICD-10-CM | POA: Diagnosis not present

## 2017-03-23 DIAGNOSIS — M79672 Pain in left foot: Secondary | ICD-10-CM | POA: Diagnosis not present

## 2017-03-23 DIAGNOSIS — Z9889 Other specified postprocedural states: Secondary | ICD-10-CM | POA: Diagnosis not present

## 2017-04-12 DIAGNOSIS — M14672 Charcot's joint, left ankle and foot: Secondary | ICD-10-CM | POA: Diagnosis not present

## 2017-04-12 DIAGNOSIS — I10 Essential (primary) hypertension: Secondary | ICD-10-CM | POA: Diagnosis not present

## 2017-04-12 DIAGNOSIS — T849XXA Unspecified complication of internal orthopedic prosthetic device, implant and graft, initial encounter: Secondary | ICD-10-CM | POA: Diagnosis not present

## 2017-04-12 DIAGNOSIS — M19079 Primary osteoarthritis, unspecified ankle and foot: Secondary | ICD-10-CM | POA: Diagnosis not present

## 2017-04-22 DIAGNOSIS — R809 Proteinuria, unspecified: Secondary | ICD-10-CM | POA: Diagnosis not present

## 2017-04-22 DIAGNOSIS — Z79899 Other long term (current) drug therapy: Secondary | ICD-10-CM | POA: Diagnosis not present

## 2017-04-22 DIAGNOSIS — N183 Chronic kidney disease, stage 3 (moderate): Secondary | ICD-10-CM | POA: Diagnosis not present

## 2017-04-22 DIAGNOSIS — E559 Vitamin D deficiency, unspecified: Secondary | ICD-10-CM | POA: Diagnosis not present

## 2017-04-22 DIAGNOSIS — I10 Essential (primary) hypertension: Secondary | ICD-10-CM | POA: Diagnosis not present

## 2017-04-22 DIAGNOSIS — D509 Iron deficiency anemia, unspecified: Secondary | ICD-10-CM | POA: Diagnosis not present

## 2017-04-28 DIAGNOSIS — M14672 Charcot's joint, left ankle and foot: Secondary | ICD-10-CM | POA: Diagnosis not present

## 2017-04-28 DIAGNOSIS — I10 Essential (primary) hypertension: Secondary | ICD-10-CM | POA: Diagnosis not present

## 2017-04-28 DIAGNOSIS — E875 Hyperkalemia: Secondary | ICD-10-CM | POA: Diagnosis not present

## 2017-04-28 DIAGNOSIS — R801 Persistent proteinuria, unspecified: Secondary | ICD-10-CM | POA: Diagnosis not present

## 2017-04-28 DIAGNOSIS — T849XXA Unspecified complication of internal orthopedic prosthetic device, implant and graft, initial encounter: Secondary | ICD-10-CM | POA: Diagnosis not present

## 2017-04-28 DIAGNOSIS — E559 Vitamin D deficiency, unspecified: Secondary | ICD-10-CM | POA: Diagnosis not present

## 2017-04-28 DIAGNOSIS — N183 Chronic kidney disease, stage 3 (moderate): Secondary | ICD-10-CM | POA: Diagnosis not present

## 2017-05-08 DIAGNOSIS — T849XXA Unspecified complication of internal orthopedic prosthetic device, implant and graft, initial encounter: Secondary | ICD-10-CM | POA: Diagnosis not present

## 2017-05-08 DIAGNOSIS — M14672 Charcot's joint, left ankle and foot: Secondary | ICD-10-CM | POA: Diagnosis not present

## 2017-05-08 DIAGNOSIS — M19079 Primary osteoarthritis, unspecified ankle and foot: Secondary | ICD-10-CM | POA: Diagnosis not present

## 2017-05-10 ENCOUNTER — Encounter: Payer: Self-pay | Admitting: Internal Medicine

## 2017-05-14 DIAGNOSIS — N183 Chronic kidney disease, stage 3 (moderate): Secondary | ICD-10-CM | POA: Diagnosis not present

## 2017-05-14 DIAGNOSIS — Z79899 Other long term (current) drug therapy: Secondary | ICD-10-CM | POA: Diagnosis not present

## 2017-05-14 DIAGNOSIS — I10 Essential (primary) hypertension: Secondary | ICD-10-CM | POA: Diagnosis not present

## 2017-05-21 ENCOUNTER — Ambulatory Visit: Payer: BLUE CROSS/BLUE SHIELD | Admitting: Internal Medicine

## 2017-05-21 DIAGNOSIS — Z6841 Body Mass Index (BMI) 40.0 and over, adult: Secondary | ICD-10-CM | POA: Diagnosis not present

## 2017-05-21 DIAGNOSIS — F339 Major depressive disorder, recurrent, unspecified: Secondary | ICD-10-CM | POA: Diagnosis not present

## 2017-05-21 DIAGNOSIS — D631 Anemia in chronic kidney disease: Secondary | ICD-10-CM | POA: Diagnosis not present

## 2017-05-21 DIAGNOSIS — E11610 Type 2 diabetes mellitus with diabetic neuropathic arthropathy: Secondary | ICD-10-CM | POA: Diagnosis not present

## 2017-05-21 DIAGNOSIS — K5900 Constipation, unspecified: Secondary | ICD-10-CM | POA: Diagnosis not present

## 2017-05-21 DIAGNOSIS — A5216 Charcot's arthropathy (tabetic): Secondary | ICD-10-CM | POA: Diagnosis not present

## 2017-05-21 DIAGNOSIS — I10 Essential (primary) hypertension: Secondary | ICD-10-CM | POA: Diagnosis not present

## 2017-05-21 DIAGNOSIS — E785 Hyperlipidemia, unspecified: Secondary | ICD-10-CM | POA: Diagnosis not present

## 2017-05-21 DIAGNOSIS — Z87891 Personal history of nicotine dependence: Secondary | ICD-10-CM | POA: Diagnosis not present

## 2017-05-21 DIAGNOSIS — Z7984 Long term (current) use of oral hypoglycemic drugs: Secondary | ICD-10-CM | POA: Diagnosis not present

## 2017-05-21 DIAGNOSIS — T849XXA Unspecified complication of internal orthopedic prosthetic device, implant and graft, initial encounter: Secondary | ICD-10-CM | POA: Diagnosis not present

## 2017-05-21 DIAGNOSIS — R278 Other lack of coordination: Secondary | ICD-10-CM | POA: Diagnosis not present

## 2017-05-21 DIAGNOSIS — M24672 Ankylosis, left ankle: Secondary | ICD-10-CM | POA: Diagnosis not present

## 2017-05-21 DIAGNOSIS — Z981 Arthrodesis status: Secondary | ICD-10-CM | POA: Diagnosis not present

## 2017-05-21 DIAGNOSIS — G8911 Acute pain due to trauma: Secondary | ICD-10-CM | POA: Diagnosis not present

## 2017-05-21 DIAGNOSIS — E1122 Type 2 diabetes mellitus with diabetic chronic kidney disease: Secondary | ICD-10-CM | POA: Diagnosis not present

## 2017-05-21 DIAGNOSIS — G8918 Other acute postprocedural pain: Secondary | ICD-10-CM | POA: Diagnosis not present

## 2017-05-21 DIAGNOSIS — I129 Hypertensive chronic kidney disease with stage 1 through stage 4 chronic kidney disease, or unspecified chronic kidney disease: Secondary | ICD-10-CM | POA: Diagnosis not present

## 2017-05-21 DIAGNOSIS — M6281 Muscle weakness (generalized): Secondary | ICD-10-CM | POA: Diagnosis not present

## 2017-05-21 DIAGNOSIS — E559 Vitamin D deficiency, unspecified: Secondary | ICD-10-CM | POA: Diagnosis not present

## 2017-05-21 DIAGNOSIS — T84117A Breakdown (mechanical) of internal fixation device of bone of left lower leg, initial encounter: Secondary | ICD-10-CM | POA: Diagnosis not present

## 2017-05-21 DIAGNOSIS — G894 Chronic pain syndrome: Secondary | ICD-10-CM | POA: Diagnosis not present

## 2017-05-21 DIAGNOSIS — E1142 Type 2 diabetes mellitus with diabetic polyneuropathy: Secondary | ICD-10-CM | POA: Diagnosis not present

## 2017-05-21 DIAGNOSIS — Z794 Long term (current) use of insulin: Secondary | ICD-10-CM | POA: Diagnosis not present

## 2017-05-21 DIAGNOSIS — D649 Anemia, unspecified: Secondary | ICD-10-CM | POA: Diagnosis not present

## 2017-05-21 DIAGNOSIS — N183 Chronic kidney disease, stage 3 (moderate): Secondary | ICD-10-CM | POA: Diagnosis not present

## 2017-05-21 DIAGNOSIS — K219 Gastro-esophageal reflux disease without esophagitis: Secondary | ICD-10-CM | POA: Diagnosis not present

## 2017-05-21 DIAGNOSIS — R269 Unspecified abnormalities of gait and mobility: Secondary | ICD-10-CM | POA: Diagnosis not present

## 2017-05-21 DIAGNOSIS — R2681 Unsteadiness on feet: Secondary | ICD-10-CM | POA: Diagnosis not present

## 2017-05-21 DIAGNOSIS — E1165 Type 2 diabetes mellitus with hyperglycemia: Secondary | ICD-10-CM | POA: Diagnosis not present

## 2017-05-21 DIAGNOSIS — M14672 Charcot's joint, left ankle and foot: Secondary | ICD-10-CM | POA: Diagnosis not present

## 2017-05-22 DIAGNOSIS — M14672 Charcot's joint, left ankle and foot: Secondary | ICD-10-CM | POA: Diagnosis not present

## 2017-05-22 DIAGNOSIS — E1122 Type 2 diabetes mellitus with diabetic chronic kidney disease: Secondary | ICD-10-CM | POA: Diagnosis not present

## 2017-05-22 DIAGNOSIS — I10 Essential (primary) hypertension: Secondary | ICD-10-CM | POA: Diagnosis not present

## 2017-05-22 DIAGNOSIS — G8918 Other acute postprocedural pain: Secondary | ICD-10-CM | POA: Diagnosis not present

## 2017-05-22 DIAGNOSIS — N183 Chronic kidney disease, stage 3 (moderate): Secondary | ICD-10-CM | POA: Diagnosis not present

## 2017-05-23 DIAGNOSIS — I10 Essential (primary) hypertension: Secondary | ICD-10-CM | POA: Diagnosis not present

## 2017-05-23 DIAGNOSIS — M14672 Charcot's joint, left ankle and foot: Secondary | ICD-10-CM | POA: Diagnosis not present

## 2017-05-23 DIAGNOSIS — E1122 Type 2 diabetes mellitus with diabetic chronic kidney disease: Secondary | ICD-10-CM | POA: Diagnosis not present

## 2017-05-23 DIAGNOSIS — G8918 Other acute postprocedural pain: Secondary | ICD-10-CM | POA: Diagnosis not present

## 2017-05-23 DIAGNOSIS — N183 Chronic kidney disease, stage 3 (moderate): Secondary | ICD-10-CM | POA: Diagnosis not present

## 2017-05-24 DIAGNOSIS — M14672 Charcot's joint, left ankle and foot: Secondary | ICD-10-CM | POA: Diagnosis not present

## 2017-05-24 DIAGNOSIS — N183 Chronic kidney disease, stage 3 (moderate): Secondary | ICD-10-CM | POA: Diagnosis not present

## 2017-05-24 DIAGNOSIS — I10 Essential (primary) hypertension: Secondary | ICD-10-CM | POA: Diagnosis not present

## 2017-05-24 DIAGNOSIS — G8918 Other acute postprocedural pain: Secondary | ICD-10-CM | POA: Diagnosis not present

## 2017-05-24 DIAGNOSIS — E1122 Type 2 diabetes mellitus with diabetic chronic kidney disease: Secondary | ICD-10-CM | POA: Diagnosis not present

## 2017-05-25 DIAGNOSIS — G8911 Acute pain due to trauma: Secondary | ICD-10-CM | POA: Diagnosis not present

## 2017-05-25 DIAGNOSIS — E1142 Type 2 diabetes mellitus with diabetic polyneuropathy: Secondary | ICD-10-CM | POA: Diagnosis not present

## 2017-05-25 DIAGNOSIS — E559 Vitamin D deficiency, unspecified: Secondary | ICD-10-CM | POA: Diagnosis not present

## 2017-05-25 DIAGNOSIS — N183 Chronic kidney disease, stage 3 (moderate): Secondary | ICD-10-CM | POA: Diagnosis not present

## 2017-05-25 DIAGNOSIS — F339 Major depressive disorder, recurrent, unspecified: Secondary | ICD-10-CM | POA: Diagnosis not present

## 2017-05-25 DIAGNOSIS — Z794 Long term (current) use of insulin: Secondary | ICD-10-CM | POA: Diagnosis not present

## 2017-05-25 DIAGNOSIS — M6281 Muscle weakness (generalized): Secondary | ICD-10-CM | POA: Diagnosis not present

## 2017-05-25 DIAGNOSIS — E1122 Type 2 diabetes mellitus with diabetic chronic kidney disease: Secondary | ICD-10-CM | POA: Diagnosis not present

## 2017-05-25 DIAGNOSIS — R269 Unspecified abnormalities of gait and mobility: Secondary | ICD-10-CM | POA: Diagnosis not present

## 2017-05-25 DIAGNOSIS — K5900 Constipation, unspecified: Secondary | ICD-10-CM | POA: Diagnosis not present

## 2017-05-25 DIAGNOSIS — R2681 Unsteadiness on feet: Secondary | ICD-10-CM | POA: Diagnosis not present

## 2017-05-25 DIAGNOSIS — M14672 Charcot's joint, left ankle and foot: Secondary | ICD-10-CM | POA: Diagnosis not present

## 2017-05-25 DIAGNOSIS — E785 Hyperlipidemia, unspecified: Secondary | ICD-10-CM | POA: Diagnosis not present

## 2017-05-25 DIAGNOSIS — R278 Other lack of coordination: Secondary | ICD-10-CM | POA: Diagnosis not present

## 2017-05-25 DIAGNOSIS — D649 Anemia, unspecified: Secondary | ICD-10-CM | POA: Diagnosis not present

## 2017-05-25 DIAGNOSIS — I10 Essential (primary) hypertension: Secondary | ICD-10-CM | POA: Diagnosis not present

## 2017-05-26 DIAGNOSIS — T8189XA Other complications of procedures, not elsewhere classified, initial encounter: Secondary | ICD-10-CM | POA: Diagnosis not present

## 2017-05-28 DIAGNOSIS — M6281 Muscle weakness (generalized): Secondary | ICD-10-CM | POA: Diagnosis not present

## 2017-05-28 DIAGNOSIS — R609 Edema, unspecified: Secondary | ICD-10-CM | POA: Diagnosis not present

## 2017-05-28 DIAGNOSIS — Z472 Encounter for removal of internal fixation device: Secondary | ICD-10-CM | POA: Diagnosis not present

## 2017-05-28 DIAGNOSIS — E119 Type 2 diabetes mellitus without complications: Secondary | ICD-10-CM | POA: Diagnosis not present

## 2017-06-01 DIAGNOSIS — N189 Chronic kidney disease, unspecified: Secondary | ICD-10-CM | POA: Diagnosis not present

## 2017-06-01 DIAGNOSIS — E119 Type 2 diabetes mellitus without complications: Secondary | ICD-10-CM | POA: Diagnosis not present

## 2017-06-01 DIAGNOSIS — R609 Edema, unspecified: Secondary | ICD-10-CM | POA: Diagnosis not present

## 2017-06-01 DIAGNOSIS — M6281 Muscle weakness (generalized): Secondary | ICD-10-CM | POA: Diagnosis not present

## 2017-06-01 DIAGNOSIS — Z472 Encounter for removal of internal fixation device: Secondary | ICD-10-CM | POA: Diagnosis not present

## 2017-06-02 DIAGNOSIS — T8189XD Other complications of procedures, not elsewhere classified, subsequent encounter: Secondary | ICD-10-CM | POA: Diagnosis not present

## 2017-06-03 DIAGNOSIS — I10 Essential (primary) hypertension: Secondary | ICD-10-CM | POA: Diagnosis not present

## 2017-06-03 DIAGNOSIS — E1140 Type 2 diabetes mellitus with diabetic neuropathy, unspecified: Secondary | ICD-10-CM | POA: Diagnosis not present

## 2017-06-03 DIAGNOSIS — E782 Mixed hyperlipidemia: Secondary | ICD-10-CM | POA: Diagnosis not present

## 2017-06-03 DIAGNOSIS — N183 Chronic kidney disease, stage 3 (moderate): Secondary | ICD-10-CM | POA: Diagnosis not present

## 2017-06-04 DIAGNOSIS — Z472 Encounter for removal of internal fixation device: Secondary | ICD-10-CM | POA: Diagnosis not present

## 2017-06-04 DIAGNOSIS — E119 Type 2 diabetes mellitus without complications: Secondary | ICD-10-CM | POA: Diagnosis not present

## 2017-06-04 DIAGNOSIS — J302 Other seasonal allergic rhinitis: Secondary | ICD-10-CM | POA: Diagnosis not present

## 2017-06-04 DIAGNOSIS — M6281 Muscle weakness (generalized): Secondary | ICD-10-CM | POA: Diagnosis not present

## 2017-06-05 DIAGNOSIS — J302 Other seasonal allergic rhinitis: Secondary | ICD-10-CM | POA: Diagnosis not present

## 2017-06-05 DIAGNOSIS — Z472 Encounter for removal of internal fixation device: Secondary | ICD-10-CM | POA: Diagnosis not present

## 2017-06-05 DIAGNOSIS — M6281 Muscle weakness (generalized): Secondary | ICD-10-CM | POA: Diagnosis not present

## 2017-06-05 DIAGNOSIS — E119 Type 2 diabetes mellitus without complications: Secondary | ICD-10-CM | POA: Diagnosis not present

## 2017-06-07 DIAGNOSIS — K5900 Constipation, unspecified: Secondary | ICD-10-CM | POA: Diagnosis not present

## 2017-06-07 DIAGNOSIS — G8911 Acute pain due to trauma: Secondary | ICD-10-CM | POA: Diagnosis not present

## 2017-06-07 DIAGNOSIS — I10 Essential (primary) hypertension: Secondary | ICD-10-CM | POA: Diagnosis not present

## 2017-06-07 DIAGNOSIS — M14672 Charcot's joint, left ankle and foot: Secondary | ICD-10-CM | POA: Diagnosis not present

## 2017-06-07 DIAGNOSIS — R278 Other lack of coordination: Secondary | ICD-10-CM | POA: Diagnosis not present

## 2017-06-07 DIAGNOSIS — E1142 Type 2 diabetes mellitus with diabetic polyneuropathy: Secondary | ICD-10-CM | POA: Diagnosis not present

## 2017-06-07 DIAGNOSIS — D649 Anemia, unspecified: Secondary | ICD-10-CM | POA: Diagnosis not present

## 2017-06-07 DIAGNOSIS — E559 Vitamin D deficiency, unspecified: Secondary | ICD-10-CM | POA: Diagnosis not present

## 2017-06-07 DIAGNOSIS — Z4789 Encounter for other orthopedic aftercare: Secondary | ICD-10-CM | POA: Diagnosis not present

## 2017-06-07 DIAGNOSIS — E785 Hyperlipidemia, unspecified: Secondary | ICD-10-CM | POA: Diagnosis not present

## 2017-06-07 DIAGNOSIS — R269 Unspecified abnormalities of gait and mobility: Secondary | ICD-10-CM | POA: Diagnosis not present

## 2017-06-07 DIAGNOSIS — E1122 Type 2 diabetes mellitus with diabetic chronic kidney disease: Secondary | ICD-10-CM | POA: Diagnosis not present

## 2017-06-07 DIAGNOSIS — F339 Major depressive disorder, recurrent, unspecified: Secondary | ICD-10-CM | POA: Diagnosis not present

## 2017-06-07 DIAGNOSIS — M6281 Muscle weakness (generalized): Secondary | ICD-10-CM | POA: Diagnosis not present

## 2017-06-07 DIAGNOSIS — R2681 Unsteadiness on feet: Secondary | ICD-10-CM | POA: Diagnosis not present

## 2017-06-07 DIAGNOSIS — Z794 Long term (current) use of insulin: Secondary | ICD-10-CM | POA: Diagnosis not present

## 2017-06-07 DIAGNOSIS — N183 Chronic kidney disease, stage 3 (moderate): Secondary | ICD-10-CM | POA: Diagnosis not present

## 2017-06-08 DIAGNOSIS — N189 Chronic kidney disease, unspecified: Secondary | ICD-10-CM | POA: Diagnosis not present

## 2017-06-08 DIAGNOSIS — G4701 Insomnia due to medical condition: Secondary | ICD-10-CM | POA: Diagnosis not present

## 2017-06-08 DIAGNOSIS — E119 Type 2 diabetes mellitus without complications: Secondary | ICD-10-CM | POA: Diagnosis not present

## 2017-06-08 DIAGNOSIS — T849XXA Unspecified complication of internal orthopedic prosthetic device, implant and graft, initial encounter: Secondary | ICD-10-CM | POA: Diagnosis not present

## 2017-06-09 DIAGNOSIS — T8189XD Other complications of procedures, not elsewhere classified, subsequent encounter: Secondary | ICD-10-CM | POA: Diagnosis not present

## 2017-06-11 DIAGNOSIS — M6281 Muscle weakness (generalized): Secondary | ICD-10-CM | POA: Diagnosis not present

## 2017-06-11 DIAGNOSIS — E119 Type 2 diabetes mellitus without complications: Secondary | ICD-10-CM | POA: Diagnosis not present

## 2017-06-11 DIAGNOSIS — Z472 Encounter for removal of internal fixation device: Secondary | ICD-10-CM | POA: Diagnosis not present

## 2017-06-11 DIAGNOSIS — J302 Other seasonal allergic rhinitis: Secondary | ICD-10-CM | POA: Diagnosis not present

## 2017-06-14 DIAGNOSIS — R05 Cough: Secondary | ICD-10-CM | POA: Diagnosis not present

## 2017-06-18 DIAGNOSIS — I10 Essential (primary) hypertension: Secondary | ICD-10-CM | POA: Diagnosis not present

## 2017-06-18 DIAGNOSIS — M14672 Charcot's joint, left ankle and foot: Secondary | ICD-10-CM | POA: Diagnosis not present

## 2017-06-18 DIAGNOSIS — M79672 Pain in left foot: Secondary | ICD-10-CM | POA: Diagnosis not present

## 2017-06-18 DIAGNOSIS — E1142 Type 2 diabetes mellitus with diabetic polyneuropathy: Secondary | ICD-10-CM | POA: Diagnosis not present

## 2017-06-18 DIAGNOSIS — Z9889 Other specified postprocedural states: Secondary | ICD-10-CM | POA: Diagnosis not present

## 2017-06-18 DIAGNOSIS — G894 Chronic pain syndrome: Secondary | ICD-10-CM | POA: Diagnosis not present

## 2017-06-21 DIAGNOSIS — Z4789 Encounter for other orthopedic aftercare: Secondary | ICD-10-CM | POA: Diagnosis not present

## 2017-06-25 DIAGNOSIS — R609 Edema, unspecified: Secondary | ICD-10-CM | POA: Diagnosis not present

## 2017-06-25 DIAGNOSIS — M6281 Muscle weakness (generalized): Secondary | ICD-10-CM | POA: Diagnosis not present

## 2017-06-25 DIAGNOSIS — E119 Type 2 diabetes mellitus without complications: Secondary | ICD-10-CM | POA: Diagnosis not present

## 2017-06-25 DIAGNOSIS — Z472 Encounter for removal of internal fixation device: Secondary | ICD-10-CM | POA: Diagnosis not present

## 2017-06-28 DIAGNOSIS — N183 Chronic kidney disease, stage 3 (moderate): Secondary | ICD-10-CM | POA: Diagnosis not present

## 2017-06-28 DIAGNOSIS — E1122 Type 2 diabetes mellitus with diabetic chronic kidney disease: Secondary | ICD-10-CM | POA: Diagnosis not present

## 2017-06-28 DIAGNOSIS — E1142 Type 2 diabetes mellitus with diabetic polyneuropathy: Secondary | ICD-10-CM | POA: Diagnosis not present

## 2017-06-28 DIAGNOSIS — I10 Essential (primary) hypertension: Secondary | ICD-10-CM | POA: Diagnosis not present

## 2017-06-28 DIAGNOSIS — K5900 Constipation, unspecified: Secondary | ICD-10-CM | POA: Diagnosis not present

## 2017-06-28 DIAGNOSIS — M1990 Unspecified osteoarthritis, unspecified site: Secondary | ICD-10-CM | POA: Diagnosis not present

## 2017-06-28 DIAGNOSIS — G894 Chronic pain syndrome: Secondary | ICD-10-CM | POA: Diagnosis not present

## 2017-06-28 DIAGNOSIS — E785 Hyperlipidemia, unspecified: Secondary | ICD-10-CM | POA: Diagnosis not present

## 2017-06-28 DIAGNOSIS — F339 Major depressive disorder, recurrent, unspecified: Secondary | ICD-10-CM | POA: Diagnosis not present

## 2017-06-28 DIAGNOSIS — K219 Gastro-esophageal reflux disease without esophagitis: Secondary | ICD-10-CM | POA: Diagnosis not present

## 2017-06-28 DIAGNOSIS — D631 Anemia in chronic kidney disease: Secondary | ICD-10-CM | POA: Diagnosis not present

## 2017-06-28 DIAGNOSIS — E559 Vitamin D deficiency, unspecified: Secondary | ICD-10-CM | POA: Diagnosis not present

## 2017-06-28 DIAGNOSIS — E11610 Type 2 diabetes mellitus with diabetic neuropathic arthropathy: Secondary | ICD-10-CM | POA: Diagnosis not present

## 2017-07-01 DIAGNOSIS — E1140 Type 2 diabetes mellitus with diabetic neuropathy, unspecified: Secondary | ICD-10-CM | POA: Diagnosis not present

## 2017-07-01 DIAGNOSIS — D509 Iron deficiency anemia, unspecified: Secondary | ICD-10-CM | POA: Diagnosis not present

## 2017-07-01 DIAGNOSIS — E559 Vitamin D deficiency, unspecified: Secondary | ICD-10-CM | POA: Diagnosis not present

## 2017-07-01 DIAGNOSIS — Z79899 Other long term (current) drug therapy: Secondary | ICD-10-CM | POA: Diagnosis not present

## 2017-07-01 DIAGNOSIS — N183 Chronic kidney disease, stage 3 (moderate): Secondary | ICD-10-CM | POA: Diagnosis not present

## 2017-07-01 DIAGNOSIS — I10 Essential (primary) hypertension: Secondary | ICD-10-CM | POA: Diagnosis not present

## 2017-07-02 DIAGNOSIS — E11610 Type 2 diabetes mellitus with diabetic neuropathic arthropathy: Secondary | ICD-10-CM | POA: Diagnosis not present

## 2017-07-02 DIAGNOSIS — D631 Anemia in chronic kidney disease: Secondary | ICD-10-CM | POA: Diagnosis not present

## 2017-07-02 DIAGNOSIS — E1142 Type 2 diabetes mellitus with diabetic polyneuropathy: Secondary | ICD-10-CM | POA: Diagnosis not present

## 2017-07-02 DIAGNOSIS — E1122 Type 2 diabetes mellitus with diabetic chronic kidney disease: Secondary | ICD-10-CM | POA: Diagnosis not present

## 2017-07-02 DIAGNOSIS — E785 Hyperlipidemia, unspecified: Secondary | ICD-10-CM | POA: Diagnosis not present

## 2017-07-02 DIAGNOSIS — I10 Essential (primary) hypertension: Secondary | ICD-10-CM | POA: Diagnosis not present

## 2017-07-02 DIAGNOSIS — M1990 Unspecified osteoarthritis, unspecified site: Secondary | ICD-10-CM | POA: Diagnosis not present

## 2017-07-02 DIAGNOSIS — K219 Gastro-esophageal reflux disease without esophagitis: Secondary | ICD-10-CM | POA: Diagnosis not present

## 2017-07-02 DIAGNOSIS — E559 Vitamin D deficiency, unspecified: Secondary | ICD-10-CM | POA: Diagnosis not present

## 2017-07-02 DIAGNOSIS — F339 Major depressive disorder, recurrent, unspecified: Secondary | ICD-10-CM | POA: Diagnosis not present

## 2017-07-02 DIAGNOSIS — K5900 Constipation, unspecified: Secondary | ICD-10-CM | POA: Diagnosis not present

## 2017-07-02 DIAGNOSIS — G894 Chronic pain syndrome: Secondary | ICD-10-CM | POA: Diagnosis not present

## 2017-07-02 DIAGNOSIS — N183 Chronic kidney disease, stage 3 (moderate): Secondary | ICD-10-CM | POA: Diagnosis not present

## 2017-07-05 DIAGNOSIS — M79605 Pain in left leg: Secondary | ICD-10-CM | POA: Diagnosis not present

## 2017-07-05 DIAGNOSIS — E875 Hyperkalemia: Secondary | ICD-10-CM | POA: Diagnosis not present

## 2017-07-05 DIAGNOSIS — M7989 Other specified soft tissue disorders: Secondary | ICD-10-CM | POA: Diagnosis not present

## 2017-07-06 DIAGNOSIS — E1142 Type 2 diabetes mellitus with diabetic polyneuropathy: Secondary | ICD-10-CM | POA: Diagnosis not present

## 2017-07-06 DIAGNOSIS — K5900 Constipation, unspecified: Secondary | ICD-10-CM | POA: Diagnosis not present

## 2017-07-06 DIAGNOSIS — F339 Major depressive disorder, recurrent, unspecified: Secondary | ICD-10-CM | POA: Diagnosis not present

## 2017-07-06 DIAGNOSIS — M1990 Unspecified osteoarthritis, unspecified site: Secondary | ICD-10-CM | POA: Diagnosis not present

## 2017-07-06 DIAGNOSIS — G894 Chronic pain syndrome: Secondary | ICD-10-CM | POA: Diagnosis not present

## 2017-07-06 DIAGNOSIS — E559 Vitamin D deficiency, unspecified: Secondary | ICD-10-CM | POA: Diagnosis not present

## 2017-07-06 DIAGNOSIS — I10 Essential (primary) hypertension: Secondary | ICD-10-CM | POA: Diagnosis not present

## 2017-07-06 DIAGNOSIS — N183 Chronic kidney disease, stage 3 (moderate): Secondary | ICD-10-CM | POA: Diagnosis not present

## 2017-07-06 DIAGNOSIS — E1122 Type 2 diabetes mellitus with diabetic chronic kidney disease: Secondary | ICD-10-CM | POA: Diagnosis not present

## 2017-07-06 DIAGNOSIS — K219 Gastro-esophageal reflux disease without esophagitis: Secondary | ICD-10-CM | POA: Diagnosis not present

## 2017-07-06 DIAGNOSIS — E785 Hyperlipidemia, unspecified: Secondary | ICD-10-CM | POA: Diagnosis not present

## 2017-07-06 DIAGNOSIS — E11610 Type 2 diabetes mellitus with diabetic neuropathic arthropathy: Secondary | ICD-10-CM | POA: Diagnosis not present

## 2017-07-06 DIAGNOSIS — D631 Anemia in chronic kidney disease: Secondary | ICD-10-CM | POA: Diagnosis not present

## 2017-07-07 ENCOUNTER — Ambulatory Visit (INDEPENDENT_AMBULATORY_CARE_PROVIDER_SITE_OTHER): Payer: BLUE CROSS/BLUE SHIELD | Admitting: Internal Medicine

## 2017-07-07 ENCOUNTER — Encounter: Payer: Self-pay | Admitting: Internal Medicine

## 2017-07-07 VITALS — BP 128/78 | HR 99

## 2017-07-07 DIAGNOSIS — N184 Chronic kidney disease, stage 4 (severe): Secondary | ICD-10-CM | POA: Diagnosis not present

## 2017-07-07 DIAGNOSIS — E889 Metabolic disorder, unspecified: Secondary | ICD-10-CM | POA: Diagnosis not present

## 2017-07-07 DIAGNOSIS — E785 Hyperlipidemia, unspecified: Secondary | ICD-10-CM | POA: Diagnosis not present

## 2017-07-07 DIAGNOSIS — E1122 Type 2 diabetes mellitus with diabetic chronic kidney disease: Secondary | ICD-10-CM

## 2017-07-07 DIAGNOSIS — M9080 Osteopathy in diseases classified elsewhere, unspecified site: Secondary | ICD-10-CM | POA: Diagnosis not present

## 2017-07-07 DIAGNOSIS — R809 Proteinuria, unspecified: Secondary | ICD-10-CM | POA: Diagnosis not present

## 2017-07-07 LAB — POCT GLYCOSYLATED HEMOGLOBIN (HGB A1C): Hemoglobin A1C: 8

## 2017-07-07 MED ORDER — GLIMEPIRIDE 2 MG PO TABS: 2.0000 mg | ORAL_TABLET | Freq: Every day | ORAL | 3 refills | Status: DC

## 2017-07-07 NOTE — Patient Instructions (Addendum)
Please continue: - Basaglar 25 units at bedtime  Please restart: - Amaryl 2 mg before b'fast  Please return in 3-4 months with your sugar log.

## 2017-07-07 NOTE — Progress Notes (Signed)
Patient ID: Reginald Carpenter, male   DOB: 05/04/1965, 52 y.o.   MRN: [id number]   HPI: Reginald Carpenter is a 52 y.o.-year-old male, initially referred by his PCP, Jarrett Soho, PA, returning for f/u for DM2, dx in ~2000, prev. insulin-dependent since 2017, uncontrolled, with complications (CKD, PN - + Charcot foot L ankle). Last visit 5.5 months ago.  Before last visit, he is switched to a more plant-based diet with some lean meats and smaller portions.  Sugars decreased significantly to the point of lows, so he stopped his Amaryl at that time.  He had Charcot foot sx in 05/2017 >> recently developed an infection he is now on antibiotics.  He has been off Metformin due to worsening of his kidney function and started on insulin  - Basaglar 25 units at bedtime since. Sugars started to fluctuate more and they are higher.  Last hemoglobin A1c was: Lab Results  Component Value Date   HGBA1C 8.0 07/07/2017   HGBA1C 6.0 01/21/2017  10/22/2016: HbA1c 9.2%  Pt is on a regimen of: - Basaglar 25 units at bedtime Stopped Metformin 1000 mg 2x a day, with meals At last visit, she was on Amaryl 2 mg before dinner - started 10/2016, stopped 01/2014 due to lows He was on Basaglar 26 units at bedtime, but stopped 10/2016 after adding Amaryl  Pt checks his sugars 1-2 times a day- improving after starting Basaglar - : - am: 130-140 >> 53-103 >> 120-130, 146 - 2h after b'fast: n/c due to worsening GFR - before lunch: n/c >> 62-103 >> 150s - 2h after lunch: n/c - before dinner: 98-190 >> 65-114, 133 >> 150-160  - 2h after dinner: n/c >> 126-143 >> n/c - bedtime: n/c >> 62-120 >> 150-160 - nighttime: n/c Lowest sugar was 98 >> 53 >> 123; hypoglycemia awareness a in the 60s. Highest sugar was 235 >> 153 >> 220  Glucometer: [employer]  Pt's meals are: - Breakfast: cereal; sausage and eggs - Lunch: ham and cheese sandwich, salads, pot pie - Dinner: salad, spaghetti, grilled chicken and steamed veggies - Snacks:  cottage cheese He stopped regular sodas - at the end of last year.  - + CKD sees nephrology, last BUN/creatinine: 07/01/2017: ?/2.88, GFR 24, Protein/creatinie ratio: 13,978 05/14/2017: ?/2.42, GFR 30 10/22/2016: 44/1.84, GFR 39, glucose 182  No results found for: BUN, CREATININE  On lisinopril 20.  - + HL; last set of lipids: 10/22/2016: 208/236/45/115 No results found for: CHOL, HDL, LDLCALC, LDLDIRECT, TRIG, CHOLHDL  On Lipitor 40.  - last eye exam was in 2017: ?DR  - + has numbness and tingling in his feet.  Pt has FH of DM in mother, sister (GDM).  He also has HTN.  ROS: Constitutional: no weight gain/no weight loss, no fatigue, no subjective hyperthermia, no subjective hypothermia Eyes: no blurry vision, no xerophthalmia ENT: no sore throat, no nodules palpated in throat, no dysphagia, no odynophagia, no hoarseness Cardiovascular: no CP/no SOB/no palpitations/no leg swelling Respiratory: no cough/no SOB/no wheezing Gastrointestinal: no N/no V/no D/no C/no acid reflux Musculoskeletal: no muscle aches/+ joint aches Skin: no rashes, no hair loss Neurological: no tremors/no numbness/no tingling/no dizziness  I reviewed pt's medications, allergies, PMH, social hx, family hx, and changes were documented in the history of present illness. Otherwise, unchanged from my initial visit note.  Past Medical History:  Diagnosis Date  . Chronic kidney disease   . Diabetes mellitus without complication (HCC)   . High cholesterol   . Hypertension  Past Surgical History:  Procedure Laterality Date  . ANKLE SURGERY     Social History   Social History  . Marital status: Married    Spouse name: N/A  . Number of children: 2   Occupational History  . Out of work   Social History Main Topics  . Smoking status: Former Smoker - quit 2004    Years: 10.00  . Smokeless tobacco: Never Used  . Alcohol use No  . Drug use: No   Current Outpatient Medications on File Prior to  Visit  Medication Sig Dispense Refill  . amLODipine (NORVASC) 5 MG tablet Take 5 mg by mouth daily.  3  . atorvastatin (LIPITOR) 80 MG tablet Take 80 mg by mouth every morning.    Marland Kitchen lisinopril-hydrochlorothiazide (PRINZIDE,ZESTORETIC) 20-12.5 MG tablet Take 2 tablets by mouth daily.    . metFORMIN (GLUCOPHAGE) 1000 MG tablet Take 1,000 mg by mouth 2 (two) times daily with a meal.  0  . pregabalin (LYRICA) 50 MG capsule Take 50 mg by mouth 2 (two) times daily.    . VELTASSA 8.4 g packet MIX AND DIS 1 PACKET IN WATER AND DRINK PO ONCE D.  6   No current facility-administered medications on file prior to visit.    No Known Allergies Family History  Problem Relation Age of Onset  . Heart failure Mother    Also; HTN, HL, heart ds, lung CA in Mother  PE: BP 128/78   Pulse 99   SpO2 99% He could not be weighed (wheelchair) Wt Readings from Last 3 Encounters:  01/21/17 (!) 306 lb (138.8 kg)  12/10/16 (!) 306 lb (138.8 kg)  12/04/16 295 lb (133.8 kg)   Constitutional: overweight, in NAD, in wheelchair Eyes: PERRLA, EOMI, no exophthalmos ENT: moist mucous membranes, no thyromegaly, no cervical lymphadenopathy Cardiovascular: RRR, No MRG Respiratory: CTA B Gastrointestinal: abdomen soft, NT, ND, BS+ Musculoskeletal: + deformities: Has L charcot ankle joint- now leg in a fixation device, strength intact in all 4 Skin: moist, warm, no rashes Neurological: no tremor with outstretched hands, DTR normal in all 4  ASSESSMENT: 1. DM2, insulin-dependent, uncontrolled, with complications - PN - CKD with proteinuria  - sees nephrology. 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  2. HL  3.  Obesity class III  PLAN:  1. Patient with long-standing, previously uncontrolled diabetes, on oral antidiabetic regimen, with last HbA1c greatly improved, at 6%.  Previously  9.2%.  At last visit, he was having lows throughout the day after he improved his diet towards a more plant-based one without concentrated sweets. We stopped his glimepiride.  He continues now only on metformin. - His sugars worsened around the time of his surgery and also after discharge to rehab.  He is now on Basaglar and his sugars started to improve.  He is also  adjusting his diet.  However, they are still above goal so we discussed about adding a low-dose Amaryl before breakfast.  I am hoping that his kidney function may improve so we can start at least half maximal dose of metformin at next visit.  If they do not improve, another option is to use a GLP-1 receptor agonist instead of Amaryl. - I suggested to:  Patient Instructions  Please continue: - Basaglar 25 units at bedtime  Please restart: - Amaryl 2 mg before b'fast  Please return in 3-4 months with your sugar log.   - today,  HbA1c is 8% (higher) - continue checking sugars at different times of the day - check 1-2x a day, rotating checks - advised for yearly eye exams >> he is not UTD - Return to clinic in 3-4 mo with sugar log    2. HL - Reviewed latest lipid panel from 10/2016: LDL was above goal - He continues atorvastatin without side effects. - With improvement in diabetes, I expect his cholesterol levels to also improve  3.  Obesity class III - He could not be weighed today -  he is cutting down portions since he is more immobile -he will be in wheelchair for 6 months  Carlus Pavlov, MD PhD [facility]

## 2017-07-08 DIAGNOSIS — T849XXA Unspecified complication of internal orthopedic prosthetic device, implant and graft, initial encounter: Secondary | ICD-10-CM | POA: Diagnosis not present

## 2017-07-09 DIAGNOSIS — E11610 Type 2 diabetes mellitus with diabetic neuropathic arthropathy: Secondary | ICD-10-CM | POA: Diagnosis not present

## 2017-07-09 DIAGNOSIS — K219 Gastro-esophageal reflux disease without esophagitis: Secondary | ICD-10-CM | POA: Diagnosis not present

## 2017-07-09 DIAGNOSIS — E559 Vitamin D deficiency, unspecified: Secondary | ICD-10-CM | POA: Diagnosis not present

## 2017-07-09 DIAGNOSIS — E785 Hyperlipidemia, unspecified: Secondary | ICD-10-CM | POA: Diagnosis not present

## 2017-07-09 DIAGNOSIS — I10 Essential (primary) hypertension: Secondary | ICD-10-CM | POA: Diagnosis not present

## 2017-07-09 DIAGNOSIS — K5900 Constipation, unspecified: Secondary | ICD-10-CM | POA: Diagnosis not present

## 2017-07-09 DIAGNOSIS — M1990 Unspecified osteoarthritis, unspecified site: Secondary | ICD-10-CM | POA: Diagnosis not present

## 2017-07-09 DIAGNOSIS — E1142 Type 2 diabetes mellitus with diabetic polyneuropathy: Secondary | ICD-10-CM | POA: Diagnosis not present

## 2017-07-09 DIAGNOSIS — E1122 Type 2 diabetes mellitus with diabetic chronic kidney disease: Secondary | ICD-10-CM | POA: Diagnosis not present

## 2017-07-09 DIAGNOSIS — N183 Chronic kidney disease, stage 3 (moderate): Secondary | ICD-10-CM | POA: Diagnosis not present

## 2017-07-09 DIAGNOSIS — F339 Major depressive disorder, recurrent, unspecified: Secondary | ICD-10-CM | POA: Diagnosis not present

## 2017-07-09 DIAGNOSIS — G894 Chronic pain syndrome: Secondary | ICD-10-CM | POA: Diagnosis not present

## 2017-07-09 DIAGNOSIS — D631 Anemia in chronic kidney disease: Secondary | ICD-10-CM | POA: Diagnosis not present

## 2017-07-12 ENCOUNTER — Other Ambulatory Visit: Payer: Self-pay | Admitting: Internal Medicine

## 2017-07-13 ENCOUNTER — Encounter: Payer: Self-pay | Admitting: Internal Medicine

## 2017-07-14 DIAGNOSIS — G894 Chronic pain syndrome: Secondary | ICD-10-CM | POA: Diagnosis not present

## 2017-07-14 DIAGNOSIS — E559 Vitamin D deficiency, unspecified: Secondary | ICD-10-CM | POA: Diagnosis not present

## 2017-07-14 DIAGNOSIS — E785 Hyperlipidemia, unspecified: Secondary | ICD-10-CM | POA: Diagnosis not present

## 2017-07-14 DIAGNOSIS — E1122 Type 2 diabetes mellitus with diabetic chronic kidney disease: Secondary | ICD-10-CM | POA: Diagnosis not present

## 2017-07-14 DIAGNOSIS — I10 Essential (primary) hypertension: Secondary | ICD-10-CM | POA: Diagnosis not present

## 2017-07-14 DIAGNOSIS — F339 Major depressive disorder, recurrent, unspecified: Secondary | ICD-10-CM | POA: Diagnosis not present

## 2017-07-14 DIAGNOSIS — D631 Anemia in chronic kidney disease: Secondary | ICD-10-CM | POA: Diagnosis not present

## 2017-07-14 DIAGNOSIS — N183 Chronic kidney disease, stage 3 (moderate): Secondary | ICD-10-CM | POA: Diagnosis not present

## 2017-07-14 DIAGNOSIS — K219 Gastro-esophageal reflux disease without esophagitis: Secondary | ICD-10-CM | POA: Diagnosis not present

## 2017-07-14 DIAGNOSIS — M1990 Unspecified osteoarthritis, unspecified site: Secondary | ICD-10-CM | POA: Diagnosis not present

## 2017-07-14 DIAGNOSIS — E1142 Type 2 diabetes mellitus with diabetic polyneuropathy: Secondary | ICD-10-CM | POA: Diagnosis not present

## 2017-07-14 DIAGNOSIS — E11610 Type 2 diabetes mellitus with diabetic neuropathic arthropathy: Secondary | ICD-10-CM | POA: Diagnosis not present

## 2017-07-14 DIAGNOSIS — K5900 Constipation, unspecified: Secondary | ICD-10-CM | POA: Diagnosis not present

## 2017-07-16 DIAGNOSIS — E559 Vitamin D deficiency, unspecified: Secondary | ICD-10-CM | POA: Diagnosis not present

## 2017-07-16 DIAGNOSIS — D631 Anemia in chronic kidney disease: Secondary | ICD-10-CM | POA: Diagnosis not present

## 2017-07-16 DIAGNOSIS — E785 Hyperlipidemia, unspecified: Secondary | ICD-10-CM | POA: Diagnosis not present

## 2017-07-16 DIAGNOSIS — E1142 Type 2 diabetes mellitus with diabetic polyneuropathy: Secondary | ICD-10-CM | POA: Diagnosis not present

## 2017-07-16 DIAGNOSIS — M1990 Unspecified osteoarthritis, unspecified site: Secondary | ICD-10-CM | POA: Diagnosis not present

## 2017-07-16 DIAGNOSIS — F339 Major depressive disorder, recurrent, unspecified: Secondary | ICD-10-CM | POA: Diagnosis not present

## 2017-07-16 DIAGNOSIS — N183 Chronic kidney disease, stage 3 (moderate): Secondary | ICD-10-CM | POA: Diagnosis not present

## 2017-07-16 DIAGNOSIS — E11610 Type 2 diabetes mellitus with diabetic neuropathic arthropathy: Secondary | ICD-10-CM | POA: Diagnosis not present

## 2017-07-16 DIAGNOSIS — K219 Gastro-esophageal reflux disease without esophagitis: Secondary | ICD-10-CM | POA: Diagnosis not present

## 2017-07-16 DIAGNOSIS — K5900 Constipation, unspecified: Secondary | ICD-10-CM | POA: Diagnosis not present

## 2017-07-16 DIAGNOSIS — G894 Chronic pain syndrome: Secondary | ICD-10-CM | POA: Diagnosis not present

## 2017-07-16 DIAGNOSIS — E1122 Type 2 diabetes mellitus with diabetic chronic kidney disease: Secondary | ICD-10-CM | POA: Diagnosis not present

## 2017-07-16 DIAGNOSIS — I10 Essential (primary) hypertension: Secondary | ICD-10-CM | POA: Diagnosis not present

## 2017-07-19 DIAGNOSIS — D631 Anemia in chronic kidney disease: Secondary | ICD-10-CM | POA: Diagnosis not present

## 2017-07-19 DIAGNOSIS — N183 Chronic kidney disease, stage 3 (moderate): Secondary | ICD-10-CM | POA: Diagnosis not present

## 2017-07-19 DIAGNOSIS — E785 Hyperlipidemia, unspecified: Secondary | ICD-10-CM | POA: Diagnosis not present

## 2017-07-19 DIAGNOSIS — E11610 Type 2 diabetes mellitus with diabetic neuropathic arthropathy: Secondary | ICD-10-CM | POA: Diagnosis not present

## 2017-07-19 DIAGNOSIS — K219 Gastro-esophageal reflux disease without esophagitis: Secondary | ICD-10-CM | POA: Diagnosis not present

## 2017-07-19 DIAGNOSIS — M1990 Unspecified osteoarthritis, unspecified site: Secondary | ICD-10-CM | POA: Diagnosis not present

## 2017-07-19 DIAGNOSIS — E559 Vitamin D deficiency, unspecified: Secondary | ICD-10-CM | POA: Diagnosis not present

## 2017-07-19 DIAGNOSIS — K5900 Constipation, unspecified: Secondary | ICD-10-CM | POA: Diagnosis not present

## 2017-07-19 DIAGNOSIS — I10 Essential (primary) hypertension: Secondary | ICD-10-CM | POA: Diagnosis not present

## 2017-07-19 DIAGNOSIS — E1122 Type 2 diabetes mellitus with diabetic chronic kidney disease: Secondary | ICD-10-CM | POA: Diagnosis not present

## 2017-07-19 DIAGNOSIS — F339 Major depressive disorder, recurrent, unspecified: Secondary | ICD-10-CM | POA: Diagnosis not present

## 2017-07-19 DIAGNOSIS — G894 Chronic pain syndrome: Secondary | ICD-10-CM | POA: Diagnosis not present

## 2017-07-19 DIAGNOSIS — E1142 Type 2 diabetes mellitus with diabetic polyneuropathy: Secondary | ICD-10-CM | POA: Diagnosis not present

## 2017-07-20 DIAGNOSIS — Z4789 Encounter for other orthopedic aftercare: Secondary | ICD-10-CM | POA: Diagnosis not present

## 2017-07-27 DIAGNOSIS — Z4789 Encounter for other orthopedic aftercare: Secondary | ICD-10-CM | POA: Diagnosis not present

## 2017-07-27 DIAGNOSIS — K760 Fatty (change of) liver, not elsewhere classified: Secondary | ICD-10-CM | POA: Diagnosis not present

## 2017-07-27 DIAGNOSIS — Z4589 Encounter for adjustment and management of other implanted devices: Secondary | ICD-10-CM | POA: Diagnosis not present

## 2017-07-27 DIAGNOSIS — Z79899 Other long term (current) drug therapy: Secondary | ICD-10-CM | POA: Diagnosis not present

## 2017-07-27 DIAGNOSIS — K219 Gastro-esophageal reflux disease without esophagitis: Secondary | ICD-10-CM | POA: Diagnosis not present

## 2017-07-27 DIAGNOSIS — E785 Hyperlipidemia, unspecified: Secondary | ICD-10-CM | POA: Diagnosis not present

## 2017-07-27 DIAGNOSIS — R51 Headache: Secondary | ICD-10-CM | POA: Diagnosis not present

## 2017-07-27 DIAGNOSIS — N183 Chronic kidney disease, stage 3 (moderate): Secondary | ICD-10-CM | POA: Diagnosis not present

## 2017-07-27 DIAGNOSIS — E1142 Type 2 diabetes mellitus with diabetic polyneuropathy: Secondary | ICD-10-CM | POA: Diagnosis not present

## 2017-07-27 DIAGNOSIS — Z87891 Personal history of nicotine dependence: Secondary | ICD-10-CM | POA: Diagnosis not present

## 2017-07-27 DIAGNOSIS — Z4689 Encounter for fitting and adjustment of other specified devices: Secondary | ICD-10-CM | POA: Diagnosis not present

## 2017-07-27 DIAGNOSIS — D631 Anemia in chronic kidney disease: Secondary | ICD-10-CM | POA: Diagnosis not present

## 2017-07-27 DIAGNOSIS — Z794 Long term (current) use of insulin: Secondary | ICD-10-CM | POA: Diagnosis not present

## 2017-07-27 DIAGNOSIS — Z6841 Body Mass Index (BMI) 40.0 and over, adult: Secondary | ICD-10-CM | POA: Diagnosis not present

## 2017-07-27 DIAGNOSIS — I129 Hypertensive chronic kidney disease with stage 1 through stage 4 chronic kidney disease, or unspecified chronic kidney disease: Secondary | ICD-10-CM | POA: Diagnosis not present

## 2017-07-27 DIAGNOSIS — E1122 Type 2 diabetes mellitus with diabetic chronic kidney disease: Secondary | ICD-10-CM | POA: Diagnosis not present

## 2017-08-08 DIAGNOSIS — T849XXA Unspecified complication of internal orthopedic prosthetic device, implant and graft, initial encounter: Secondary | ICD-10-CM | POA: Diagnosis not present

## 2017-08-16 DIAGNOSIS — T849XXD Unspecified complication of internal orthopedic prosthetic device, implant and graft, subsequent encounter: Secondary | ICD-10-CM | POA: Diagnosis not present

## 2017-08-30 DIAGNOSIS — D509 Iron deficiency anemia, unspecified: Secondary | ICD-10-CM | POA: Diagnosis not present

## 2017-08-30 DIAGNOSIS — I10 Essential (primary) hypertension: Secondary | ICD-10-CM | POA: Diagnosis not present

## 2017-08-30 DIAGNOSIS — N183 Chronic kidney disease, stage 3 (moderate): Secondary | ICD-10-CM | POA: Diagnosis not present

## 2017-08-30 DIAGNOSIS — T849XXD Unspecified complication of internal orthopedic prosthetic device, implant and graft, subsequent encounter: Secondary | ICD-10-CM | POA: Diagnosis not present

## 2017-08-30 DIAGNOSIS — M14672 Charcot's joint, left ankle and foot: Secondary | ICD-10-CM | POA: Diagnosis not present

## 2017-08-30 DIAGNOSIS — Z4789 Encounter for other orthopedic aftercare: Secondary | ICD-10-CM | POA: Diagnosis not present

## 2017-08-30 DIAGNOSIS — R809 Proteinuria, unspecified: Secondary | ICD-10-CM | POA: Diagnosis not present

## 2017-08-30 DIAGNOSIS — E559 Vitamin D deficiency, unspecified: Secondary | ICD-10-CM | POA: Diagnosis not present

## 2017-09-07 DIAGNOSIS — T849XXA Unspecified complication of internal orthopedic prosthetic device, implant and graft, initial encounter: Secondary | ICD-10-CM | POA: Diagnosis not present

## 2017-09-08 DIAGNOSIS — E1129 Type 2 diabetes mellitus with other diabetic kidney complication: Secondary | ICD-10-CM | POA: Diagnosis not present

## 2017-09-08 DIAGNOSIS — N184 Chronic kidney disease, stage 4 (severe): Secondary | ICD-10-CM | POA: Diagnosis not present

## 2017-09-08 DIAGNOSIS — E875 Hyperkalemia: Secondary | ICD-10-CM | POA: Diagnosis not present

## 2017-09-08 DIAGNOSIS — I10 Essential (primary) hypertension: Secondary | ICD-10-CM | POA: Diagnosis not present

## 2017-09-10 ENCOUNTER — Telehealth: Payer: Self-pay | Admitting: Internal Medicine

## 2017-09-10 MED ORDER — BASAGLAR KWIKPEN 100 UNIT/ML ~~LOC~~ SOPN: 25.0000 [IU] | PEN_INJECTOR | Freq: Every day | SUBCUTANEOUS | 1 refills | Status: DC

## 2017-09-10 NOTE — Telephone Encounter (Signed)
Done

## 2017-09-10 NOTE — Telephone Encounter (Signed)
Insulin Glargine (BASAGLAR KWIKPEN) 100 UNIT/ML SOPN   Patient stated dr gave him sample of medication and he would like this sent into the pharmacy     [id number]- [place], [place] - [address]

## 2017-09-15 DIAGNOSIS — Z794 Long term (current) use of insulin: Secondary | ICD-10-CM | POA: Diagnosis not present

## 2017-09-15 DIAGNOSIS — E785 Hyperlipidemia, unspecified: Secondary | ICD-10-CM | POA: Diagnosis not present

## 2017-09-15 DIAGNOSIS — E11610 Type 2 diabetes mellitus with diabetic neuropathic arthropathy: Secondary | ICD-10-CM | POA: Diagnosis not present

## 2017-09-15 DIAGNOSIS — N183 Chronic kidney disease, stage 3 (moderate): Secondary | ICD-10-CM | POA: Diagnosis not present

## 2017-09-15 DIAGNOSIS — T8484XA Pain due to internal orthopedic prosthetic devices, implants and grafts, initial encounter: Secondary | ICD-10-CM | POA: Diagnosis not present

## 2017-09-15 DIAGNOSIS — M14671 Charcot's joint, right ankle and foot: Secondary | ICD-10-CM | POA: Diagnosis not present

## 2017-09-15 DIAGNOSIS — E1122 Type 2 diabetes mellitus with diabetic chronic kidney disease: Secondary | ICD-10-CM | POA: Diagnosis not present

## 2017-09-15 DIAGNOSIS — I129 Hypertensive chronic kidney disease with stage 1 through stage 4 chronic kidney disease, or unspecified chronic kidney disease: Secondary | ICD-10-CM | POA: Diagnosis not present

## 2017-09-15 DIAGNOSIS — M14672 Charcot's joint, left ankle and foot: Secondary | ICD-10-CM | POA: Diagnosis not present

## 2017-09-15 DIAGNOSIS — Z4889 Encounter for other specified surgical aftercare: Secondary | ICD-10-CM | POA: Diagnosis not present

## 2017-09-15 DIAGNOSIS — Z4789 Encounter for other orthopedic aftercare: Secondary | ICD-10-CM | POA: Diagnosis not present

## 2017-09-15 DIAGNOSIS — Z79899 Other long term (current) drug therapy: Secondary | ICD-10-CM | POA: Diagnosis not present

## 2017-09-15 DIAGNOSIS — Z87891 Personal history of nicotine dependence: Secondary | ICD-10-CM | POA: Diagnosis not present

## 2017-09-20 ENCOUNTER — Telehealth: Payer: Self-pay

## 2017-09-20 ENCOUNTER — Other Ambulatory Visit: Payer: Self-pay

## 2017-09-20 DIAGNOSIS — N185 Chronic kidney disease, stage 5: Secondary | ICD-10-CM

## 2017-09-20 MED ORDER — PEN NEEDLES 32G X 4 MM MISC: 1.0000 | Freq: Every day | 0 refills | Status: DC

## 2017-09-20 NOTE — Telephone Encounter (Signed)
Patient called today to request basaglar be sent to the pharmacy- I made patient aware that this was ordered on 09/10/17 and he stated the pharmacy did not receive it- I requested the patient call the pharmacy to find out what happened to the first prescription

## 2017-09-27 DIAGNOSIS — I10 Essential (primary) hypertension: Secondary | ICD-10-CM | POA: Diagnosis not present

## 2017-09-27 DIAGNOSIS — Z4789 Encounter for other orthopedic aftercare: Secondary | ICD-10-CM | POA: Diagnosis not present

## 2017-09-27 DIAGNOSIS — M14672 Charcot's joint, left ankle and foot: Secondary | ICD-10-CM | POA: Diagnosis not present

## 2017-10-08 DIAGNOSIS — T849XXA Unspecified complication of internal orthopedic prosthetic device, implant and graft, initial encounter: Secondary | ICD-10-CM | POA: Diagnosis not present

## 2017-10-11 DIAGNOSIS — M19079 Primary osteoarthritis, unspecified ankle and foot: Secondary | ICD-10-CM | POA: Diagnosis not present

## 2017-10-11 DIAGNOSIS — M14672 Charcot's joint, left ankle and foot: Secondary | ICD-10-CM | POA: Diagnosis not present

## 2017-10-11 DIAGNOSIS — I10 Essential (primary) hypertension: Secondary | ICD-10-CM | POA: Diagnosis not present

## 2017-10-13 ENCOUNTER — Other Ambulatory Visit: Payer: Self-pay | Admitting: Internal Medicine

## 2017-10-15 DIAGNOSIS — R809 Proteinuria, unspecified: Secondary | ICD-10-CM | POA: Diagnosis not present

## 2017-10-15 DIAGNOSIS — D509 Iron deficiency anemia, unspecified: Secondary | ICD-10-CM | POA: Diagnosis not present

## 2017-10-15 DIAGNOSIS — I10 Essential (primary) hypertension: Secondary | ICD-10-CM | POA: Diagnosis not present

## 2017-10-15 DIAGNOSIS — Z79899 Other long term (current) drug therapy: Secondary | ICD-10-CM | POA: Diagnosis not present

## 2017-10-15 DIAGNOSIS — N183 Chronic kidney disease, stage 3 (moderate): Secondary | ICD-10-CM | POA: Diagnosis not present

## 2017-10-15 DIAGNOSIS — E559 Vitamin D deficiency, unspecified: Secondary | ICD-10-CM | POA: Diagnosis not present

## 2017-10-20 DIAGNOSIS — D638 Anemia in other chronic diseases classified elsewhere: Secondary | ICD-10-CM | POA: Diagnosis not present

## 2017-10-20 DIAGNOSIS — N184 Chronic kidney disease, stage 4 (severe): Secondary | ICD-10-CM | POA: Diagnosis not present

## 2017-10-20 DIAGNOSIS — R809 Proteinuria, unspecified: Secondary | ICD-10-CM | POA: Diagnosis not present

## 2017-10-20 DIAGNOSIS — E872 Acidosis: Secondary | ICD-10-CM | POA: Diagnosis not present

## 2017-11-02 ENCOUNTER — Ambulatory Visit (HOSPITAL_COMMUNITY)
Admission: RE | Admit: 2017-11-02 | Discharge: 2017-11-02 | Disposition: A | Discharge status: Home / Self Care | Payer: BLUE CROSS/BLUE SHIELD | Source: Ambulatory Visit | Attending: Vascular Surgery | Admitting: Vascular Surgery

## 2017-11-02 ENCOUNTER — Ambulatory Visit (INDEPENDENT_AMBULATORY_CARE_PROVIDER_SITE_OTHER)
Admission: RE | Admit: 2017-11-02 | Discharge: 2017-11-02 | Disposition: A | Discharge status: Home / Self Care | Payer: BLUE CROSS/BLUE SHIELD | Source: Ambulatory Visit | Attending: Vascular Surgery | Admitting: Vascular Surgery

## 2017-11-02 ENCOUNTER — Encounter: Payer: Self-pay | Admitting: Vascular Surgery

## 2017-11-02 ENCOUNTER — Encounter: Payer: Self-pay | Admitting: *Deleted

## 2017-11-02 ENCOUNTER — Other Ambulatory Visit: Payer: Self-pay | Admitting: *Deleted

## 2017-11-02 ENCOUNTER — Ambulatory Visit (INDEPENDENT_AMBULATORY_CARE_PROVIDER_SITE_OTHER): Payer: BLUE CROSS/BLUE SHIELD | Admitting: Vascular Surgery

## 2017-11-02 VITALS — BP 146/90 | HR 85 | Temp 97.5°F | Resp 18 | Ht 71.0 in | Wt 308.0 lb

## 2017-11-02 DIAGNOSIS — N185 Chronic kidney disease, stage 5: Secondary | ICD-10-CM

## 2017-11-02 NOTE — Progress Notes (Signed)
[facility] [place]  Patient name: Reginald Carpenter MRN: [id number] DOB: 1965-05-17 Sex: male  REASON FOR CONSULT: Discuss access for hemodialysis  HPI: Reginald Carpenter is a 52 y.o. male, who is here today for discussion of access for hemodialysis.  He is here today with his wife.  He has had progressive renal insufficiency related to hypertension and diabetes.  He has never been on dialysis.  Has not had a pacemaker.  Has not had prior central venous catheterization.  He is left-handed.  Past Medical History:  Diagnosis Date  . Chronic kidney disease   . Diabetes mellitus without complication (HCC)   . High cholesterol   . Hypertension     Family History  Problem Relation Age of Onset  . Heart failure Mother     SOCIAL HISTORY: Social History   Socioeconomic History  . Marital status: Married    Spouse name: Not on file  . Number of children: Not on file  . Years of education: Not on file  . Highest education level: Not on file  Occupational History  . Not on file  Social Needs  . Financial resource strain: Not on file  . Food insecurity:    Worry: Not on file    Inability: Not on file  . Transportation needs:    Medical: Not on file    Non-medical: Not on file  Tobacco Use  . Smoking status: Former Smoker    Years: 10.00  . Smokeless tobacco: Never Used  . Tobacco comment: stopped 25 yrs ago  Substance and Sexual Activity  . Alcohol use: No  . Drug use: No  . Sexual activity: Not on file  Lifestyle  . Physical activity:    Days per week: Not on file    Minutes per session: Not on file  . Stress: Not on file  Relationships  . Social connections:    Talks on phone: Not on file    Gets together: Not on file    Attends religious service: Not on file    Active member of club or organization: Not on file    Attends meetings of clubs or organizations: Not on file    Relationship status: Not on file  .  Intimate partner violence:    Fear of current or ex partner: Not on file    Emotionally abused: Not on file    Physically abused: Not on file    Forced sexual activity: Not on file  Other Topics Concern  . Not on file  Social History Narrative  . Not on file    No Known Allergies  Current Outpatient Medications  Medication Sig Dispense Refill  . amLODipine (NORVASC) 10 MG tablet Take 1 tablet by mouth daily.    Marland Kitchen atorvastatin (LIPITOR) 80 MG tablet Take 80 mg by mouth every morning.    . calcium acetate (PHOSLO) 667 MG capsule Take by mouth.    . furosemide (LASIX) 20 MG tablet Take 20 mg by mouth 2 (two) times daily.  4  . glimepiride (AMARYL) 2 MG tablet 1 TABLET WITH BREAKFAST OR THE FIRST MAIN MEAL OF THE DAY ONCE A DAY ORALLY 90 90 tablet 0  . Insulin Glargine (BASAGLAR KWIKPEN) 100 UNIT/ML SOPN Inject 0.25 mLs (25 Units total) into the skin at bedtime. 25 mL 1  . Insulin Pen Needle (PEN NEEDLES) 32G X 4 MM MISC 1 each by Does not apply route daily. With Basaglar pen 100 each 0  .  lisinopril (PRINIVIL,ZESTRIL) 20 MG tablet Take 20 mg by mouth daily.  3  . sodium bicarbonate 650 MG tablet Take 650 mg by mouth 3 (three) times daily.    . DULoxetine (CYMBALTA) 30 MG capsule Take 1 capsule by mouth daily.    . Vitamin D, Ergocalciferol, (DRISDOL) 50000 units CAPS capsule Take 1 capsule by mouth every 30 (thirty) days.  0   No current facility-administered medications for this visit.     REVIEW OF SYSTEMS:  [X]  denotes positive finding, [ ]  denotes negative finding Cardiac  Comments:  Chest pain or chest pressure:    Shortness of breath upon exertion:    Short of breath when lying flat:    Irregular heart rhythm:        Vascular    Pain in calf, thigh, or hip brought on by ambulation:    Pain in feet at night that wakes you up from your sleep:     Blood clot in your veins:    Leg swelling:         Pulmonary    Oxygen at home:    Productive cough:     Wheezing:           Neurologic    Sudden weakness in arms or legs:     Sudden numbness in arms or legs:     Sudden onset of difficulty speaking or slurred speech:    Temporary loss of vision in one eye:     Problems with dizziness:         Gastrointestinal    Blood in stool:     Vomited blood:         Genitourinary    Burning when urinating:     Blood in urine:        Psychiatric    Major depression:         Hematologic    Bleeding problems:    Problems with blood clotting too easily:        Skin    Rashes or ulcers:        Constitutional    Fever or chills:      PHYSICAL EXAM: Vitals:   11/02/17 1316  BP: (!) 146/90  Pulse: 85  Resp: 18  Temp: (!) 97.5 F (36.4 C)  TempSrc: Oral  SpO2: 98%  Weight: (!) 308 lb (139.7 kg)  Height: 5\' 11"  (1.803 m)    GENERAL: The patient is a well-nourished male, in no acute distress. The vital signs are documented above. CARDIOVASCULAR: 2+ radial and 2+ ulnar pulses bilaterally.  No large visualized surface veins in his upper extremities PULMONARY: There is good air exchange  ABDOMEN: Soft and non-tender  MUSCULOSKELETAL: There are no major deformities or cyanosis.  Does have a cast on his left foot related to orthopedic issues nEUROLOGIC: No focal weakness or paresthesias are detected. SKIN: There are no ulcers or rashes noted. PSYCHIATRIC: The patient has a normal affect.  DATA:  Arterial studies reveal normal triphasic brachial, radial and ulnar signals bilaterally.  He does have a high bifurcation of his brachial artery just distal to the axilla.  Venous imaging revealed small to moderate cephalic vein in the right forearm and large antecubital vein and more proximal vein right upper arm  MEDICAL ISSUES: Had a long discussion with the patient regarding options for hemodialysis.  I discussed temporary catheter, AV graft and AV fistula.  He does appear to be a good candidate for a right upper arm  AV fistula creation.  He is left-handed.  I  explained that this would be imaged at the time of procedure.  His right forearm cephalic vein appears to be small and therefore in all likelihood is not a forearm candidate but this will be interrogated at the time of surgery.  I described the procedure as an outpatient.  Also described the potential for non-maturation and also discussed the possibility of steal syndrome.  We will schedule this at his her earliest convenience   Larina Earthly, MD [facility] Tel [phone] Pager [phone]

## 2017-11-02 NOTE — Progress Notes (Signed)
Surgery re-scheduled with Dr. Edilia Boickson on 11/09/17 [facility] 6[facility] am. All other pre-op instructions unchanged. Verbalized understanding. Wife with patient.

## 2017-11-02 NOTE — H&P (View-Only) (Signed)
  [facility] [place]  Patient name: Reginald Carpenter MRN: [id number] DOB: 05/15/1965 Sex: male  REASON FOR CONSULT: Discuss access for hemodialysis  HPI: Reginald Carpenter is a 52 y.o. male, who is here today for discussion of access for hemodialysis.  He is here today with his wife.  He has had progressive renal insufficiency related to hypertension and diabetes.  He has never been on dialysis.  Has not had a pacemaker.  Has not had prior central venous catheterization.  He is left-handed.  Past Medical History:  Diagnosis Date  . Chronic kidney disease   . Diabetes mellitus without complication (HCC)   . High cholesterol   . Hypertension     Family History  Problem Relation Age of Onset  . Heart failure Mother     SOCIAL HISTORY: Social History   Socioeconomic History  . Marital status: Married    Spouse name: Not on file  . Number of children: Not on file  . Years of education: Not on file  . Highest education level: Not on file  Occupational History  . Not on file  Social Needs  . Financial resource strain: Not on file  . Food insecurity:    Worry: Not on file    Inability: Not on file  . Transportation needs:    Medical: Not on file    Non-medical: Not on file  Tobacco Use  . Smoking status: Former Smoker    Years: 10.00  . Smokeless tobacco: Never Used  . Tobacco comment: stopped 25 yrs ago  Substance and Sexual Activity  . Alcohol use: No  . Drug use: No  . Sexual activity: Not on file  Lifestyle  . Physical activity:    Days per week: Not on file    Minutes per session: Not on file  . Stress: Not on file  Relationships  . Social connections:    Talks on phone: Not on file    Gets together: Not on file    Attends religious service: Not on file    Active member of club or organization: Not on file    Attends meetings of clubs or organizations: Not on file    Relationship status: Not on file  .  Intimate partner violence:    Fear of current or ex partner: Not on file    Emotionally abused: Not on file    Physically abused: Not on file    Forced sexual activity: Not on file  Other Topics Concern  . Not on file  Social History Narrative  . Not on file    No Known Allergies  Current Outpatient Medications  Medication Sig Dispense Refill  . amLODipine (NORVASC) 10 MG tablet Take 1 tablet by mouth daily.    . atorvastatin (LIPITOR) 80 MG tablet Take 80 mg by mouth every morning.    . calcium acetate (PHOSLO) 667 MG capsule Take by mouth.    . furosemide (LASIX) 20 MG tablet Take 20 mg by mouth 2 (two) times daily.  4  . glimepiride (AMARYL) 2 MG tablet 1 TABLET WITH BREAKFAST OR THE FIRST MAIN MEAL OF THE DAY ONCE A DAY ORALLY 90 90 tablet 0  . Insulin Glargine (BASAGLAR KWIKPEN) 100 UNIT/ML SOPN Inject 0.25 mLs (25 Units total) into the skin at bedtime. 25 mL 1  . Insulin Pen Needle (PEN NEEDLES) 32G X 4 MM MISC 1 each by Does not apply route daily. With Basaglar pen 100 each 0  .   lisinopril (PRINIVIL,ZESTRIL) 20 MG tablet Take 20 mg by mouth daily.  3  . sodium bicarbonate 650 MG tablet Take 650 mg by mouth 3 (three) times daily.    . DULoxetine (CYMBALTA) 30 MG capsule Take 1 capsule by mouth daily.    . Vitamin D, Ergocalciferol, (DRISDOL) 50000 units CAPS capsule Take 1 capsule by mouth every 30 (thirty) days.  0   No current facility-administered medications for this visit.     REVIEW OF SYSTEMS:  [X] denotes positive finding, [ ] denotes negative finding Cardiac  Comments:  Chest pain or chest pressure:    Shortness of breath upon exertion:    Short of breath when lying flat:    Irregular heart rhythm:        Vascular    Pain in calf, thigh, or hip brought on by ambulation:    Pain in feet at night that wakes you up from your sleep:     Blood clot in your veins:    Leg swelling:         Pulmonary    Oxygen at home:    Productive cough:     Wheezing:           Neurologic    Sudden weakness in arms or legs:     Sudden numbness in arms or legs:     Sudden onset of difficulty speaking or slurred speech:    Temporary loss of vision in one eye:     Problems with dizziness:         Gastrointestinal    Blood in stool:     Vomited blood:         Genitourinary    Burning when urinating:     Blood in urine:        Psychiatric    Major depression:         Hematologic    Bleeding problems:    Problems with blood clotting too easily:        Skin    Rashes or ulcers:        Constitutional    Fever or chills:      PHYSICAL EXAM: Vitals:   11/02/17 1316  BP: (!) 146/90  Pulse: 85  Resp: 18  Temp: (!) 97.5 F (36.4 C)  TempSrc: Oral  SpO2: 98%  Weight: (!) 308 lb (139.7 kg)  Height: 5' 11" (1.803 m)    GENERAL: The patient is a well-nourished male, in no acute distress. The vital signs are documented above. CARDIOVASCULAR: 2+ radial and 2+ ulnar pulses bilaterally.  No large visualized surface veins in his upper extremities PULMONARY: There is good air exchange  ABDOMEN: Soft and non-tender  MUSCULOSKELETAL: There are no major deformities or cyanosis.  Does have a cast on his left foot related to orthopedic issues nEUROLOGIC: No focal weakness or paresthesias are detected. SKIN: There are no ulcers or rashes noted. PSYCHIATRIC: The patient has a normal affect.  DATA:  Arterial studies reveal normal triphasic brachial, radial and ulnar signals bilaterally.  He does have a high bifurcation of his brachial artery just distal to the axilla.  Venous imaging revealed small to moderate cephalic vein in the right forearm and large antecubital vein and more proximal vein right upper arm  MEDICAL ISSUES: Had a long discussion with the patient regarding options for hemodialysis.  I discussed temporary catheter, AV graft and AV fistula.  He does appear to be a good candidate for a right upper arm   AV fistula creation.  He is left-handed.  I  explained that this would be imaged at the time of procedure.  His right forearm cephalic vein appears to be small and therefore in all likelihood is not a forearm candidate but this will be interrogated at the time of surgery.  I described the procedure as an outpatient.  Also described the potential for non-maturation and also discussed the possibility of steal syndrome.  We will schedule this at his her earliest convenience   Todd F. Early, MD FACS [facility] [place] Office Tel [phone] Pager [phone]    

## 2017-11-03 ENCOUNTER — Encounter: Payer: Self-pay | Admitting: Nephrology

## 2017-11-04 DIAGNOSIS — I10 Essential (primary) hypertension: Secondary | ICD-10-CM | POA: Diagnosis not present

## 2017-11-04 DIAGNOSIS — M14672 Charcot's joint, left ankle and foot: Secondary | ICD-10-CM | POA: Diagnosis not present

## 2017-11-05 ENCOUNTER — Encounter (HOSPITAL_COMMUNITY): Payer: Self-pay | Admitting: *Deleted

## 2017-11-05 ENCOUNTER — Other Ambulatory Visit: Payer: Self-pay

## 2017-11-05 MED ORDER — DEXTROSE 5 % IV SOLN
3.0000 g | INTRAVENOUS | Status: AC
  Administered 2017-11-09: 3 g via INTRAVENOUS
  Filled 2017-11-05: qty 3

## 2017-11-05 NOTE — Progress Notes (Signed)
   11/05/17 1449  OBSTRUCTIVE SLEEP APNEA  Have you ever been diagnosed with sleep apnea through a sleep study? No  Do you snore loudly (loud enough to be heard through closed doors)?  1  Do you often feel tired, fatigued, or sleepy during the daytime (such as falling asleep during driving or talking to someone)? 0  Has anyone observed you stop breathing during your sleep? 0  Do you have, or are you being treated for high blood pressure? 1  BMI more than 35 kg/m2? 1  Age > 50 (1-yes) 1  Male Gender (Yes=1) 1  Obstructive Sleep Apnea Score 5  Score 5 or greater  Results sent to PCP

## 2017-11-05 NOTE — Progress Notes (Signed)
Patient given pre-op instructions  7 days prior to surgery STOP taking any Aspirin(unless otherwise instructed by your surgeon), Aleve, Naproxen, Ibuprofen, Motrin, Advil, Goody's, BC's, all herbal medications, fish oil, and all vitamins  No glimepiride morning of surgery and no insulin the night before surgery  Blood sugar < 70 to drink apple or cranberry juice recheck in 15 minutes and to call us if not greater than 70  Patient denies having cardiologist but has had multiple cardiac studies, anesthesia requested to review chart

## 2017-11-05 NOTE — Progress Notes (Signed)
Anesthesia Chart Review: SAME DAY WORKUP   Case:  [id number] Date/Time:  11/09/17 0920   Procedure:  ARTERIOVENOUS (AV) FISTULA CREATION RIGHT UPPER ARM (Right )   Anesthesia type:  Monitor Anesthesia Care   Pre-op diagnosis:  CHRONIC KIDNEY DISEASE FOR HEMODIALYSISI ACCESS   Location:  [facility] ROOM 10 / [facility]   Surgeon:  Chuck Hint, MD      DISCUSSION: 52 yo male former smoker for above procedure. Pertinent hx includes HTN, DMII, Neuropathy, ESRD not yet on dialysis.   Pt had previous workup for chest pain in 2018 which was ultimately felt to be atypical. Echo showed EF 55-60% with normal wall motion, mild LVH. Stress test showed normal wall motion, normal perfusion, EF 62%.  Anticipate he can proceed with surgery as planned barring acute status change.  VS: There were no vitals taken for this visit.  PROVIDERS: Daisy Floro, MD is PCP  Salomon Mast, MD is Nephrologist  LABS: Will need DOS labs.   IMAGES: N/A   EKG: Will need EKG DOS  07/28/2016 (narrative results in care everywhere, no tracing): Sinus tach (104bpm), nonspecific t wave abnormality in lateral leads.  CV: Pharmacologic stress 07/29/2016: FINDINGS:  #  There is normal distribution of cardiac activity. Some artifact from bowel. #  Wall motion appears normal with no regional wall motion abnormalities. #  Left ventricular ejection fraction:  see below.  IMPRESSION:   Normal perfusion exam. Normal wall motion exam. Calculated LVEF of  62 %.  TTE 07/28/2016: Interpretation Summary A complete two-dimensional transthoracic echocardiogram with color flow Doppler and spectral Doppler was performed. Definity contrast injection performed. The left ventricle is normal in size. Mild aortic sclerosis is present with good valvular opening. The left ventricular diastolic function is normal. There is mild concentric left ventricular hypertrophy. The left ventricular ejection fraction is normal  (55-60%). The left ventricular wall motion is normal. The left atrium is mildly dilated.  Left Ventricle The left ventricle is normal in size. There is mild concentric left ventricular hypertrophy. The left ventricular ejection fraction is normal (55-60%). The left ventricular wall motion is normal. The left ventricular diastolic function is normal.   Right Ventricle The right ventricle is grossly normal in size and function.  Atria The left atrium is mildly dilated. The right atrium is normal.  Mitral Valve The mitral valve is grossly normal. There is no mitral regurgitation.   Tricuspid Valve The tricuspid valve is not well visualized, unable to adequately assess function.  Aortic Valve Mild aortic sclerosis is present with good valvular opening. There is no aortic regurgitation present.  Pulmonic Valve The pulmonic valve is not well visualized, unable to adequately assess function.  Vessels The aortic root is normal in diameter.  Pericardium There is no pericardial effusion.  Past Medical History:  Diagnosis Date  . 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 Chronic kidney disease   . Diabetes mellitus without complication (HCC)    type 2  . High cholesterol   . Hypertension   . Neuropathy     Past Surgical History:  Procedure Laterality Date  . ANKLE SURGERY     multiple ankle surgeries, with hardware and hardware removal at [facility]  . CARPAL TUNNEL RELEASE Bilateral   . COLONOSCOPY      MEDICATIONS: . [START ON 11/09/2017] ceFAZolin (ANCEF) 3 g in dextrose 5 % 50 mL IVPB   . amLODipine (NORVASC) 10 MG tablet  .  atorvastatin (LIPITOR) 80 MG tablet  . calcium acetate (PHOSLO) 667 MG capsule  . DULoxetine (CYMBALTA) 30 MG capsule  . furosemide (LASIX) 20 MG tablet  . glimepiride (AMARYL) 2 MG tablet  . Insulin Glargine (BASAGLAR KWIKPEN) 100 UNIT/ML SOPN  . lisinopril (PRINIVIL,ZESTRIL) 20 MG tablet  . sodium bicarbonate 650 MG  tablet  . Vitamin D, Ergocalciferol, (DRISDOL) 50000 units CAPS capsule  . Insulin Pen Needle (PEN NEEDLES) 32G X 4 MM MISC     Zannie CoveJames Deja Kaigler, PA-C [facility] Phone [phone] 11/05/2017 3:45 PM

## 2017-11-08 DIAGNOSIS — T849XXA Unspecified complication of internal orthopedic prosthetic device, implant and graft, initial encounter: Secondary | ICD-10-CM | POA: Diagnosis not present

## 2017-11-08 NOTE — Anesthesia Preprocedure Evaluation (Addendum)
Anesthesia Evaluation  Patient identified by MRN, date of birth, ID band Patient awake    Reviewed: Allergy & Precautions, NPO status , Patient's Chart, lab work & pertinent test results  Airway Mallampati: II  TM Distance: >3 FB     Dental   Pulmonary former smoker,    breath sounds clear to auscultation       Cardiovascular hypertension,  Rhythm:Regular Rate:Normal     Neuro/Psych    GI/Hepatic   Endo/Other  diabetes  Renal/GU Renal disease     Musculoskeletal  (+) Arthritis ,   Abdominal   Peds  Hematology   Anesthesia Other Findings   Reproductive/Obstetrics                            Anesthesia Physical Anesthesia Plan  ASA: III  Anesthesia Plan: MAC   Post-op Pain Management:    Induction: Intravenous  PONV Risk Score and Plan: Treatment may vary due to age or medical condition  Airway Management Planned: Simple Face Mask and Nasal Cannula  Additional Equipment:   Intra-op Plan:   Post-operative Plan:   Informed Consent: I have reviewed the patients History and Physical, chart, labs and discussed the procedure including the risks, benefits and alternatives for the proposed anesthesia with the patient or authorized representative who has indicated his/her understanding and acceptance.     Plan Discussed with: CRNA and Anesthesiologist  Anesthesia Plan Comments:         Anesthesia Quick Evaluation

## 2017-11-09 ENCOUNTER — Encounter (HOSPITAL_COMMUNITY)
Admission: RE | Disposition: A | Discharge status: Home / Self Care | Payer: Self-pay | Source: Ambulatory Visit | Attending: Vascular Surgery

## 2017-11-09 ENCOUNTER — Encounter (HOSPITAL_COMMUNITY): Payer: Self-pay | Admitting: Surgery

## 2017-11-09 ENCOUNTER — Ambulatory Visit (HOSPITAL_COMMUNITY): Payer: BLUE CROSS/BLUE SHIELD | Admitting: Physician Assistant

## 2017-11-09 ENCOUNTER — Ambulatory Visit (HOSPITAL_COMMUNITY)
Admission: RE | Admit: 2017-11-09 | Discharge: 2017-11-09 | Disposition: A | Discharge status: Home / Self Care | Payer: BLUE CROSS/BLUE SHIELD | Source: Ambulatory Visit | Attending: Vascular Surgery | Admitting: Vascular Surgery

## 2017-11-09 ENCOUNTER — Other Ambulatory Visit: Payer: Self-pay

## 2017-11-09 DIAGNOSIS — Z794 Long term (current) use of insulin: Secondary | ICD-10-CM | POA: Diagnosis not present

## 2017-11-09 DIAGNOSIS — Z8249 Family history of ischemic heart disease and other diseases of the circulatory system: Secondary | ICD-10-CM | POA: Diagnosis not present

## 2017-11-09 DIAGNOSIS — E1122 Type 2 diabetes mellitus with diabetic chronic kidney disease: Secondary | ICD-10-CM | POA: Insufficient documentation

## 2017-11-09 DIAGNOSIS — Z79899 Other long term (current) drug therapy: Secondary | ICD-10-CM | POA: Diagnosis not present

## 2017-11-09 DIAGNOSIS — N185 Chronic kidney disease, stage 5: Secondary | ICD-10-CM | POA: Insufficient documentation

## 2017-11-09 DIAGNOSIS — N189 Chronic kidney disease, unspecified: Secondary | ICD-10-CM | POA: Diagnosis not present

## 2017-11-09 DIAGNOSIS — E7800 Pure hypercholesterolemia, unspecified: Secondary | ICD-10-CM | POA: Diagnosis not present

## 2017-11-09 DIAGNOSIS — I120 Hypertensive chronic kidney disease with stage 5 chronic kidney disease or end stage renal disease: Secondary | ICD-10-CM | POA: Insufficient documentation

## 2017-11-09 DIAGNOSIS — Z87891 Personal history of nicotine dependence: Secondary | ICD-10-CM | POA: Insufficient documentation

## 2017-11-09 DIAGNOSIS — I129 Hypertensive chronic kidney disease with stage 1 through stage 4 chronic kidney disease, or unspecified chronic kidney disease: Secondary | ICD-10-CM | POA: Diagnosis not present

## 2017-11-09 HISTORY — PX: AV FISTULA PLACEMENT: SHX1204

## 2017-11-09 HISTORY — DX: Polyneuropathy, unspecified: G62.9

## 2017-11-09 HISTORY — DX: Unspecified osteoarthritis, unspecified site: M19.90

## 2017-11-09 LAB — HEMOGLOBIN A1C
Hgb A1c MFr Bld: 6.9 % — ABNORMAL HIGH (ref 4.8–5.6)
Mean Plasma Glucose: 151.33 mg/dL

## 2017-11-09 LAB — GLUCOSE, CAPILLARY: Glucose-Capillary: 146 mg/dL — ABNORMAL HIGH (ref 70–99)

## 2017-11-09 LAB — POCT I-STAT 4, (NA,K, GLUC, HGB,HCT)
GLUCOSE: 168 mg/dL — AB (ref 70–99)
HEMATOCRIT: 35 % — AB (ref 39.0–52.0)
HEMOGLOBIN: 11.9 g/dL — AB (ref 13.0–17.0)
POTASSIUM: 4.5 mmol/L (ref 3.5–5.1)
SODIUM: 137 mmol/L (ref 135–145)

## 2017-11-09 SURGERY — ARTERIOVENOUS (AV) FISTULA CREATION
Anesthesia: Monitor Anesthesia Care | Site: Arm Upper | Laterality: Right

## 2017-11-09 MED ORDER — OXYCODONE HCL 5 MG PO TABS
5.0000 mg | ORAL_TABLET | Freq: Once | ORAL | Status: AC
  Administered 2017-11-09: 5 mg via ORAL

## 2017-11-09 MED ORDER — PROTAMINE SULFATE 10 MG/ML IV SOLN
INTRAVENOUS | Status: DC | PRN
  Administered 2017-11-09 (×3): 20 mg via INTRAVENOUS

## 2017-11-09 MED ORDER — ROCURONIUM BROMIDE 50 MG/5ML IV SOSY
PREFILLED_SYRINGE | INTRAVENOUS | Status: AC
  Filled 2017-11-09: qty 5

## 2017-11-09 MED ORDER — PROPOFOL 10 MG/ML IV BOLUS
INTRAVENOUS | Status: AC
  Filled 2017-11-09: qty 20

## 2017-11-09 MED ORDER — EPHEDRINE 5 MG/ML INJ
INTRAVENOUS | Status: AC
  Filled 2017-11-09: qty 10

## 2017-11-09 MED ORDER — OXYCODONE HCL 5 MG PO TABS: 5.0000 mg | ORAL_TABLET | ORAL | 0 refills | Status: DC | PRN

## 2017-11-09 MED ORDER — EPHEDRINE SULFATE-NACL 50-0.9 MG/10ML-% IV SOSY
PREFILLED_SYRINGE | INTRAVENOUS | Status: DC | PRN
  Administered 2017-11-09: 10 mg via INTRAVENOUS

## 2017-11-09 MED ORDER — SUCCINYLCHOLINE CHLORIDE 200 MG/10ML IV SOSY
PREFILLED_SYRINGE | INTRAVENOUS | Status: AC
  Filled 2017-11-09: qty 10

## 2017-11-09 MED ORDER — LIDOCAINE 2% (20 MG/ML) 5 ML SYRINGE
INTRAMUSCULAR | Status: AC
  Filled 2017-11-09: qty 5

## 2017-11-09 MED ORDER — CHLORHEXIDINE GLUCONATE 4 % EX LIQD: 60.0000 mL | Freq: Once | CUTANEOUS | Status: DC

## 2017-11-09 MED ORDER — 0.9 % SODIUM CHLORIDE (POUR BTL) OPTIME
TOPICAL | Status: DC | PRN
  Administered 2017-11-09: 1000 mL

## 2017-11-09 MED ORDER — LIDOCAINE HCL (PF) 1 % IJ SOLN
INTRAMUSCULAR | Status: AC
  Filled 2017-11-09: qty 30

## 2017-11-09 MED ORDER — PROTAMINE SULFATE 10 MG/ML IV SOLN
INTRAVENOUS | Status: AC
  Filled 2017-11-09: qty 25

## 2017-11-09 MED ORDER — MIDAZOLAM HCL 2 MG/2ML IJ SOLN
INTRAMUSCULAR | Status: AC
  Filled 2017-11-09: qty 2

## 2017-11-09 MED ORDER — LIDOCAINE-EPINEPHRINE (PF) 1 %-1:200000 IJ SOLN
INTRAMUSCULAR | Status: DC | PRN
  Administered 2017-11-09: 30 mL

## 2017-11-09 MED ORDER — SODIUM CHLORIDE 0.9 % IV SOLN
INTRAVENOUS | Status: AC
  Filled 2017-11-09: qty 1.2

## 2017-11-09 MED ORDER — HEPARIN SODIUM (PORCINE) 1000 UNIT/ML IJ SOLN
INTRAMUSCULAR | Status: AC
  Filled 2017-11-09: qty 1

## 2017-11-09 MED ORDER — SODIUM CHLORIDE 0.9 % IV SOLN
INTRAVENOUS | Status: DC | PRN
  Administered 2017-11-09: 50 ug/min via INTRAVENOUS

## 2017-11-09 MED ORDER — EPHEDRINE SULFATE-NACL 50-0.9 MG/10ML-% IV SOSY: PREFILLED_SYRINGE | INTRAVENOUS | Status: DC | PRN

## 2017-11-09 MED ORDER — SODIUM CHLORIDE 0.9 % IV SOLN
INTRAVENOUS | Status: DC
  Administered 2017-11-09 (×2): via INTRAVENOUS

## 2017-11-09 MED ORDER — LIDOCAINE HCL (PF) 1 % IJ SOLN
INTRAMUSCULAR | Status: DC | PRN
  Administered 2017-11-09: 30 mL

## 2017-11-09 MED ORDER — PHENYLEPHRINE 40 MCG/ML (10ML) SYRINGE FOR IV PUSH (FOR BLOOD PRESSURE SUPPORT)
PREFILLED_SYRINGE | INTRAVENOUS | Status: DC | PRN
  Administered 2017-11-09: 80 ug via INTRAVENOUS
  Administered 2017-11-09: 40 ug via INTRAVENOUS
  Administered 2017-11-09: 80 ug via INTRAVENOUS

## 2017-11-09 MED ORDER — SODIUM CHLORIDE 0.9 % IV SOLN
INTRAVENOUS | Status: DC | PRN
  Administered 2017-11-09: 500 mL

## 2017-11-09 MED ORDER — PAPAVERINE HCL 30 MG/ML IJ SOLN
INTRAMUSCULAR | Status: AC
  Filled 2017-11-09: qty 2

## 2017-11-09 MED ORDER — OXYCODONE HCL 5 MG PO TABS
ORAL_TABLET | ORAL | Status: AC
  Filled 2017-11-09: qty 1

## 2017-11-09 MED ORDER — ONDANSETRON HCL 4 MG/2ML IJ SOLN
INTRAMUSCULAR | Status: DC | PRN
  Administered 2017-11-09: 4 mg via INTRAVENOUS

## 2017-11-09 MED ORDER — HEPARIN SODIUM (PORCINE) 1000 UNIT/ML IJ SOLN
INTRAMUSCULAR | Status: DC | PRN
  Administered 2017-11-09: 12000 [IU] via INTRAVENOUS

## 2017-11-09 MED ORDER — FENTANYL CITRATE (PF) 100 MCG/2ML IJ SOLN
INTRAMUSCULAR | Status: AC
  Administered 2017-11-09: 25 ug via INTRAVENOUS
  Filled 2017-11-09: qty 2

## 2017-11-09 MED ORDER — MIDAZOLAM HCL 5 MG/5ML IJ SOLN
INTRAMUSCULAR | Status: DC | PRN
  Administered 2017-11-09: 2 mg via INTRAVENOUS

## 2017-11-09 MED ORDER — PHENYLEPHRINE 40 MCG/ML (10ML) SYRINGE FOR IV PUSH (FOR BLOOD PRESSURE SUPPORT)
PREFILLED_SYRINGE | INTRAVENOUS | Status: AC
  Filled 2017-11-09: qty 10

## 2017-11-09 MED ORDER — FENTANYL CITRATE (PF) 250 MCG/5ML IJ SOLN
INTRAMUSCULAR | Status: AC
  Filled 2017-11-09: qty 5

## 2017-11-09 MED ORDER — PROPOFOL 1000 MG/100ML IV EMUL
INTRAVENOUS | Status: AC
  Filled 2017-11-09: qty 100

## 2017-11-09 MED ORDER — ONDANSETRON HCL 4 MG/2ML IJ SOLN
INTRAMUSCULAR | Status: AC
  Filled 2017-11-09: qty 2

## 2017-11-09 MED ORDER — PAPAVERINE HCL 30 MG/ML IJ SOLN
INTRAMUSCULAR | Status: DC | PRN
  Administered 2017-11-09: 60 mg via INTRAVENOUS

## 2017-11-09 MED ORDER — FENTANYL CITRATE (PF) 100 MCG/2ML IJ SOLN
25.0000 ug | INTRAMUSCULAR | Status: DC | PRN
  Administered 2017-11-09 (×2): 25 ug via INTRAVENOUS

## 2017-11-09 MED ORDER — PROPOFOL 500 MG/50ML IV EMUL
INTRAVENOUS | Status: DC | PRN
  Administered 2017-11-09: 100 ug/kg/min via INTRAVENOUS
  Administered 2017-11-09: 09:00:00 via INTRAVENOUS

## 2017-11-09 MED ORDER — FENTANYL CITRATE (PF) 250 MCG/5ML IJ SOLN
INTRAMUSCULAR | Status: DC | PRN
  Administered 2017-11-09 (×2): 25 ug via INTRAVENOUS

## 2017-11-09 MED ORDER — LIDOCAINE-EPINEPHRINE (PF) 1 %-1:200000 IJ SOLN
INTRAMUSCULAR | Status: AC
  Filled 2017-11-09: qty 30

## 2017-11-09 SURGICAL SUPPLY — 39 items
ADH SKN CLS APL DERMABOND .7 (GAUZE/BANDAGES/DRESSINGS) ×1
ARMBAND PINK RESTRICT EXTREMIT (MISCELLANEOUS) ×4 IMPLANT
CANISTER SUCT 3000ML PPV (MISCELLANEOUS) ×2 IMPLANT
CANNULA VESSEL 3MM 2 BLNT TIP (CANNULA) ×2 IMPLANT
CLIP VESOCCLUDE MED 6/CT (CLIP) ×2 IMPLANT
CLIP VESOCCLUDE SM WIDE 6/CT (CLIP) ×2 IMPLANT
COVER PROBE W GEL 5X96 (DRAPES) IMPLANT
DECANTER SPIKE VIAL GLASS SM (MISCELLANEOUS) ×2 IMPLANT
DERMABOND ADVANCED (GAUZE/BANDAGES/DRESSINGS) ×1
DERMABOND ADVANCED .7 DNX12 (GAUZE/BANDAGES/DRESSINGS) ×1 IMPLANT
ELECT REM PT RETURN 9FT ADLT (ELECTROSURGICAL) ×2
ELECTRODE REM PT RTRN 9FT ADLT (ELECTROSURGICAL) ×1 IMPLANT
GAUZE SPONGE 4X4 16PLY XRAY LF (GAUZE/BANDAGES/DRESSINGS) ×1 IMPLANT
GLOVE BIO SURGEON STRL SZ 6.5 (GLOVE) ×3 IMPLANT
GLOVE BIO SURGEON STRL SZ7 (GLOVE) ×1 IMPLANT
GLOVE BIO SURGEON STRL SZ7.5 (GLOVE) ×2 IMPLANT
GLOVE BIOGEL PI IND STRL 6.5 (GLOVE) IMPLANT
GLOVE BIOGEL PI IND STRL 7.0 (GLOVE) IMPLANT
GLOVE BIOGEL PI IND STRL 8 (GLOVE) ×1 IMPLANT
GLOVE BIOGEL PI IND STRL 8.5 (GLOVE) IMPLANT
GLOVE BIOGEL PI INDICATOR 6.5 (GLOVE) ×2
GLOVE BIOGEL PI INDICATOR 7.0 (GLOVE) ×2
GLOVE BIOGEL PI INDICATOR 8 (GLOVE) ×1
GLOVE BIOGEL PI INDICATOR 8.5 (GLOVE) ×1
GOWN STRL REUS W/ TWL LRG LVL3 (GOWN DISPOSABLE) ×3 IMPLANT
GOWN STRL REUS W/TWL LRG LVL3 (GOWN DISPOSABLE) ×6
KIT BASIN OR (CUSTOM PROCEDURE TRAY) ×2 IMPLANT
KIT TURNOVER KIT B (KITS) ×2 IMPLANT
NS IRRIG 1000ML POUR BTL (IV SOLUTION) ×2 IMPLANT
PACK CV ACCESS (CUSTOM PROCEDURE TRAY) ×2 IMPLANT
PAD ARMBOARD 7.5X6 YLW CONV (MISCELLANEOUS) ×4 IMPLANT
SPONGE SURGIFOAM ABS GEL 100 (HEMOSTASIS) IMPLANT
SUT PROLENE 6 0 BV (SUTURE) ×2 IMPLANT
SUT VIC AB 3-0 SH 27 (SUTURE) ×2
SUT VIC AB 3-0 SH 27X BRD (SUTURE) ×1 IMPLANT
SUT VICRYL 4-0 PS2 18IN ABS (SUTURE) ×3 IMPLANT
TOWEL GREEN STERILE (TOWEL DISPOSABLE) ×2 IMPLANT
UNDERPAD 30X30 (UNDERPADS AND DIAPERS) ×2 IMPLANT
WATER STERILE IRR 1000ML POUR (IV SOLUTION) ×2 IMPLANT

## 2017-11-09 NOTE — Op Note (Signed)
    NAME: Reginald Carpenter    MRN: [id number] DOB: 01/02/1966    DATE OF OPERATION: 11/09/2017  PREOP DIAGNOSIS:    Stage V chronic kidney disease  POSTOP DIAGNOSIS:    Same  PROCEDURE:    Right brachiocephalic AV fistula  SURGEON: Di Kindle. Edilia Bo, MD, FACS  ASSIST: Dorcas Carrow, RNFA  ANESTHESIA: Local with sedation  EBL: Minimal  INDICATIONS:    [name] is a 52 y.o. male who is not yet on dialysis.  He presents for new access.   FINDINGS:   High bifurcation of the brachial artery.  The anastomosis was to the ulnar artery.  TECHNIQUE:   The patient was brought to the operating room and sedated by anesthesia.  The right upper extremity was prepped and draped in usual sterile fashion.  The vein was quite far lateral with respect to the artery and therefore elected to make separate incisions over the artery and vein.  After the skin was anesthetized with 1% lidocaine, a longitudinal incision was made over the artery just above the antecubital level.  Here the artery was dissected free and controlled with a vessel loop.  The artery was somewhat small but the patient had a known high bifurcation of the brachial artery.  I suspect that this was the ulnar branch.  A separate longitudinal incision was made over the cephalic vein in the lateral aspect of the upper arm.  In order to allow adequate length to mobilize over for anastomosis to the ulnar artery a separate longitudinal incision was made below the antecubital level where the vein was further mobilized.  Branches were divided between clips and 3-0 silk ties.  The patient was heparinized.  The vein was divided distally and gently distended with heparinized saline.  He was then brought through a tunnel for anastomosis to the ulnar artery.  The artery was clamped proximally distally and a longitudinal arteriotomy was made.  The vein was sewn into side to the artery using continuous 6-0 Prolene suture.  At the completion was a  good thrill in the fistula.  There was a good radial and ulnar signal with the Doppler at the completion.  Patient had significant adipose tissue overlying the fistula and I did try to dissected away some of the adipose tissue.  Hemostasis was obtained in the wounds and the heparin was partially reversed with protamine.  The wounds were closed the deep layer of 3-0 Vicryl and the skin closed with 4-0 Vicryl.  Dermabond was applied.  Patient tolerated the procedure well was transferred to the recovery room in stable condition.  All needle and sponge counts were correct.   Waverly Ferrari, MD, FACS [facility]  DATE OF DICTATION:   11/09/2017

## 2017-11-09 NOTE — Discharge Instructions (Signed)

## 2017-11-09 NOTE — Transfer of Care (Signed)
Immediate Anesthesia Transfer of Care Note  Patient: Reginald Carpenter  Procedure(s) Performed: Creation of Right Upper Arm Brachiocephalic ARTERIOVENOUS (AV) FISTULA (Right Arm Upper)  Patient Location: PACU  Anesthesia Type:MAC  Level of Consciousness: awake, alert , oriented and patient cooperative  Airway & Oxygen Therapy: Patient Spontanous Breathing and Patient connected to nasal cannula oxygen  Post-op Assessment: Report given to RN, Post -op Vital signs reviewed and stable and Patient moving all extremities  Post vital signs: Reviewed and stable  Last Vitals:  Vitals Value Taken Time  BP 162/71 11/09/2017  9:15 AM  Temp 36.5 C 11/09/2017  9:15 AM  Pulse 87 11/09/2017  9:18 AM  Resp 12 11/09/2017  9:18 AM  SpO2 93 % 11/09/2017  9:18 AM  Vitals shown include unvalidated device data.  Last Pain:  Vitals:   11/09/17 0915  TempSrc:   PainSc: 0-No pain      Patients Stated Pain Goal: 0 (97/67/34 1937)  Complications: No apparent anesthesia complications

## 2017-11-09 NOTE — Anesthesia Postprocedure Evaluation (Signed)
Anesthesia Post Note  Patient: Reginald Carpenter  Procedure(s) Performed: Creation of Right Upper Arm Brachiocephalic ARTERIOVENOUS (AV) FISTULA (Right Arm Upper)     Patient location during evaluation: PACU Anesthesia Type: MAC Level of consciousness: awake Pain management: pain level controlled Vital Signs Assessment: post-procedure vital signs reviewed and stable Respiratory status: spontaneous breathing Cardiovascular status: stable Postop Assessment: no apparent nausea or vomiting Anesthetic complications: no    Last Vitals:  Vitals:   11/09/17 0930 11/09/17 0945  BP: (!) 179/74   Pulse:  91  Resp:  15  Temp:    SpO2:  97%    Last Pain:  Vitals:   11/09/17 0954  TempSrc:   PainSc: 2                  Fausto Sampedro

## 2017-11-09 NOTE — Interval H&P Note (Signed)
History and Physical Interval Note:  11/09/2017 7:23 AM  [name]  has presented today for surgery, with the diagnosis of CHRONIC KIDNEY DISEASE FOR HEMODIALYSISI ACCESS  The various methods of treatment have been discussed with the patient and family. After consideration of risks, benefits and other options for treatment, the patient has consented to  Procedure(s): ARTERIOVENOUS (AV) FISTULA CREATION RIGHT UPPER ARM (Right) as a surgical intervention .  The patient's history has been reviewed, patient examined, no change in status, stable for surgery.  I have reviewed the patient's chart and labs.  Questions were answered to the patient's satisfaction.     Reginald Carpenter

## 2017-11-10 ENCOUNTER — Encounter (HOSPITAL_COMMUNITY): Payer: Self-pay | Admitting: Vascular Surgery

## 2017-11-10 ENCOUNTER — Telehealth: Payer: Self-pay | Admitting: Vascular Surgery

## 2017-11-10 ENCOUNTER — Ambulatory Visit (INDEPENDENT_AMBULATORY_CARE_PROVIDER_SITE_OTHER): Payer: BLUE CROSS/BLUE SHIELD | Admitting: Internal Medicine

## 2017-11-10 VITALS — BP 130/64 | HR 90 | Temp 98.1°F | Resp 16 | Ht 71.0 in | Wt 305.0 lb

## 2017-11-10 DIAGNOSIS — E785 Hyperlipidemia, unspecified: Secondary | ICD-10-CM

## 2017-11-10 DIAGNOSIS — E1122 Type 2 diabetes mellitus with diabetic chronic kidney disease: Secondary | ICD-10-CM

## 2017-11-10 LAB — POCT GLYCOSYLATED HEMOGLOBIN (HGB A1C): HEMOGLOBIN A1C: 6.8 % — AB (ref 4.0–5.6)

## 2017-11-10 NOTE — Patient Instructions (Addendum)
Please continue: - Basaglar 25 units at bedtime - Amaryl 2 mg before breakfast  Please return in 4 months with your sugar log.

## 2017-11-10 NOTE — Telephone Encounter (Signed)
sch appt spk to pt 12/22/17 2pm Dialysis Duplex 315pm p/o MD

## 2017-11-10 NOTE — Addendum Note (Signed)
Addended by: Wilford Corner on: 11/10/2017 10:32 AM   Modules accepted: Orders

## 2017-11-10 NOTE — Progress Notes (Signed)
Patient ID: Reginald Carpenter, male   DOB: 1965/07/14, 52 y.o.   MRN: [id number]   HPI: Reginald Carpenter is a 52 y.o.-year-old male, initially referred by his PCP, Jarrett Soho, PA, returning for f/u for DM2, dx in ~2000, prev. insulin-dependent since 2017, uncontrolled, with complications (CKD, PN - + Charcot foot L ankle). Last visit 4 months ago.  He is here with his wife who offers part of the history about his blood sugars and medical history since last visit.  He had Charcot foot sx in 05/2017 >> developed an infection -had to have surgery (bone graft).  He is using a knee scooter now, but today he is in a wheelchair.  He will start HD soon.  He had AV fistula placement yesterday.  Last hemoglobin A1c was: Lab Results  Component Value Date   HGBA1C 6.9 (H) 11/09/2017   HGBA1C 8.0 07/07/2017   HGBA1C 6.0 01/21/2017  10/22/2016: HbA1c 9.2%  Pt is on a regimen of: - Basaglar 25 units at bedtime - Amaryl 2 mg before breakfast-restarted 07/2017 Stopped Metformin 1000 mg 2x a day, with meals due to poor kidney function At last visit, she was on Amaryl 2 mg before dinner - started 10/2016, stopped 01/2014 due to lows He was on Basaglar 26 units at bedtime, but stopped 10/2016 after adding Amaryl  Pt checks his sugars 1-2 times a day: - am: 130-140 >> 53-103 >> 120-130, 146 >> 90-120s, 174 (no insulin) - 2h after b'fast: n/c  - before lunch: n/c >> 62-103 >> 150s >> 120-130 - 2h after lunch: n/c - before dinner: 65-114, 133 >> 150-160 >> 150-160 (snack) - 2h after dinner: n/c >> 126-143 >> n/c - bedtime: n/c >> 62-120 >> 150-160 >> n/c - nighttime: n/c Lowest sugar was 53 >> 123 >> 90; hypoglycemia awareness in the 60s Highest sugar was 220 >> 174  Glucometer: [employer]  Pt's meals are: - Breakfast: cereal; sausage and eggs - Lunch: ham and cheese sandwich, salads, pot pie - Dinner: salad, spaghetti, grilled chicken and steamed veggies - Snacks: cottage cheese He was drinking regular  sodas but stopped at the end of 2018. Occasional icecream.  -+ CKD-sees nephrology  - now has a fistula, last BUN/creatinine: 10/15/2017: 57/3.87, GFR 17 07/01/2017: ?/2.88, GFR 24, Protein/creatinie ratio: 13,978 05/14/2017: ?/2.42, GFR 30 10/22/2016: 44/1.84, GFR 39, glucose 182  No results found for: BUN, CREATININE  On lisinopril 20.  -+ HL; last set of lipids: 10/22/2016: 208/236/45/115 No results found for: CHOL, HDL, LDLCALC, LDLDIRECT, TRIG, CHOLHDL  On Lipitor 40.  - last eye exam was in 2017:?  DR  -+ Numbness and tingling in his feet.  Pt has FH of DM in mother, sister (GDM).  He also has HTN.  ROS: Constitutional: no weight gain/no weight loss, no fatigue, no subjective hyperthermia, no subjective hypothermia Eyes: no blurry vision, no xerophthalmia ENT: no sore throat, no nodules palpated in throat, no dysphagia, no odynophagia, no hoarseness Cardiovascular: no CP/no SOB/no palpitations/no leg swelling Respiratory: no cough/no SOB/no wheezing Gastrointestinal: no N/no V/no D/no C/no acid reflux Musculoskeletal: no muscle aches/no joint aches Skin: no rashes, no hair loss Neurological: no tremors/no numbness/no tingling/no dizziness  I reviewed pt's medications, allergies, PMH, social hx, family hx, and changes were documented in the history of present illness. Otherwise, unchanged from my initial visit note.  Past Medical History:  Diagnosis Date  . 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 Chronic kidney disease   .  Diabetes mellitus without complication (HCC)    type 2  . High cholesterol   . Hypertension   . Neuropathy    Past Surgical History:  Procedure Laterality Date  . ANKLE SURGERY     multiple ankle surgeries, with hardware and hardware removal at [facility]  . AV FISTULA PLACEMENT Right 11/09/2017   Procedure: Creation of Right Upper Arm Brachiocephalic ARTERIOVENOUS (AV) FISTULA;  Surgeon: Chuck Hint, MD;   Location: [facility] OR;  Service: Vascular;  Laterality: Right;  . CARPAL TUNNEL RELEASE Bilateral   . COLONOSCOPY     Social History   Social History  . Marital status: Married    Spouse name: N/A  . Number of children: 2   Occupational History  . Out of work   Social History Main Topics  . Smoking status: Former Smoker - quit 2004    Years: 10.00  . Smokeless tobacco: Never Used  . Alcohol use No  . Drug use: No   Current Outpatient Medications on File Prior to Visit  Medication Sig Dispense Refill  . amLODipine (NORVASC) 10 MG tablet Take 10 mg by mouth every evening.     Marland Kitchen atorvastatin (LIPITOR) 80 MG tablet Take 80 mg by mouth every morning.    . calcium acetate (PHOSLO) 667 MG capsule Take 667 mg by mouth 3 (three) times daily with meals.     . furosemide (LASIX) 20 MG tablet Take 20 mg by mouth 2 (two) times daily.  4  . glimepiride (AMARYL) 2 MG tablet 1 TABLET WITH BREAKFAST OR THE FIRST MAIN MEAL OF THE DAY ONCE A DAY ORALLY 90 (Patient taking differently: Take 2 mg by mouth daily with breakfast. ) 90 tablet 0  . Insulin Glargine (BASAGLAR KWIKPEN) 100 UNIT/ML SOPN Inject 0.25 mLs (25 Units total) into the skin at bedtime. 25 mL 1  . Insulin Pen Needle (PEN NEEDLES) 32G X 4 MM MISC 1 each by Does not apply route daily. With Basaglar pen 100 each 0  . lisinopril (PRINIVIL,ZESTRIL) 20 MG tablet Take 20 mg by mouth 2 (two) times daily.   3  . oxyCODONE (ROXICODONE) 5 MG immediate release tablet Take 1 tablet (5 mg total) by mouth every 4 (four) hours as needed for severe pain. 15 tablet 0  . sodium bicarbonate 650 MG tablet Take 650 mg by mouth 3 (three) times daily.    . Vitamin D, Ergocalciferol, (DRISDOL) 50000 units CAPS capsule Take 50,000 Units by mouth every Monday.   0  . DULoxetine (CYMBALTA) 30 MG capsule Take 30 mg by mouth every evening.      No current facility-administered medications on file prior to visit.    No Known Allergies Family History  Problem Relation  Age of Onset  . Heart failure Mother    Also; HTN, HL, heart ds, lung CA in Mother  PE: BP 130/64 (BP Location: Left Arm, Patient Position: Sitting, Cuff Size: Large)   Pulse 90   Temp 98.1 F (36.7 C) (Oral)   Resp 16   Ht 5\' 11"  (1.803 m)   Wt (!) 305 lb (138.3 kg)   SpO2 97%   BMI 42.54 kg/m He could not be weighed (wheelchair) Wt Readings from Last 3 Encounters:  11/10/17 (!) 305 lb (138.3 kg)  11/02/17 (!) 308 lb (139.7 kg)  01/21/17 (!) 306 lb (138.8 kg)   Constitutional: Obese, in NAD, in wheelchair today Eyes: PERRLA, EOMI, no exophthalmos ENT: moist mucous membranes, no thyromegaly, no cervical lymphadenopathy  Cardiovascular: RRR, No MRG Respiratory: CTA B Gastrointestinal: abdomen soft, NT, ND, BS+ Musculoskeletal: + deformities: Has L charcot ankle joint- now leg in cast, strength intact in all 4 Skin: moist, warm, no rashes; healing incision in right upper arm Neurological: no tremor with outstretched hands, DTR normal in all 4  ASSESSMENT: 1. DM2, insulin-dependent, uncontrolled, with complications - PN - CKD with proteinuria  - sees nephrology. 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  2. HL  3.  Obesity class III  PLAN:  1. Patient with long-standing, uncontrolled diabetes, on basal insulin and oral antidiabetic regimen with Amaryl added back at last visit.  He was able to come off Amaryl before after he improved his diet towards a more plant-based one without concentrated sweets.  However, at last visit, he was after surgery and in a wheelchair and sugars were worse.  We added back a low dose Amaryl before breakfast.  He was off metformin due to poor kidney function.  We also discussed about the possibility of switching to a GLP-1 receptor agonist instead of Amaryl in the near future. -Reviewed most recent HbA1c from yesterday  and this was excellent, at 6.9%.  Unfortunately, the results returned after we checked his point-of-care HbA1c today, which returned at 6.8%. - At this visit, sugars have better as reflected in the HbA1c, also.  His highest sugars are before dinner, and I believe that this is due to fruit or other snacks in the p.m.  We discussed about eating sweeter foods at the end of the meal, as dessert, rather than in the middle of the afternoon, to reduce the hyperglycemic spikes.  Otherwise, no changes are needed for now in his medication regimen - I suggested to:  Patient Instructions  Please continue: - Basaglar 25 units at bedtime - Amaryl 2 mg before breakfast  Please return in 3-4 months with your sugar log.   - continue checking sugars at different times of the day - check 1x a day, rotating checks - advised for yearly eye exams >> he is UTD - Return to clinic in 4 mo with sugar log    2. HL - Reviewed latest lipid panel from 10/2016: LDL above goal - Continues atorvastatin without side effects. -He is due for another lipid panel -he states his PCP at the end of the month and will have a lipid panel done  3.  Obesity class III -Weight not significantly changed since last visit -I am hoping that his weight will decrease after he starts being more mobile  Carlus Pavlov, MD PhD [facility]

## 2017-11-12 ENCOUNTER — Other Ambulatory Visit: Payer: Self-pay

## 2017-11-12 DIAGNOSIS — N185 Chronic kidney disease, stage 5: Secondary | ICD-10-CM

## 2017-11-24 DIAGNOSIS — M14672 Charcot's joint, left ankle and foot: Secondary | ICD-10-CM | POA: Diagnosis not present

## 2017-11-24 DIAGNOSIS — I10 Essential (primary) hypertension: Secondary | ICD-10-CM | POA: Diagnosis not present

## 2017-12-02 DIAGNOSIS — E1140 Type 2 diabetes mellitus with diabetic neuropathy, unspecified: Secondary | ICD-10-CM | POA: Diagnosis not present

## 2017-12-02 DIAGNOSIS — R1011 Right upper quadrant pain: Secondary | ICD-10-CM | POA: Diagnosis not present

## 2017-12-02 DIAGNOSIS — Z23 Encounter for immunization: Secondary | ICD-10-CM | POA: Diagnosis not present

## 2017-12-02 DIAGNOSIS — N183 Chronic kidney disease, stage 3 (moderate): Secondary | ICD-10-CM | POA: Diagnosis not present

## 2017-12-02 DIAGNOSIS — I10 Essential (primary) hypertension: Secondary | ICD-10-CM | POA: Diagnosis not present

## 2017-12-06 ENCOUNTER — Other Ambulatory Visit: Payer: Self-pay | Admitting: Family Medicine

## 2017-12-06 DIAGNOSIS — R1011 Right upper quadrant pain: Secondary | ICD-10-CM

## 2017-12-08 DIAGNOSIS — T849XXA Unspecified complication of internal orthopedic prosthetic device, implant and graft, initial encounter: Secondary | ICD-10-CM | POA: Diagnosis not present

## 2017-12-14 ENCOUNTER — Other Ambulatory Visit: Payer: Self-pay | Admitting: Internal Medicine

## 2017-12-16 ENCOUNTER — Ambulatory Visit
Admission: RE | Admit: 2017-12-16 | Discharge: 2017-12-16 | Disposition: A | Discharge status: Home / Self Care | Payer: BLUE CROSS/BLUE SHIELD | Source: Ambulatory Visit | Attending: Family Medicine | Admitting: Family Medicine

## 2017-12-16 DIAGNOSIS — R1011 Right upper quadrant pain: Secondary | ICD-10-CM

## 2017-12-16 DIAGNOSIS — K7689 Other specified diseases of liver: Secondary | ICD-10-CM | POA: Diagnosis not present

## 2017-12-17 DIAGNOSIS — D509 Iron deficiency anemia, unspecified: Secondary | ICD-10-CM | POA: Diagnosis not present

## 2017-12-17 DIAGNOSIS — Z79899 Other long term (current) drug therapy: Secondary | ICD-10-CM | POA: Diagnosis not present

## 2017-12-17 DIAGNOSIS — I10 Essential (primary) hypertension: Secondary | ICD-10-CM | POA: Diagnosis not present

## 2017-12-17 DIAGNOSIS — E559 Vitamin D deficiency, unspecified: Secondary | ICD-10-CM | POA: Diagnosis not present

## 2017-12-17 DIAGNOSIS — R809 Proteinuria, unspecified: Secondary | ICD-10-CM | POA: Diagnosis not present

## 2017-12-17 DIAGNOSIS — M14672 Charcot's joint, left ankle and foot: Secondary | ICD-10-CM | POA: Diagnosis not present

## 2017-12-17 DIAGNOSIS — E782 Mixed hyperlipidemia: Secondary | ICD-10-CM | POA: Diagnosis not present

## 2017-12-17 DIAGNOSIS — N183 Chronic kidney disease, stage 3 (moderate): Secondary | ICD-10-CM | POA: Diagnosis not present

## 2017-12-18 ENCOUNTER — Other Ambulatory Visit: Payer: Self-pay | Admitting: Internal Medicine

## 2017-12-22 ENCOUNTER — Encounter: Payer: Self-pay | Admitting: Vascular Surgery

## 2017-12-22 ENCOUNTER — Ambulatory Visit (INDEPENDENT_AMBULATORY_CARE_PROVIDER_SITE_OTHER): Payer: BLUE CROSS/BLUE SHIELD | Admitting: Vascular Surgery

## 2017-12-22 ENCOUNTER — Ambulatory Visit (HOSPITAL_COMMUNITY)
Admission: RE | Admit: 2017-12-22 | Discharge: 2017-12-22 | Disposition: A | Discharge status: Home / Self Care | Payer: BLUE CROSS/BLUE SHIELD | Source: Ambulatory Visit | Attending: Vascular Surgery | Admitting: Vascular Surgery

## 2017-12-22 VITALS — BP 170/85 | HR 89 | Temp 97.8°F | Resp 16 | Ht 71.0 in | Wt 313.0 lb

## 2017-12-22 DIAGNOSIS — N185 Chronic kidney disease, stage 5: Secondary | ICD-10-CM | POA: Diagnosis not present

## 2017-12-22 DIAGNOSIS — E559 Vitamin D deficiency, unspecified: Secondary | ICD-10-CM | POA: Diagnosis not present

## 2017-12-22 DIAGNOSIS — R809 Proteinuria, unspecified: Secondary | ICD-10-CM | POA: Diagnosis not present

## 2017-12-22 DIAGNOSIS — N184 Chronic kidney disease, stage 4 (severe): Secondary | ICD-10-CM | POA: Diagnosis not present

## 2017-12-22 DIAGNOSIS — E872 Acidosis: Secondary | ICD-10-CM | POA: Diagnosis not present

## 2017-12-22 NOTE — Progress Notes (Signed)
Patient name: Reginald Carpenter MRN: [id number] DOB: November 12, 1965 Sex: male  REASON FOR VISIT:   Follow-up after right ulnar-cephalic fistula.  HPI:   Reginald Carpenter is a pleasant 52 y.o. male who is not yet on dialysis.  The patient had a right brachiocephalic fistula on 11/09/2017.  Of note the patient had a high bifurcation of the brachial artery and the anastomosis was to the ulnar artery.  Patient denies any pain or paresthesias in his right upper extremity.  He notes that the wounds have been slow to heal.  He he states that he always has problems healing wounds because of his diabetes.  He denies fever or chills.  Current Outpatient Medications  Medication Sig Dispense Refill  . amLODipine (NORVASC) 10 MG tablet Take 10 mg by mouth every evening.     Marland Kitchen atorvastatin (LIPITOR) 80 MG tablet Take 80 mg by mouth every morning.    . BD PEN NEEDLE NANO U/F 32G X 4 MM MISC 1 EACH BY DOES NOT APPLY ROUTE DAILY. WITH BASAGLAR PEN 100 each 0  . calcium acetate (PHOSLO) 667 MG capsule Take 667 mg by mouth 3 (three) times daily with meals.     . DULoxetine (CYMBALTA) 30 MG capsule Take 30 mg by mouth every evening.     . furosemide (LASIX) 20 MG tablet Take 20 mg by mouth 2 (two) times daily.  4  . glimepiride (AMARYL) 2 MG tablet 1 TABLET WITH BREAKFAST OR THE FIRST MAIN MEAL OF THE DAY ONCE A DAY ORALLY 90 (Patient taking differently: Take 2 mg by mouth daily with breakfast. ) 90 tablet 0  . Insulin Glargine (BASAGLAR KWIKPEN) 100 UNIT/ML SOPN INJECT 25 UNITS INTO THE SKIN AT BEDTIME. 30 pen 1  . lisinopril (PRINIVIL,ZESTRIL) 40 MG tablet Take 40 mg by mouth 2 (two) times daily.   3  . sodium bicarbonate 650 MG tablet Take 650 mg by mouth 3 (three) times daily.    . Vitamin D, Ergocalciferol, (DRISDOL) 50000 units CAPS capsule Take 50,000 Units by mouth every Monday.   0   No current facility-administered medications for this visit.     REVIEW OF SYSTEMS:  [X]  denotes positive finding, [ ]  denotes  negative finding Vascular    Leg swelling x   Cardiac    Chest pain or chest pressure:    Shortness of breath upon exertion: x   Short of breath when lying flat:    Irregular heart rhythm:    Constitutional    Fever or chills:     PHYSICAL EXAM:   Vitals:   12/22/17 1426  BP: (!) 170/85  Pulse: 89  Resp: 16  Temp: 97.8 F (36.6 C)  TempSrc: Oral  SpO2: 97%  Weight: (!) 313 lb (142 kg)  Height: 5\' 11"  (1.803 m)    GENERAL: The patient is a well-nourished male, in no acute distress. The vital signs are documented above. CARDIOVASCULAR: There is a regular rate and rhythm. PULMONARY: There is good air exchange bilaterally without wheezing or rales. His right upper arm fistula has an excellent thrill. The vein was lateral and therefore had to make a separate incision over the vein to get adequate length to reach the ulnar artery.  The incision laterally has some slight separation and I have instructed him to keep bacitracin and a Band-Aid on this and to keep this clean and dry.  I think the vein itself is slightly medial to this wound.  DATA:   DUPLEX AV  FISTULA: I have independently interpreted the duplex of the AV fistula.  The diameters of the fistula ranged from 0.6-0.78 cm.  The fistula is widely patent without any areas of significant stenosis noted.  MEDICAL ISSUES:   STATUS POST RIGHT UPPER ARM FISTULA: Patient is doing well status post right upper arm AV fistula.  The vein is maturing adequately.  I have instructed him to keep a close eye in the wound on his right lateral arm as the fistula is not too far from that.  He lives an hour and a half away so he will return only if the wound is not improving.  I will see him back as needed.  Waverly Ferrari [facility] [phone]

## 2018-01-08 DIAGNOSIS — T849XXA Unspecified complication of internal orthopedic prosthetic device, implant and graft, initial encounter: Secondary | ICD-10-CM | POA: Diagnosis not present

## 2018-01-08 DIAGNOSIS — R05 Cough: Secondary | ICD-10-CM | POA: Diagnosis not present

## 2018-01-08 DIAGNOSIS — L03311 Cellulitis of abdominal wall: Secondary | ICD-10-CM | POA: Diagnosis not present

## 2018-01-08 DIAGNOSIS — Z6841 Body Mass Index (BMI) 40.0 and over, adult: Secondary | ICD-10-CM | POA: Diagnosis not present

## 2018-01-10 DIAGNOSIS — I10 Essential (primary) hypertension: Secondary | ICD-10-CM | POA: Diagnosis not present

## 2018-01-10 DIAGNOSIS — M14672 Charcot's joint, left ankle and foot: Secondary | ICD-10-CM | POA: Diagnosis not present

## 2018-01-24 DIAGNOSIS — I10 Essential (primary) hypertension: Secondary | ICD-10-CM | POA: Diagnosis not present

## 2018-01-24 DIAGNOSIS — Z9889 Other specified postprocedural states: Secondary | ICD-10-CM | POA: Diagnosis not present

## 2018-01-24 DIAGNOSIS — E1142 Type 2 diabetes mellitus with diabetic polyneuropathy: Secondary | ICD-10-CM | POA: Diagnosis not present

## 2018-01-24 DIAGNOSIS — G894 Chronic pain syndrome: Secondary | ICD-10-CM | POA: Diagnosis not present

## 2018-01-24 DIAGNOSIS — M14672 Charcot's joint, left ankle and foot: Secondary | ICD-10-CM | POA: Diagnosis not present

## 2018-01-24 DIAGNOSIS — G8921 Chronic pain due to trauma: Secondary | ICD-10-CM | POA: Diagnosis not present

## 2018-02-01 DIAGNOSIS — I10 Essential (primary) hypertension: Secondary | ICD-10-CM | POA: Diagnosis not present

## 2018-02-01 DIAGNOSIS — M14672 Charcot's joint, left ankle and foot: Secondary | ICD-10-CM | POA: Diagnosis not present

## 2018-02-02 DIAGNOSIS — I10 Essential (primary) hypertension: Secondary | ICD-10-CM | POA: Diagnosis not present

## 2018-02-02 DIAGNOSIS — R809 Proteinuria, unspecified: Secondary | ICD-10-CM | POA: Diagnosis not present

## 2018-02-02 DIAGNOSIS — Z79899 Other long term (current) drug therapy: Secondary | ICD-10-CM | POA: Diagnosis not present

## 2018-02-02 DIAGNOSIS — E559 Vitamin D deficiency, unspecified: Secondary | ICD-10-CM | POA: Diagnosis not present

## 2018-02-02 DIAGNOSIS — D509 Iron deficiency anemia, unspecified: Secondary | ICD-10-CM | POA: Diagnosis not present

## 2018-02-02 DIAGNOSIS — N183 Chronic kidney disease, stage 3 (moderate): Secondary | ICD-10-CM | POA: Diagnosis not present

## 2018-02-03 ENCOUNTER — Other Ambulatory Visit: Payer: Self-pay | Admitting: Internal Medicine

## 2018-02-07 DIAGNOSIS — T849XXA Unspecified complication of internal orthopedic prosthetic device, implant and graft, initial encounter: Secondary | ICD-10-CM | POA: Diagnosis not present

## 2018-02-09 DIAGNOSIS — I10 Essential (primary) hypertension: Secondary | ICD-10-CM | POA: Diagnosis not present

## 2018-02-09 DIAGNOSIS — N185 Chronic kidney disease, stage 5: Secondary | ICD-10-CM | POA: Diagnosis not present

## 2018-02-09 DIAGNOSIS — E1129 Type 2 diabetes mellitus with other diabetic kidney complication: Secondary | ICD-10-CM | POA: Diagnosis not present

## 2018-02-09 DIAGNOSIS — R809 Proteinuria, unspecified: Secondary | ICD-10-CM | POA: Diagnosis not present

## 2018-02-15 DIAGNOSIS — I10 Essential (primary) hypertension: Secondary | ICD-10-CM | POA: Diagnosis not present

## 2018-02-15 DIAGNOSIS — M14672 Charcot's joint, left ankle and foot: Secondary | ICD-10-CM | POA: Diagnosis not present

## 2018-02-23 DIAGNOSIS — I10 Essential (primary) hypertension: Secondary | ICD-10-CM | POA: Diagnosis not present

## 2018-02-23 DIAGNOSIS — M7989 Other specified soft tissue disorders: Secondary | ICD-10-CM | POA: Diagnosis not present

## 2018-02-23 DIAGNOSIS — M14672 Charcot's joint, left ankle and foot: Secondary | ICD-10-CM | POA: Diagnosis not present

## 2018-02-23 DIAGNOSIS — M79606 Pain in leg, unspecified: Secondary | ICD-10-CM | POA: Diagnosis not present

## 2018-02-23 DIAGNOSIS — L03116 Cellulitis of left lower limb: Secondary | ICD-10-CM | POA: Diagnosis not present

## 2018-02-24 DIAGNOSIS — N183 Chronic kidney disease, stage 3 (moderate): Secondary | ICD-10-CM | POA: Diagnosis not present

## 2018-02-24 DIAGNOSIS — E559 Vitamin D deficiency, unspecified: Secondary | ICD-10-CM | POA: Diagnosis not present

## 2018-02-24 DIAGNOSIS — I10 Essential (primary) hypertension: Secondary | ICD-10-CM | POA: Diagnosis not present

## 2018-02-24 DIAGNOSIS — R809 Proteinuria, unspecified: Secondary | ICD-10-CM | POA: Diagnosis not present

## 2018-02-24 DIAGNOSIS — Z79899 Other long term (current) drug therapy: Secondary | ICD-10-CM | POA: Diagnosis not present

## 2018-02-24 DIAGNOSIS — D509 Iron deficiency anemia, unspecified: Secondary | ICD-10-CM | POA: Diagnosis not present

## 2018-02-28 DIAGNOSIS — E872 Acidosis: Secondary | ICD-10-CM | POA: Diagnosis not present

## 2018-02-28 DIAGNOSIS — N185 Chronic kidney disease, stage 5: Secondary | ICD-10-CM | POA: Diagnosis not present

## 2018-02-28 DIAGNOSIS — E875 Hyperkalemia: Secondary | ICD-10-CM | POA: Diagnosis not present

## 2018-03-07 DIAGNOSIS — M14672 Charcot's joint, left ankle and foot: Secondary | ICD-10-CM | POA: Diagnosis not present

## 2018-03-07 DIAGNOSIS — I10 Essential (primary) hypertension: Secondary | ICD-10-CM | POA: Diagnosis not present

## 2018-03-10 DIAGNOSIS — N186 End stage renal disease: Secondary | ICD-10-CM | POA: Diagnosis not present

## 2018-03-11 DIAGNOSIS — M14672 Charcot's joint, left ankle and foot: Secondary | ICD-10-CM | POA: Diagnosis not present

## 2018-03-12 ENCOUNTER — Other Ambulatory Visit: Payer: Self-pay | Admitting: Internal Medicine

## 2018-03-14 ENCOUNTER — Ambulatory Visit (INDEPENDENT_AMBULATORY_CARE_PROVIDER_SITE_OTHER): Payer: BLUE CROSS/BLUE SHIELD | Admitting: Internal Medicine

## 2018-03-14 ENCOUNTER — Encounter: Payer: Self-pay | Admitting: Internal Medicine

## 2018-03-14 VITALS — BP 136/80 | HR 88 | Ht 71.0 in | Wt 326.0 lb

## 2018-03-14 DIAGNOSIS — E785 Hyperlipidemia, unspecified: Secondary | ICD-10-CM

## 2018-03-14 DIAGNOSIS — E1122 Type 2 diabetes mellitus with diabetic chronic kidney disease: Secondary | ICD-10-CM | POA: Diagnosis not present

## 2018-03-14 LAB — POCT GLYCOSYLATED HEMOGLOBIN (HGB A1C): Hemoglobin A1C: 7.8 % — AB (ref 4.0–5.6)

## 2018-03-14 MED ORDER — SEMAGLUTIDE(0.25 OR 0.5MG/DOS) 2 MG/1.5ML ~~LOC~~ SOPN: 0.5000 mg | PEN_INJECTOR | SUBCUTANEOUS | 5 refills | Status: DC

## 2018-03-14 NOTE — Progress Notes (Addendum)
Patient ID: Reginald Carpenter, male   DOB: [birth year], 53 y.o.   MRN: [id number]   HPI: Reginald Carpenter is a 53 y.o.-year-old male, initially referred by his PCP, Jarrett Soho, PA, returning for f/u for DM2, dx in ~2000, prev. insulin-dependent since 2017, uncontrolled, with complications (CKD, PN - + Charcot foot L ankle). Last visit 4 months ago.   He had surgery for AV fistula placement right before last visit in preparation for hemodialysis. He will start Peritoneal dialysis next week.  Sugars are higher during the holidays.  He also gained weight.  Last hemoglobin A1c was: Lab Results  Component Value Date   HGBA1C 6.8 (A) 11/10/2017   HGBA1C 6.9 (H) 11/09/2017   HGBA1C 8.0 07/07/2017  10/22/2016: HbA1c 9.2%  Pt is on a regimen of: - Basaglar 25 units at bedtime - Amaryl 2 mg before breakfast- restarted 07/2017. Stopped Metformin 1000 mg 2x a day, with meals due to poor kidney function He was previously on Amaryl 2 mg before dinner - started 10/2016, stopped 01/2014 due to lows  Pt checks his sugars 1-2 times a day: - am: 120-130, 146 >> 90-120s, 174 (no insulin) >> 95-140s - 2h after b'fast: n/c  - before lunch:  62-103 >> 150s >> 120-130 >> 120s - 2h after lunch: n/c - before dinner:150-160 >> 150-160 (snack) >> snack: 160-210 - 2h after dinner: n/c >> 126-143 >> n/c - bedtime: n/c >> 62-120 >> 150-160 >> n/c >> 150-180 - nighttime: n/c Lowest sugar was 53 >> 123 >> 90 >> 90s; hypoglycemia awareness in the 60s. Highest sugar was 220 >> 174 >> 210.  Glucometer: [employer]  Pt's meals are: - Breakfast: cereal; sausage and eggs - Lunch: ham and cheese sandwich, salads, pot pie - Dinner: salad, spaghetti, grilled chicken and steamed veggies - Snacks: cottage cheese He was drinking regular sodas but stopped at the end of 2018. He eats cookies - oreos + milk.  -+ CKD-sees nephrology, now has a fistula,last BUN/creatinine: 10/15/2017: 57/3.87, GFR 17 07/01/2017: ?/2.88, GFR 24,  Protein/creatinie ratio: 13,978 05/14/2017: ?/2.42, GFR 30 10/22/2016: 44/1.84, GFR 39, glucose 182  No results found for: BUN, CREATININE  On lisinopril 20.  -+ HL; last set of lipids: 10/22/2016: 208/236/45/115 No results found for: CHOL, HDL, LDLCALC, LDLDIRECT, TRIG, CHOLHDL  On Lipitor 40.  - last eye exam was in 2017:?  DR  -+ Numbness and tingling in his feet. He had Charcot foot surgery in 05/2017 >> he developed an infection and had to have surgery (bone graft).   Pt has FH of DM in mother, sister (GDM).  He also has HTN.  He got his disability.  ROS: Constitutional: + weight gain/no weight loss, + fatigue, no subjective hyperthermia, no subjective hypothermia Eyes: no blurry vision, no xerophthalmia ENT: no sore throat, no nodules palpated in neck, no dysphagia, no odynophagia, no hoarseness Cardiovascular: no CP/+ SOB/no palpitations/+ leg swelling Respiratory: no cough/+ SOB/no wheezing Gastrointestinal: no N/no V/no D/no C/no acid reflux Musculoskeletal: no muscle aches/+ joint aches Skin: no rashes, no hair loss Neurological: no tremors/no numbness/no tingling/no dizziness  I reviewed pt's medications, allergies, PMH, social hx, family hx, and changes were documented in the history of present illness. Otherwise, unchanged from my initial visit note.  Past Medical History:  Diagnosis Date  . 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 Chronic kidney disease   . Diabetes mellitus without complication (HCC)    type 2  . High  cholesterol   . Hypertension   . Neuropathy    Past Surgical History:  Procedure Laterality Date  . ANKLE SURGERY     multiple ankle surgeries, with hardware and hardware removal at [facility]  . AV FISTULA PLACEMENT Right 11/09/2017   Procedure: Creation of Right Upper Arm Brachiocephalic ARTERIOVENOUS (AV) FISTULA;  Surgeon: Chuck Hint, MD;  Location: [facility] OR;  Service: Vascular;  Laterality: Right;  . CARPAL  TUNNEL RELEASE Bilateral   . COLONOSCOPY     Social History   Social History  . Marital status: Married    Spouse name: N/A  . Number of children: 2   Occupational History  . Out of work   Social History Main Topics  . Smoking status: Former Smoker - quit 2004    Years: 10.00  . Smokeless tobacco: Never Used  . Alcohol use No  . Drug use: No   Current Outpatient Medications on File Prior to Visit  Medication Sig Dispense Refill  . amLODipine (NORVASC) 10 MG tablet Take 10 mg by mouth every evening.     Marland Kitchen atorvastatin (LIPITOR) 80 MG tablet Take 80 mg by mouth every morning.    . BD PEN NEEDLE NANO U/F 32G X 4 MM MISC 1 EACH BY DOES NOT APPLY ROUTE DAILY. WITH BASAGLAR PEN 100 each 0  . calcium acetate (PHOSLO) 667 MG capsule Take 667 mg by mouth 3 (three) times daily with meals.     . DULoxetine (CYMBALTA) 30 MG capsule Take 30 mg by mouth every evening.     . furosemide (LASIX) 20 MG tablet Take 20 mg by mouth 2 (two) times daily.  4  . glimepiride (AMARYL) 2 MG tablet 1 TABLET WITH BREAKFAST OR THE FIRST MAIN MEAL OF THE DAY ONCE A DAY ORALLY 90 (Patient taking differently: Take 2 mg by mouth daily with breakfast. ) 90 tablet 0  . Insulin Glargine (BASAGLAR KWIKPEN) 100 UNIT/ML SOPN INJECT 25 UNITS INTO THE SKIN AT BEDTIME. 45 mL 2  . lisinopril (PRINIVIL,ZESTRIL) 40 MG tablet Take 40 mg by mouth 2 (two) times daily.   3  . sodium bicarbonate 650 MG tablet Take 650 mg by mouth 3 (three) times daily.    . Vitamin D, Ergocalciferol, (DRISDOL) 50000 units CAPS capsule Take 50,000 Units by mouth every Monday.   0   No current facility-administered medications on file prior to visit.    No Known Allergies Family History  Problem Relation Age of Onset  . Heart failure Mother    Also; HTN, HL, heart ds, lung CA in Mother  PE: BP 136/80   Pulse 88   Ht 5\' 11"  (1.803 m)   Wt (!) 326 lb (147.9 kg)   SpO2 97%   BMI 45.47 kg/m  Wt Readings from Last 3 Encounters:  03/14/18  (!) 326 lb (147.9 kg)  12/22/17 (!) 313 lb (142 kg)  11/10/17 (!) 305 lb (138.3 kg)   Constitutional: obese, in NAD, uses a scooter Eyes: PERRLA, EOMI, no exophthalmos ENT: moist mucous membranes, no thyromegaly, no cervical lymphadenopathy Cardiovascular: RRR, No MRG Respiratory: CTA B Gastrointestinal: abdomen soft, NT, ND, BS+ Musculoskeletal: + deformities:L charcot ankle joint, L foot in cast, strength intact in all 4 Skin: moist, warm, no rashes Neurological: no tremor with outstretched hands, DTR normal in all 4   ASSESSMENT: 1. DM2, insulin-dependent, uncontrolled, with complications - PN - CKD with proteinuria  - sees nephrology. 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  2. HL  3.  Obesity class III  PLAN:  1. Patient with longstanding, uncontrolled diabetes, on basal insulin and sulfonylurea.  He was able to come off Amaryl before after he improved his diet towards a more plant-based one, without concentrated sweets.  We had to add back Amaryl as his sugars increased and he was taken off metformin due to poor kidney function after his surgery.  We did discuss about the possibility of switching to a GLP-1 receptor agonist instead of Amaryl in the future.  However, at last visit, HbA1c was 6.8%, which was at goal so we did not change his regimen.  His highest sugars at that time were before dinner, probably due to fruit or other snacks in the p.m.  We discussed about eating sweeter foods at the end of the meal, as desired, rather than in the middle of the afternoon, to reduce hyperglycemic spikes. -At this visit, sugars are higher after the holidays.  We discussed about stopping Amaryl and starting a GLP-1 receptor agonist.  He agrees to start Ozempic.  We discussed about benefits, mechanism of action, and possible side effects.  Given coupon card and  also brochure about the medication.  We will start at a low dose and advance as tolerated. -We discussed that his sugars will probably improve after starting dialysis.  At next visit or may be even sooner, we may be able to also reduce Basaglar insulin dose. - I suggested to:  Patient Instructions  Please continue: - Basaglar 25 units daily at bedtime  Stop Amaryl.  Please start Ozempic 0.25 mg weekly in a.m. (for example on Sunday morning) x 4 weeks, then increase to 0.5 mg weekly in a.m. if no nausea or hypoglycemia.  Please return in 3-4 months with your sugar log.   - today, HbA1c is 7.8% (higher) - continue checking sugars at different times of the day - check 1x a day, rotating checks - advised for yearly eye exams >> he is not UTD - Return to clinic in 3-4 mo with sugar log     2. HL - Reviewed latest lipid panel from 10/2016: LDL above goal - Continues atorvastatin without side effects. - he had a new Lipid panel 1 mo ago >> need records  3.  Obesity class III -His weight increase since last visit -will start Ozempic and stop Amaryl >> these should help  Carlus Pavlovristina Asmi Fugere, MD PhD [facility]

## 2018-03-14 NOTE — Patient Instructions (Signed)
Please continue: - Basaglar 25 units daily at bedtime  Stop Amaryl.  Please start Ozempic 0.25 mg weekly in a.m. (for example on Sunday morning) x 4 weeks, then increase to 0.5 mg weekly in a.m. if no nausea or hypoglycemia.  Please return in 3-4 months with your sugar log.

## 2018-03-15 DIAGNOSIS — M14672 Charcot's joint, left ankle and foot: Secondary | ICD-10-CM | POA: Diagnosis not present

## 2018-03-17 DIAGNOSIS — N186 End stage renal disease: Secondary | ICD-10-CM | POA: Diagnosis not present

## 2018-03-17 DIAGNOSIS — Z23 Encounter for immunization: Secondary | ICD-10-CM | POA: Diagnosis not present

## 2018-03-17 DIAGNOSIS — Z992 Dependence on renal dialysis: Secondary | ICD-10-CM | POA: Diagnosis not present

## 2018-03-19 DIAGNOSIS — Z23 Encounter for immunization: Secondary | ICD-10-CM | POA: Diagnosis not present

## 2018-03-19 DIAGNOSIS — N186 End stage renal disease: Secondary | ICD-10-CM | POA: Diagnosis not present

## 2018-03-19 DIAGNOSIS — Z992 Dependence on renal dialysis: Secondary | ICD-10-CM | POA: Diagnosis not present

## 2018-03-22 DIAGNOSIS — Z992 Dependence on renal dialysis: Secondary | ICD-10-CM | POA: Diagnosis not present

## 2018-03-22 DIAGNOSIS — N186 End stage renal disease: Secondary | ICD-10-CM | POA: Diagnosis not present

## 2018-03-22 DIAGNOSIS — Z23 Encounter for immunization: Secondary | ICD-10-CM | POA: Diagnosis not present

## 2018-03-24 DIAGNOSIS — Z992 Dependence on renal dialysis: Secondary | ICD-10-CM | POA: Diagnosis not present

## 2018-03-24 DIAGNOSIS — N186 End stage renal disease: Secondary | ICD-10-CM | POA: Diagnosis not present

## 2018-03-24 DIAGNOSIS — Z23 Encounter for immunization: Secondary | ICD-10-CM | POA: Diagnosis not present

## 2018-03-26 DIAGNOSIS — Z23 Encounter for immunization: Secondary | ICD-10-CM | POA: Diagnosis not present

## 2018-03-26 DIAGNOSIS — Z992 Dependence on renal dialysis: Secondary | ICD-10-CM | POA: Diagnosis not present

## 2018-03-26 DIAGNOSIS — N186 End stage renal disease: Secondary | ICD-10-CM | POA: Diagnosis not present

## 2018-03-28 DIAGNOSIS — Z992 Dependence on renal dialysis: Secondary | ICD-10-CM | POA: Diagnosis not present

## 2018-03-28 DIAGNOSIS — N186 End stage renal disease: Secondary | ICD-10-CM | POA: Diagnosis not present

## 2018-03-28 DIAGNOSIS — Z23 Encounter for immunization: Secondary | ICD-10-CM | POA: Diagnosis not present

## 2018-03-30 DIAGNOSIS — N186 End stage renal disease: Secondary | ICD-10-CM | POA: Diagnosis not present

## 2018-03-30 DIAGNOSIS — Z992 Dependence on renal dialysis: Secondary | ICD-10-CM | POA: Diagnosis not present

## 2018-03-30 DIAGNOSIS — Z23 Encounter for immunization: Secondary | ICD-10-CM | POA: Diagnosis not present

## 2018-04-01 DIAGNOSIS — Z23 Encounter for immunization: Secondary | ICD-10-CM | POA: Diagnosis not present

## 2018-04-01 DIAGNOSIS — Z992 Dependence on renal dialysis: Secondary | ICD-10-CM | POA: Diagnosis not present

## 2018-04-01 DIAGNOSIS — N186 End stage renal disease: Secondary | ICD-10-CM | POA: Diagnosis not present

## 2018-04-04 DIAGNOSIS — Z992 Dependence on renal dialysis: Secondary | ICD-10-CM | POA: Diagnosis not present

## 2018-04-04 DIAGNOSIS — N186 End stage renal disease: Secondary | ICD-10-CM | POA: Diagnosis not present

## 2018-04-04 DIAGNOSIS — Z23 Encounter for immunization: Secondary | ICD-10-CM | POA: Diagnosis not present

## 2018-04-05 DIAGNOSIS — M14672 Charcot's joint, left ankle and foot: Secondary | ICD-10-CM | POA: Diagnosis not present

## 2018-04-06 DIAGNOSIS — Z23 Encounter for immunization: Secondary | ICD-10-CM | POA: Diagnosis not present

## 2018-04-06 DIAGNOSIS — N186 End stage renal disease: Secondary | ICD-10-CM | POA: Diagnosis not present

## 2018-04-06 DIAGNOSIS — Z992 Dependence on renal dialysis: Secondary | ICD-10-CM | POA: Diagnosis not present

## 2018-04-07 DIAGNOSIS — M14672 Charcot's joint, left ankle and foot: Secondary | ICD-10-CM | POA: Diagnosis not present

## 2018-04-08 DIAGNOSIS — Z992 Dependence on renal dialysis: Secondary | ICD-10-CM | POA: Diagnosis not present

## 2018-04-08 DIAGNOSIS — Z23 Encounter for immunization: Secondary | ICD-10-CM | POA: Diagnosis not present

## 2018-04-08 DIAGNOSIS — N186 End stage renal disease: Secondary | ICD-10-CM | POA: Diagnosis not present

## 2018-04-11 DIAGNOSIS — Z23 Encounter for immunization: Secondary | ICD-10-CM | POA: Diagnosis not present

## 2018-04-11 DIAGNOSIS — Z992 Dependence on renal dialysis: Secondary | ICD-10-CM | POA: Diagnosis not present

## 2018-04-11 DIAGNOSIS — N186 End stage renal disease: Secondary | ICD-10-CM | POA: Diagnosis not present

## 2018-04-13 DIAGNOSIS — Z992 Dependence on renal dialysis: Secondary | ICD-10-CM | POA: Diagnosis not present

## 2018-04-13 DIAGNOSIS — N186 End stage renal disease: Secondary | ICD-10-CM | POA: Diagnosis not present

## 2018-04-13 DIAGNOSIS — Z23 Encounter for immunization: Secondary | ICD-10-CM | POA: Diagnosis not present

## 2018-04-15 DIAGNOSIS — Z992 Dependence on renal dialysis: Secondary | ICD-10-CM | POA: Diagnosis not present

## 2018-04-15 DIAGNOSIS — N186 End stage renal disease: Secondary | ICD-10-CM | POA: Diagnosis not present

## 2018-04-15 DIAGNOSIS — Z23 Encounter for immunization: Secondary | ICD-10-CM | POA: Diagnosis not present

## 2018-04-18 DIAGNOSIS — Z992 Dependence on renal dialysis: Secondary | ICD-10-CM | POA: Diagnosis not present

## 2018-04-18 DIAGNOSIS — Z23 Encounter for immunization: Secondary | ICD-10-CM | POA: Diagnosis not present

## 2018-04-18 DIAGNOSIS — N186 End stage renal disease: Secondary | ICD-10-CM | POA: Diagnosis not present

## 2018-04-20 DIAGNOSIS — Z992 Dependence on renal dialysis: Secondary | ICD-10-CM | POA: Diagnosis not present

## 2018-04-20 DIAGNOSIS — Z23 Encounter for immunization: Secondary | ICD-10-CM | POA: Diagnosis not present

## 2018-04-20 DIAGNOSIS — N186 End stage renal disease: Secondary | ICD-10-CM | POA: Diagnosis not present

## 2018-04-22 DIAGNOSIS — Z23 Encounter for immunization: Secondary | ICD-10-CM | POA: Diagnosis not present

## 2018-04-22 DIAGNOSIS — Z992 Dependence on renal dialysis: Secondary | ICD-10-CM | POA: Diagnosis not present

## 2018-04-22 DIAGNOSIS — N186 End stage renal disease: Secondary | ICD-10-CM | POA: Diagnosis not present

## 2018-04-25 DIAGNOSIS — Z992 Dependence on renal dialysis: Secondary | ICD-10-CM | POA: Diagnosis not present

## 2018-04-25 DIAGNOSIS — Z23 Encounter for immunization: Secondary | ICD-10-CM | POA: Diagnosis not present

## 2018-04-25 DIAGNOSIS — N186 End stage renal disease: Secondary | ICD-10-CM | POA: Diagnosis not present

## 2018-04-27 DIAGNOSIS — N186 End stage renal disease: Secondary | ICD-10-CM | POA: Diagnosis not present

## 2018-04-27 DIAGNOSIS — Z992 Dependence on renal dialysis: Secondary | ICD-10-CM | POA: Diagnosis not present

## 2018-04-27 DIAGNOSIS — Z23 Encounter for immunization: Secondary | ICD-10-CM | POA: Diagnosis not present

## 2018-04-29 DIAGNOSIS — Z23 Encounter for immunization: Secondary | ICD-10-CM | POA: Diagnosis not present

## 2018-04-29 DIAGNOSIS — N186 End stage renal disease: Secondary | ICD-10-CM | POA: Diagnosis not present

## 2018-04-29 DIAGNOSIS — Z992 Dependence on renal dialysis: Secondary | ICD-10-CM | POA: Diagnosis not present

## 2018-05-02 DIAGNOSIS — Z23 Encounter for immunization: Secondary | ICD-10-CM | POA: Diagnosis not present

## 2018-05-02 DIAGNOSIS — N186 End stage renal disease: Secondary | ICD-10-CM | POA: Diagnosis not present

## 2018-05-02 DIAGNOSIS — Z992 Dependence on renal dialysis: Secondary | ICD-10-CM | POA: Diagnosis not present

## 2018-05-04 DIAGNOSIS — Z992 Dependence on renal dialysis: Secondary | ICD-10-CM | POA: Diagnosis not present

## 2018-05-04 DIAGNOSIS — Z23 Encounter for immunization: Secondary | ICD-10-CM | POA: Diagnosis not present

## 2018-05-04 DIAGNOSIS — N186 End stage renal disease: Secondary | ICD-10-CM | POA: Diagnosis not present

## 2018-05-05 DIAGNOSIS — N186 End stage renal disease: Secondary | ICD-10-CM | POA: Diagnosis not present

## 2018-05-06 DIAGNOSIS — Z23 Encounter for immunization: Secondary | ICD-10-CM | POA: Diagnosis not present

## 2018-05-06 DIAGNOSIS — N186 End stage renal disease: Secondary | ICD-10-CM | POA: Diagnosis not present

## 2018-05-06 DIAGNOSIS — Z992 Dependence on renal dialysis: Secondary | ICD-10-CM | POA: Diagnosis not present

## 2018-05-07 DIAGNOSIS — N186 End stage renal disease: Secondary | ICD-10-CM | POA: Diagnosis not present

## 2018-05-07 DIAGNOSIS — Z992 Dependence on renal dialysis: Secondary | ICD-10-CM | POA: Diagnosis not present

## 2018-05-07 IMAGING — US US RENAL
1 series · 14 of 25 positions shown · non-contrast
Comparison: Noncontrast abdominal and pelvic CT scan November 28, 2013

CLINICAL DATA: Chronic renal insufficiency stage III

EXAM:
RENAL / URINARY TRACT ULTRASOUND COMPLETE

[Series 1: us renal · 0.24mm/px · 14 of 54 slices shown]
[im 1/54]
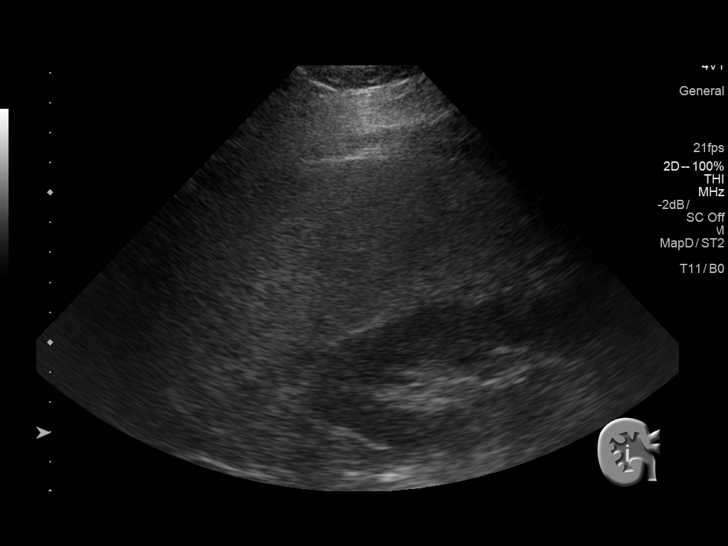
[im 5/54]
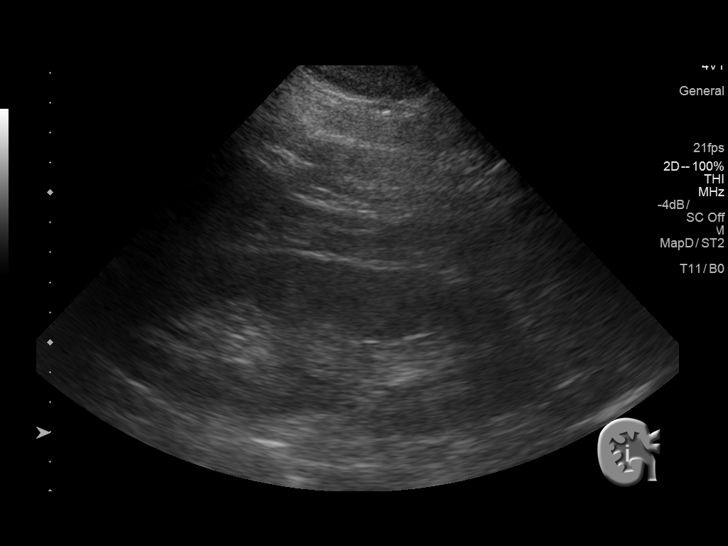
[im 9/54]
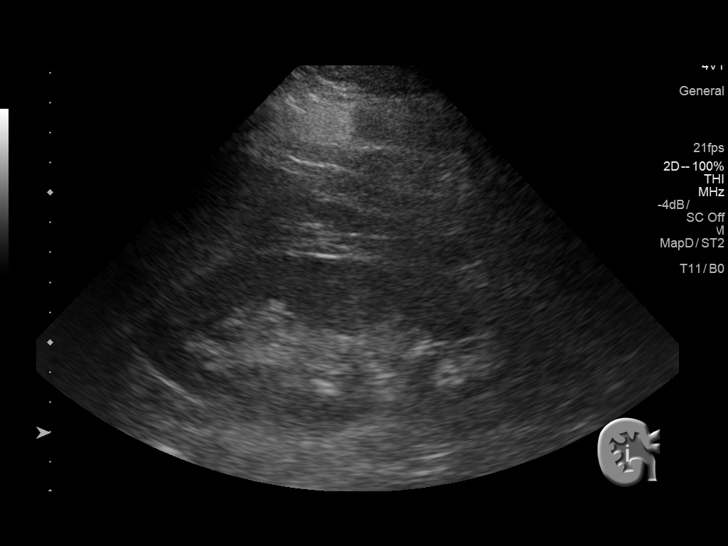
[im 14/54]
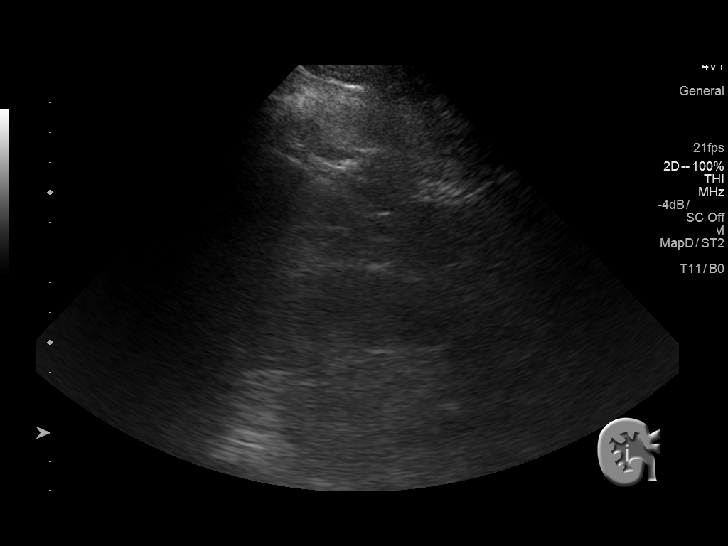
[im 18/54]
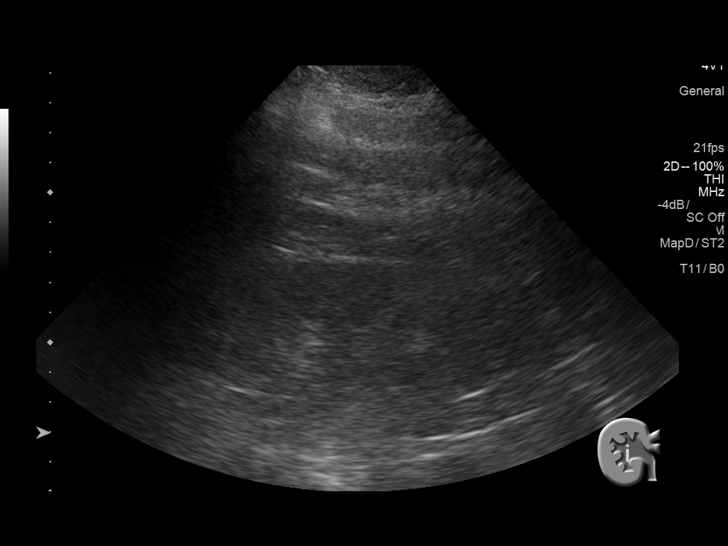
[im 20/54]
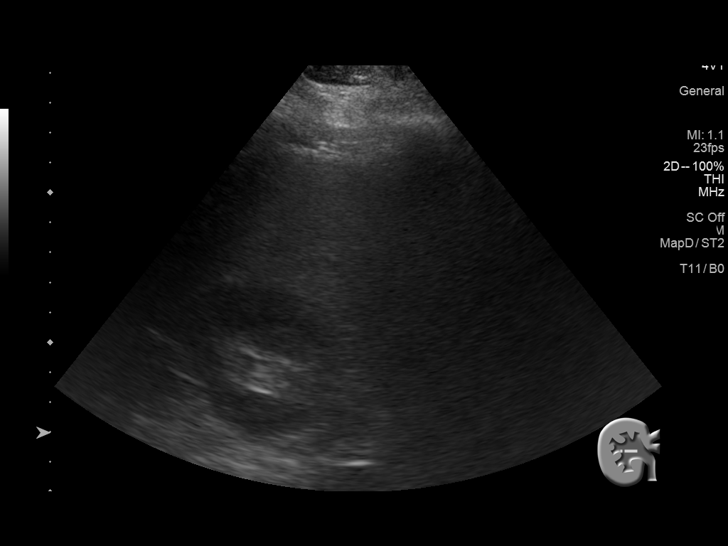
[im 25/54]
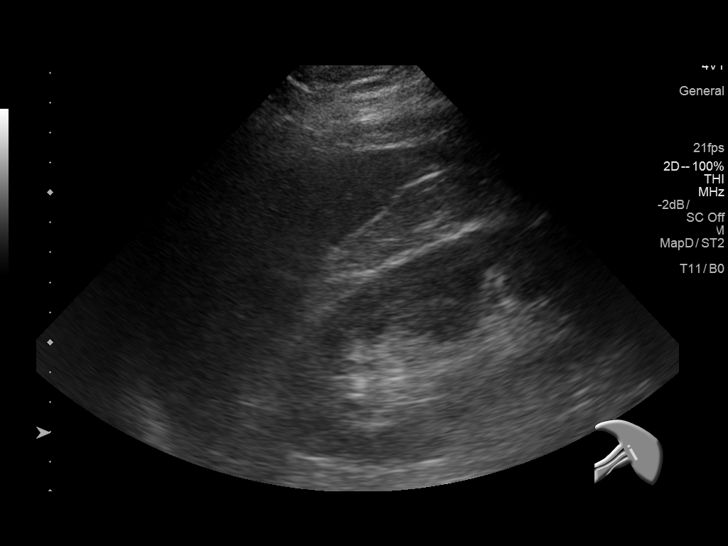
[im 29/54]
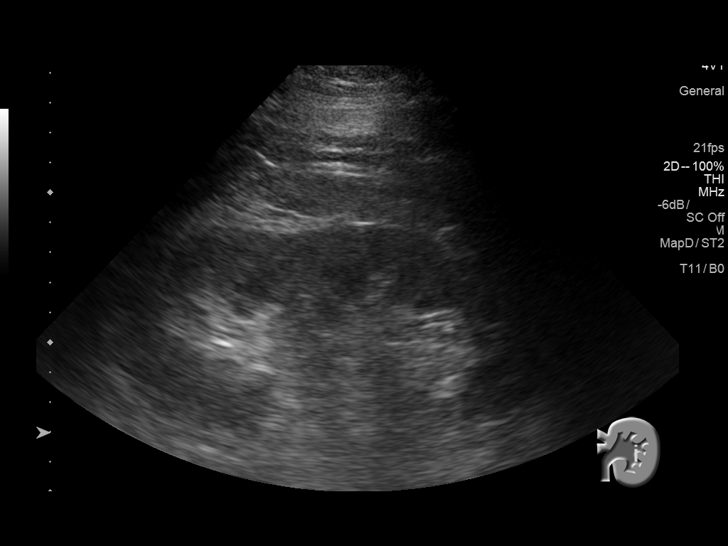
[im 34/54]
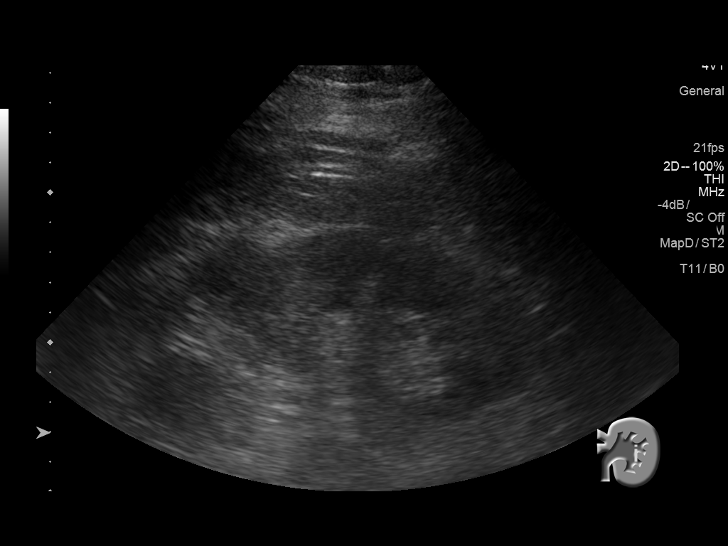
[im 36/54]
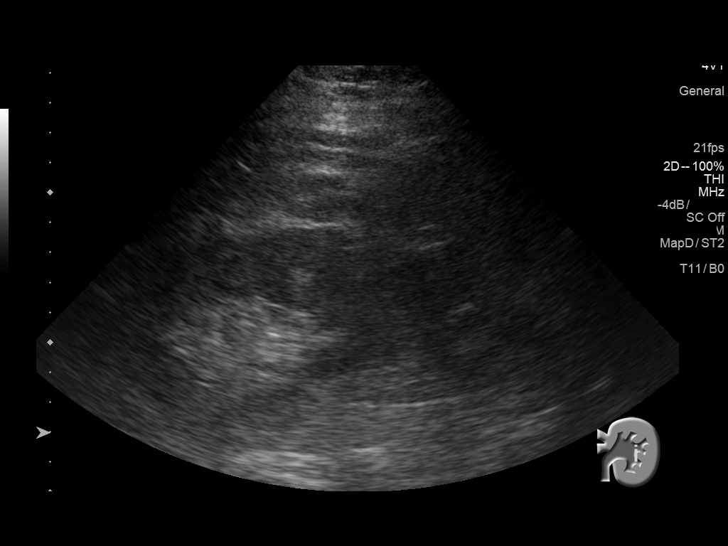
[im 40/54]
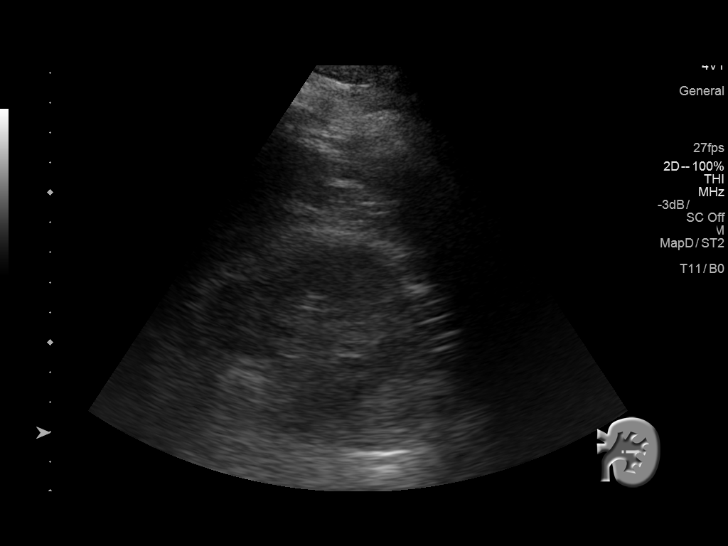
[im 45/54]
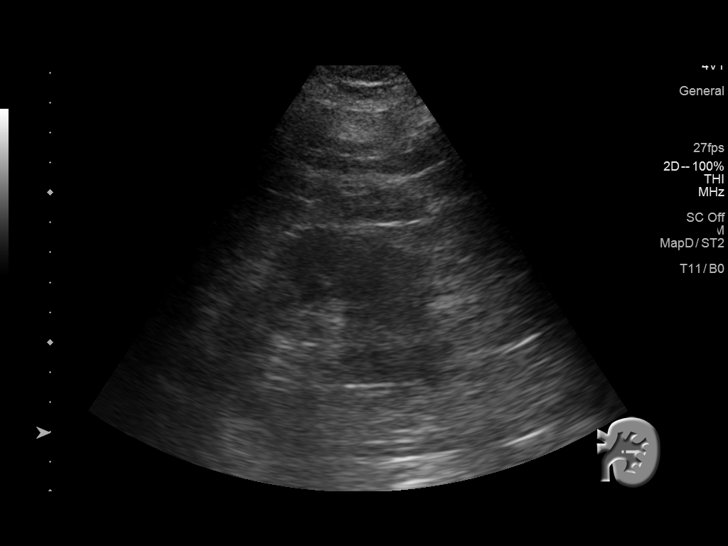
[im 49/54]
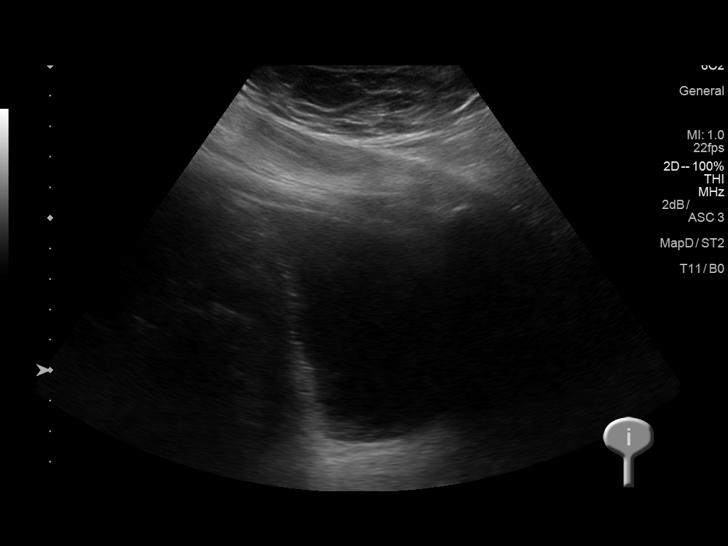
[im 54/54]
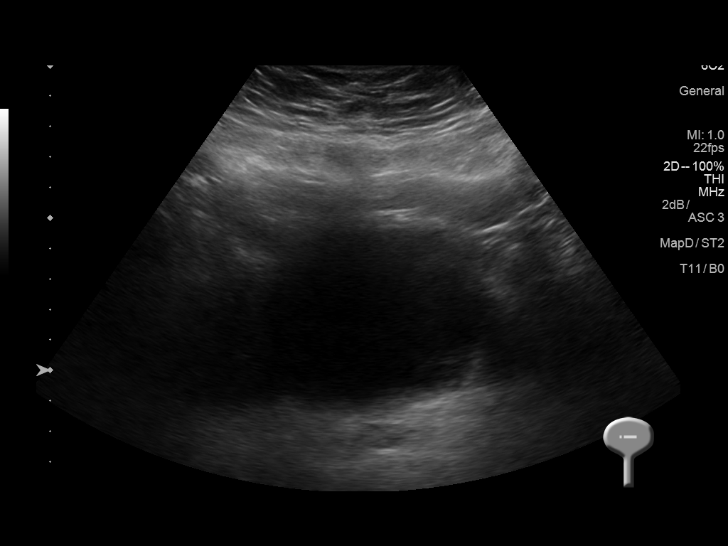

[14 of 25 positions shown; findings below may reference images not displayed]

FINDINGS: Right Kidney:

Length: 14.9 cm. The renal cortical echotexture remains lower than
that of the adjacent liver. There is no focal mass nor
hydronephrosis.

Left Kidney:

Length: 14.9 cm. The renal cortical echotexture is similar to that
on the right. There is no focal mass nor hydronephrosis.

Bladder:

Appears normal for degree of bladder distention.

Incidental note is made of increased hepatic echotexture which
likely reflects fatty infiltrative change which was observed on the
November 2013 CT scan.
IMPRESSION: Elongated kidneys which may reflect the patient's body habitus. No
acute or significant chronic parenchymal abnormality is observed.
There is no hydronephrosis.

## 2018-05-09 DIAGNOSIS — Z992 Dependence on renal dialysis: Secondary | ICD-10-CM | POA: Diagnosis not present

## 2018-05-09 DIAGNOSIS — Z23 Encounter for immunization: Secondary | ICD-10-CM | POA: Diagnosis not present

## 2018-05-09 DIAGNOSIS — N186 End stage renal disease: Secondary | ICD-10-CM | POA: Diagnosis not present

## 2018-05-11 ENCOUNTER — Other Ambulatory Visit: Payer: Self-pay | Admitting: Internal Medicine

## 2018-05-11 DIAGNOSIS — Z992 Dependence on renal dialysis: Secondary | ICD-10-CM | POA: Diagnosis not present

## 2018-05-11 DIAGNOSIS — Z23 Encounter for immunization: Secondary | ICD-10-CM | POA: Diagnosis not present

## 2018-05-11 DIAGNOSIS — N186 End stage renal disease: Secondary | ICD-10-CM | POA: Diagnosis not present

## 2018-05-12 DIAGNOSIS — M14672 Charcot's joint, left ankle and foot: Secondary | ICD-10-CM | POA: Diagnosis not present

## 2018-05-12 DIAGNOSIS — Z4789 Encounter for other orthopedic aftercare: Secondary | ICD-10-CM | POA: Diagnosis not present

## 2018-05-13 DIAGNOSIS — Z23 Encounter for immunization: Secondary | ICD-10-CM | POA: Diagnosis not present

## 2018-05-13 DIAGNOSIS — Z992 Dependence on renal dialysis: Secondary | ICD-10-CM | POA: Diagnosis not present

## 2018-05-13 DIAGNOSIS — N186 End stage renal disease: Secondary | ICD-10-CM | POA: Diagnosis not present

## 2018-05-16 DIAGNOSIS — N186 End stage renal disease: Secondary | ICD-10-CM | POA: Diagnosis not present

## 2018-05-16 DIAGNOSIS — Z23 Encounter for immunization: Secondary | ICD-10-CM | POA: Diagnosis not present

## 2018-05-16 DIAGNOSIS — Z992 Dependence on renal dialysis: Secondary | ICD-10-CM | POA: Diagnosis not present

## 2018-05-18 DIAGNOSIS — N186 End stage renal disease: Secondary | ICD-10-CM | POA: Diagnosis not present

## 2018-05-18 DIAGNOSIS — Z23 Encounter for immunization: Secondary | ICD-10-CM | POA: Diagnosis not present

## 2018-05-18 DIAGNOSIS — Z992 Dependence on renal dialysis: Secondary | ICD-10-CM | POA: Diagnosis not present

## 2018-05-20 DIAGNOSIS — Z992 Dependence on renal dialysis: Secondary | ICD-10-CM | POA: Diagnosis not present

## 2018-05-20 DIAGNOSIS — N186 End stage renal disease: Secondary | ICD-10-CM | POA: Diagnosis not present

## 2018-05-20 DIAGNOSIS — Z23 Encounter for immunization: Secondary | ICD-10-CM | POA: Diagnosis not present

## 2018-05-23 DIAGNOSIS — Z992 Dependence on renal dialysis: Secondary | ICD-10-CM | POA: Diagnosis not present

## 2018-05-23 DIAGNOSIS — Z23 Encounter for immunization: Secondary | ICD-10-CM | POA: Diagnosis not present

## 2018-05-23 DIAGNOSIS — N186 End stage renal disease: Secondary | ICD-10-CM | POA: Diagnosis not present

## 2018-05-25 DIAGNOSIS — Z992 Dependence on renal dialysis: Secondary | ICD-10-CM | POA: Diagnosis not present

## 2018-05-25 DIAGNOSIS — N186 End stage renal disease: Secondary | ICD-10-CM | POA: Diagnosis not present

## 2018-05-25 DIAGNOSIS — Z23 Encounter for immunization: Secondary | ICD-10-CM | POA: Diagnosis not present

## 2018-05-27 DIAGNOSIS — Z992 Dependence on renal dialysis: Secondary | ICD-10-CM | POA: Diagnosis not present

## 2018-05-27 DIAGNOSIS — Z23 Encounter for immunization: Secondary | ICD-10-CM | POA: Diagnosis not present

## 2018-05-27 DIAGNOSIS — N186 End stage renal disease: Secondary | ICD-10-CM | POA: Diagnosis not present

## 2018-05-30 DIAGNOSIS — N186 End stage renal disease: Secondary | ICD-10-CM | POA: Diagnosis not present

## 2018-05-30 DIAGNOSIS — Z23 Encounter for immunization: Secondary | ICD-10-CM | POA: Diagnosis not present

## 2018-05-30 DIAGNOSIS — Z992 Dependence on renal dialysis: Secondary | ICD-10-CM | POA: Diagnosis not present

## 2018-06-01 DIAGNOSIS — Z23 Encounter for immunization: Secondary | ICD-10-CM | POA: Diagnosis not present

## 2018-06-01 DIAGNOSIS — Z992 Dependence on renal dialysis: Secondary | ICD-10-CM | POA: Diagnosis not present

## 2018-06-01 DIAGNOSIS — N186 End stage renal disease: Secondary | ICD-10-CM | POA: Diagnosis not present

## 2018-06-03 DIAGNOSIS — N186 End stage renal disease: Secondary | ICD-10-CM | POA: Diagnosis not present

## 2018-06-03 DIAGNOSIS — Z23 Encounter for immunization: Secondary | ICD-10-CM | POA: Diagnosis not present

## 2018-06-03 DIAGNOSIS — Z992 Dependence on renal dialysis: Secondary | ICD-10-CM | POA: Diagnosis not present

## 2018-06-06 DIAGNOSIS — Z23 Encounter for immunization: Secondary | ICD-10-CM | POA: Diagnosis not present

## 2018-06-06 DIAGNOSIS — Z992 Dependence on renal dialysis: Secondary | ICD-10-CM | POA: Diagnosis not present

## 2018-06-06 DIAGNOSIS — N186 End stage renal disease: Secondary | ICD-10-CM | POA: Diagnosis not present

## 2018-06-08 DIAGNOSIS — N186 End stage renal disease: Secondary | ICD-10-CM | POA: Diagnosis not present

## 2018-06-08 DIAGNOSIS — Z992 Dependence on renal dialysis: Secondary | ICD-10-CM | POA: Diagnosis not present

## 2018-06-10 DIAGNOSIS — Z992 Dependence on renal dialysis: Secondary | ICD-10-CM | POA: Diagnosis not present

## 2018-06-10 DIAGNOSIS — N186 End stage renal disease: Secondary | ICD-10-CM | POA: Diagnosis not present

## 2018-06-13 ENCOUNTER — Encounter: Payer: Self-pay | Admitting: Internal Medicine

## 2018-06-13 DIAGNOSIS — N186 End stage renal disease: Secondary | ICD-10-CM | POA: Diagnosis not present

## 2018-06-13 DIAGNOSIS — Z992 Dependence on renal dialysis: Secondary | ICD-10-CM | POA: Diagnosis not present

## 2018-06-14 ENCOUNTER — Ambulatory Visit (INDEPENDENT_AMBULATORY_CARE_PROVIDER_SITE_OTHER): Payer: BLUE CROSS/BLUE SHIELD | Admitting: Internal Medicine

## 2018-06-14 ENCOUNTER — Other Ambulatory Visit: Payer: Self-pay

## 2018-06-14 ENCOUNTER — Encounter: Payer: Self-pay | Admitting: Internal Medicine

## 2018-06-14 DIAGNOSIS — E1122 Type 2 diabetes mellitus with diabetic chronic kidney disease: Secondary | ICD-10-CM

## 2018-06-14 DIAGNOSIS — E785 Hyperlipidemia, unspecified: Secondary | ICD-10-CM

## 2018-06-14 DIAGNOSIS — M14672 Charcot's joint, left ankle and foot: Secondary | ICD-10-CM | POA: Diagnosis not present

## 2018-06-14 NOTE — Patient Instructions (Signed)
Please continue: - Basaglar 25 units daily at bedtime - Ozempic 0.5 mg weekly  Please return in 3-4 months with your sugar log.

## 2018-06-14 NOTE — Progress Notes (Signed)
Patient ID: Kelten Hochstein, male   DOB: 30-Nov-1965, 53 y.o.   MRN: [id number]   Patient location: Home My location: Office  Referring Provider: Jarrett Soho, PA  I connected with the patient on 06/14/18 at  8:42 AM EDT by a video enabled telemedicine application and verified that I am speaking with the correct person.   I discussed the limitations of evaluation and management by telemedicine and the availability of in person appointments. The patient expressed understanding and agreed to proceed.   Details of the encounter are shown below.  HPI: Kyian Teigen is a 53 y.o.-year-old male, initially referred by his PCP, Jarrett Soho, PA, presenting for f/u for DM2, dx in ~2000, prev. insulin-dependent since 2017, uncontrolled, with complications (CKD, PN - + Charcot foot L ankle). Last visit 3 months ago.   Since last visit he started hemodialysis at 5:45-11 am MWF, but preparing for peritoneal dialysis - after 08/2018. Marland Kitchen  Last hemoglobin A1c was: Lab Results  Component Value Date   HGBA1C 7.8 (A) 03/14/2018   HGBA1C 6.8 (A) 11/10/2017   HGBA1C 6.9 (H) 11/09/2017  10/22/2016: HbA1c 9.2%  Pt is on a regimen of: - Basaglar 25 units at bedtime - Ozempic 0.5 mg weekly-started 03/2018 We stopped Amaryl in 03/2018. Stopped Metformin 1000 mg 2x a day, with meals due to poor kidney function He was previously on Amaryl 2 mg before dinner - started 10/2016, stopped 01/2014 due to lows  Pt checks his sugars 1-2 times a day: - am: 190-120s, 174 (no insulin) >> 95-140s >> 93, 115-120 - 2h after b'fast: n/c  - before lunch: 150s >> 120-130 >> 120s >> 115-120 - 2h after lunch: n/c - before dinner:150-160 >> snack: 160-210 >> 150s (no snacks) - 2h after dinner: n/c >> 126-143 >> n/c - bedtime:150-160 >> n/c >> 150-180 >> 120-150 - nighttime: n/c Lowest sugar was 90s >> 93; hypoglycemia awareness in the 60s. Highest sugar was 210 >> 210.  Glucometer: [employer]  Pt's meals are: -  Breakfast: cereal; sausage and eggs - Lunch: ham and cheese sandwich, salads, pot pie - Dinner: salad, spaghetti, grilled chicken and steamed veggies - Snacks: cottage cheese He was drinking regular sodas but stopped in 2018.  -+ CKD- will start peritoneal dialysis; last BUN/creatinine: 12/17/2017: 65/4.75 10/15/2017: 57/3.87, GFR 17 07/01/2017: ?/2.88, GFR 24, Protein/creatinie ratio: 13,978 05/14/2017: ?/2.42, GFR 30 10/22/2016: 44/1.84, GFR 39, glucose 182  No results found for: BUN, CREATININE  On lisinopril 20.  -+ HL; last set of lipids: 12/17/2017: 205/182/35/134 10/22/2016: 208/236/45/115 No results found for: CHOL, HDL, LDLCALC, LDLDIRECT, TRIG, CHOLHDL  On Lipitor 40 >> Crestor 10.  - last eye exam was in 2017: ? DR (no records)  -+ Numbness and tingling in his feet.  He had Charcot foot surgery in 05/2017 and developed an infection afterwards and had to have bone graft surgery.  Sugars were higher around that time.  Pt has FH of DM in mother, sister (GDM).  He also had HTN >> now improved >> stopped his hypertensives.  She is on disability.  ROS: Constitutional: no weight gain/+ weight loss, no fatigue, no subjective hyperthermia, no subjective hypothermia Eyes: no blurry vision, no xerophthalmia ENT: no sore throat, no nodules palpated in neck, no dysphagia, no odynophagia, no hoarseness Cardiovascular: no CP/no SOB/no palpitations/no leg swelling Respiratory: no cough/no SOB/no wheezing Gastrointestinal: no N/no V/no D/no C/no acid reflux Musculoskeletal: no muscle aches/no joint aches Skin: no rashes, no hair loss Neurological: no tremors/+ numbness/+ tingling/no  dizziness  I reviewed pt's medications, allergies, PMH, social hx, family hx, and changes were documented in the history of present illness. Otherwise, unchanged from my initial visit note.  Past Medical History:  Diagnosis Date  . 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  Chronic kidney disease   . Diabetes mellitus without complication (HCC)    type 2  . High cholesterol   . Hypertension   . Neuropathy    Past Surgical History:  Procedure Laterality Date  . ANKLE SURGERY     multiple ankle surgeries, with hardware and hardware removal at [facility]  . AV FISTULA PLACEMENT Right 11/09/2017   Procedure: Creation of Right Upper Arm Brachiocephalic ARTERIOVENOUS (AV) FISTULA;  Surgeon: Chuck Hintickson, Christopher S, MD;  [facility];  Service: Vascular;  Laterality: Right;  . CARPAL TUNNEL RELEASE Bilateral   . COLONOSCOPY     Social History   Social History  . Marital status: Married    Spouse name: N/A  . Number of children: 2   Occupational History  . Out of work   Social History Main Topics  . Smoking status: Former Smoker - quit 2004    Years: 10.00  . Smokeless tobacco: Never Used  . Alcohol use No  . Drug use: No   Current Outpatient Medications on File Prior to Visit  Medication Sig Dispense Refill  . amLODipine (NORVASC) 10 MG tablet Take 10 mg by mouth every evening.     . BD PEN NEEDLE NANO U/F 32G X 4 MM MISC 1 EACH BY DOES NOT APPLY ROUTE DAILY. WITH BASAGLAR PEN 100 each 0  . calcium acetate (PHOSLO) 667 MG capsule Take 667 mg by mouth 3 (three) times daily with meals.     . furosemide (LASIX) 20 MG tablet Take 20 mg by mouth 2 (two) times daily.  4  . gabapentin (NEURONTIN) 100 MG capsule Take 100 mg by mouth 2 (two) times daily.    . Insulin Glargine (BASAGLAR KWIKPEN) 100 UNIT/ML SOPN INJECT 25 UNITS INTO THE SKIN AT BEDTIME. (Patient taking differently: Inject 25 Units into the skin at bedtime. ) 45 mL 2  . rosuvastatin (CRESTOR) 10 MG tablet Take 10 mg by mouth daily.    . Semaglutide,0.25 or 0.5MG /DOS, (OZEMPIC, 0.25 OR 0.5 MG/DOSE,) 2 MG/1.5ML SOPN Inject 0.5 mg into the skin once a week. (Patient taking differently: Inject 0.5 mg into the skin every Sunday. ) 2 pen 5  . sodium bicarbonate 650 MG tablet Take 1,300 mg by mouth 2  (two) times daily.     . Vitamin D, Ergocalciferol, (DRISDOL) 50000 units CAPS capsule Take 50,000 Units by mouth every Monday.   0   No current facility-administered medications on file prior to visit.    No Known Allergies Family History  Problem Relation Age of Onset  . Heart failure Mother    Also; HTN, HL, heart ds, lung CA in Mother  PE: There were no vitals taken for this visit. Wt Readings from Last 3 Encounters:  03/14/18 (!) 326 lb (147.9 kg)  12/22/17 (!) 313 lb (142 kg)  11/10/17 (!) 305 lb (138.3 kg)   Constitutional:  in NAD  The physical exam was not performed (virtual visit).  ASSESSMENT: 1. DM2, insulin-dependent, uncontrolled, with complications - PN - CKD with proteinuria  - sees nephrology. 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  2. HL  3.  Obesity class III  PLAN:  1. Patient with longstanding, uncontrolled, type 2 diabetes, on basal insulin and now GLP-1 receptor agonist added at last visit.  We were able to stop Amaryl as his sugars improved on a more plant-based diet in the past, without concentrated sweets.  However, we had to add Amaryl back last year when he was taken off metformin due to poor kidney function.  He is preparing for peritoneal dialysis, now on HD MWF in am.  At last visit, sugars were higher after the holidays and we started the weekly GLP-1 receptor agonist (Ozempic) which is not affected by his poor kidney function and we stopped Amaryl. - his sugars are now much better after we switched from the sulfonylurea to GLP1 R agonist and also after starting HD. He is also now not fluid overloaded - he got rid of 25 lbs of fluid, which I think also helped. - his sugars are at goal with the exception of predinner sugars which are higher >> he is not snacking in the pm anymore and he feels his sugars are high 2/2  lunch >> we discussed to try to write down what he has for lunch to see if he can identify foods that raise his sugars. Since these sugars are still much improved than before >> I did not suggest any other changes in his regimen for now. - I suggested to:  Patient Instructions  Please continue: - Basaglar 25 units daily at bedtime - Ozempic 0.5 mg weekly  Please return in 3-4 months with your sugar log.   - We will check another HbA1c when he returns to the clinic. - continue checking sugars at different times of the day - check 1-2x a day, rotating checks - advised to schedule a yearly eye exam -  he is not UTD - Return to clinic in 3-4 mo with sugar log     2. HL - Reviewed latest lipid panel from 12/2017: LDL above goal, higher - Continues Crestor (changed from Lipitor 12/2017) without side effects. -He had another lipid panel before last visit but I did not obtain these records yet.  3.  Obesity class III -We started Ozempic at last visit, which should help with weight loss.  We also stopped Amaryl which should also help. -At this visit, he mentions he lost weight: 326 >> 301 lbs. Dry weight: 301 lbs.   Carlus Pavlov, MD PhD [facility] Endocrinology

## 2018-06-15 ENCOUNTER — Ambulatory Visit: Payer: BLUE CROSS/BLUE SHIELD | Admitting: Internal Medicine

## 2018-06-15 DIAGNOSIS — N186 End stage renal disease: Secondary | ICD-10-CM | POA: Diagnosis not present

## 2018-06-15 DIAGNOSIS — Z992 Dependence on renal dialysis: Secondary | ICD-10-CM | POA: Diagnosis not present

## 2018-06-17 DIAGNOSIS — Z992 Dependence on renal dialysis: Secondary | ICD-10-CM | POA: Diagnosis not present

## 2018-06-17 DIAGNOSIS — N186 End stage renal disease: Secondary | ICD-10-CM | POA: Diagnosis not present

## 2018-06-20 DIAGNOSIS — Z992 Dependence on renal dialysis: Secondary | ICD-10-CM | POA: Diagnosis not present

## 2018-06-20 DIAGNOSIS — N186 End stage renal disease: Secondary | ICD-10-CM | POA: Diagnosis not present

## 2018-06-22 DIAGNOSIS — Z992 Dependence on renal dialysis: Secondary | ICD-10-CM | POA: Diagnosis not present

## 2018-06-22 DIAGNOSIS — K219 Gastro-esophageal reflux disease without esophagitis: Secondary | ICD-10-CM | POA: Diagnosis not present

## 2018-06-22 DIAGNOSIS — N186 End stage renal disease: Secondary | ICD-10-CM | POA: Diagnosis not present

## 2018-06-24 DIAGNOSIS — Z992 Dependence on renal dialysis: Secondary | ICD-10-CM | POA: Diagnosis not present

## 2018-06-24 DIAGNOSIS — N186 End stage renal disease: Secondary | ICD-10-CM | POA: Diagnosis not present

## 2018-06-27 DIAGNOSIS — Z992 Dependence on renal dialysis: Secondary | ICD-10-CM | POA: Diagnosis not present

## 2018-06-27 DIAGNOSIS — N186 End stage renal disease: Secondary | ICD-10-CM | POA: Diagnosis not present

## 2018-06-29 DIAGNOSIS — N186 End stage renal disease: Secondary | ICD-10-CM | POA: Diagnosis not present

## 2018-06-29 DIAGNOSIS — Z992 Dependence on renal dialysis: Secondary | ICD-10-CM | POA: Diagnosis not present

## 2018-07-01 DIAGNOSIS — N186 End stage renal disease: Secondary | ICD-10-CM | POA: Diagnosis not present

## 2018-07-01 DIAGNOSIS — Z992 Dependence on renal dialysis: Secondary | ICD-10-CM | POA: Diagnosis not present

## 2018-07-04 DIAGNOSIS — N186 End stage renal disease: Secondary | ICD-10-CM | POA: Diagnosis not present

## 2018-07-04 DIAGNOSIS — Z992 Dependence on renal dialysis: Secondary | ICD-10-CM | POA: Diagnosis not present

## 2018-07-06 DIAGNOSIS — Z992 Dependence on renal dialysis: Secondary | ICD-10-CM | POA: Diagnosis not present

## 2018-07-06 DIAGNOSIS — N186 End stage renal disease: Secondary | ICD-10-CM | POA: Diagnosis not present

## 2018-07-07 DIAGNOSIS — N186 End stage renal disease: Secondary | ICD-10-CM | POA: Diagnosis not present

## 2018-07-07 DIAGNOSIS — Z992 Dependence on renal dialysis: Secondary | ICD-10-CM | POA: Diagnosis not present

## 2018-07-08 DIAGNOSIS — N186 End stage renal disease: Secondary | ICD-10-CM | POA: Diagnosis not present

## 2018-07-08 DIAGNOSIS — Z992 Dependence on renal dialysis: Secondary | ICD-10-CM | POA: Diagnosis not present

## 2018-07-11 DIAGNOSIS — N186 End stage renal disease: Secondary | ICD-10-CM | POA: Diagnosis not present

## 2018-07-11 DIAGNOSIS — Z992 Dependence on renal dialysis: Secondary | ICD-10-CM | POA: Diagnosis not present

## 2018-07-13 DIAGNOSIS — Z992 Dependence on renal dialysis: Secondary | ICD-10-CM | POA: Diagnosis not present

## 2018-07-13 DIAGNOSIS — N186 End stage renal disease: Secondary | ICD-10-CM | POA: Diagnosis not present

## 2018-07-15 DIAGNOSIS — N186 End stage renal disease: Secondary | ICD-10-CM | POA: Diagnosis not present

## 2018-07-15 DIAGNOSIS — Z992 Dependence on renal dialysis: Secondary | ICD-10-CM | POA: Diagnosis not present

## 2018-07-18 DIAGNOSIS — Z992 Dependence on renal dialysis: Secondary | ICD-10-CM | POA: Diagnosis not present

## 2018-07-18 DIAGNOSIS — N186 End stage renal disease: Secondary | ICD-10-CM | POA: Diagnosis not present

## 2018-07-20 DIAGNOSIS — Z992 Dependence on renal dialysis: Secondary | ICD-10-CM | POA: Diagnosis not present

## 2018-07-20 DIAGNOSIS — N186 End stage renal disease: Secondary | ICD-10-CM | POA: Diagnosis not present

## 2018-07-21 DIAGNOSIS — N186 End stage renal disease: Secondary | ICD-10-CM | POA: Diagnosis not present

## 2018-07-22 DIAGNOSIS — Z992 Dependence on renal dialysis: Secondary | ICD-10-CM | POA: Diagnosis not present

## 2018-07-22 DIAGNOSIS — N186 End stage renal disease: Secondary | ICD-10-CM | POA: Diagnosis not present

## 2018-07-25 DIAGNOSIS — N186 End stage renal disease: Secondary | ICD-10-CM | POA: Diagnosis not present

## 2018-07-25 DIAGNOSIS — Z992 Dependence on renal dialysis: Secondary | ICD-10-CM | POA: Diagnosis not present

## 2018-07-27 DIAGNOSIS — N186 End stage renal disease: Secondary | ICD-10-CM | POA: Diagnosis not present

## 2018-07-27 DIAGNOSIS — Z992 Dependence on renal dialysis: Secondary | ICD-10-CM | POA: Diagnosis not present

## 2018-07-29 DIAGNOSIS — Z992 Dependence on renal dialysis: Secondary | ICD-10-CM | POA: Diagnosis not present

## 2018-07-29 DIAGNOSIS — N186 End stage renal disease: Secondary | ICD-10-CM | POA: Diagnosis not present

## 2018-08-01 DIAGNOSIS — N186 End stage renal disease: Secondary | ICD-10-CM | POA: Diagnosis not present

## 2018-08-01 DIAGNOSIS — Z992 Dependence on renal dialysis: Secondary | ICD-10-CM | POA: Diagnosis not present

## 2018-08-03 DIAGNOSIS — N186 End stage renal disease: Secondary | ICD-10-CM | POA: Diagnosis not present

## 2018-08-03 DIAGNOSIS — Z992 Dependence on renal dialysis: Secondary | ICD-10-CM | POA: Diagnosis not present

## 2018-08-05 DIAGNOSIS — Z992 Dependence on renal dialysis: Secondary | ICD-10-CM | POA: Diagnosis not present

## 2018-08-05 DIAGNOSIS — N186 End stage renal disease: Secondary | ICD-10-CM | POA: Diagnosis not present

## 2018-08-07 DIAGNOSIS — N186 End stage renal disease: Secondary | ICD-10-CM | POA: Diagnosis not present

## 2018-08-07 DIAGNOSIS — Z992 Dependence on renal dialysis: Secondary | ICD-10-CM | POA: Diagnosis not present

## 2018-08-08 ENCOUNTER — Other Ambulatory Visit: Payer: Self-pay | Admitting: Internal Medicine

## 2018-08-08 DIAGNOSIS — Z992 Dependence on renal dialysis: Secondary | ICD-10-CM | POA: Diagnosis not present

## 2018-08-08 DIAGNOSIS — N186 End stage renal disease: Secondary | ICD-10-CM | POA: Diagnosis not present

## 2018-08-09 DIAGNOSIS — N186 End stage renal disease: Secondary | ICD-10-CM | POA: Diagnosis not present

## 2018-08-09 DIAGNOSIS — Z992 Dependence on renal dialysis: Secondary | ICD-10-CM | POA: Diagnosis not present

## 2018-08-10 DIAGNOSIS — N186 End stage renal disease: Secondary | ICD-10-CM | POA: Diagnosis not present

## 2018-08-10 DIAGNOSIS — Z992 Dependence on renal dialysis: Secondary | ICD-10-CM | POA: Diagnosis not present

## 2018-08-11 DIAGNOSIS — Z992 Dependence on renal dialysis: Secondary | ICD-10-CM | POA: Diagnosis not present

## 2018-08-11 DIAGNOSIS — N186 End stage renal disease: Secondary | ICD-10-CM | POA: Diagnosis not present

## 2018-08-12 DIAGNOSIS — Z992 Dependence on renal dialysis: Secondary | ICD-10-CM | POA: Diagnosis not present

## 2018-08-12 DIAGNOSIS — N186 End stage renal disease: Secondary | ICD-10-CM | POA: Diagnosis not present

## 2018-08-13 DIAGNOSIS — Z992 Dependence on renal dialysis: Secondary | ICD-10-CM | POA: Diagnosis not present

## 2018-08-13 DIAGNOSIS — N186 End stage renal disease: Secondary | ICD-10-CM | POA: Diagnosis not present

## 2018-08-15 DIAGNOSIS — N186 End stage renal disease: Secondary | ICD-10-CM | POA: Diagnosis not present

## 2018-08-15 DIAGNOSIS — Z992 Dependence on renal dialysis: Secondary | ICD-10-CM | POA: Diagnosis not present

## 2018-08-16 DIAGNOSIS — N186 End stage renal disease: Secondary | ICD-10-CM | POA: Diagnosis not present

## 2018-08-16 DIAGNOSIS — Z992 Dependence on renal dialysis: Secondary | ICD-10-CM | POA: Diagnosis not present

## 2018-08-17 DIAGNOSIS — Z992 Dependence on renal dialysis: Secondary | ICD-10-CM | POA: Diagnosis not present

## 2018-08-17 DIAGNOSIS — N186 End stage renal disease: Secondary | ICD-10-CM | POA: Diagnosis not present

## 2018-08-18 ENCOUNTER — Encounter (HOSPITAL_COMMUNITY): Admission: RE | Payer: Self-pay | Source: Home / Self Care

## 2018-08-18 ENCOUNTER — Ambulatory Visit (HOSPITAL_COMMUNITY)
Admission: RE | Admit: 2018-08-18 | Payer: BLUE CROSS/BLUE SHIELD | Source: Home / Self Care | Admitting: General Surgery

## 2018-08-18 DIAGNOSIS — N186 End stage renal disease: Secondary | ICD-10-CM | POA: Diagnosis not present

## 2018-08-18 DIAGNOSIS — Z992 Dependence on renal dialysis: Secondary | ICD-10-CM | POA: Diagnosis not present

## 2018-08-18 SURGERY — LAPAROSCOPIC INSERTION CONTINUOUS AMBULATORY PERITONEAL DIALYSIS  (CAPD) CATHETER
Anesthesia: General

## 2018-08-19 DIAGNOSIS — Z992 Dependence on renal dialysis: Secondary | ICD-10-CM | POA: Diagnosis not present

## 2018-08-19 DIAGNOSIS — N186 End stage renal disease: Secondary | ICD-10-CM | POA: Diagnosis not present

## 2018-08-20 DIAGNOSIS — Z992 Dependence on renal dialysis: Secondary | ICD-10-CM | POA: Diagnosis not present

## 2018-08-20 DIAGNOSIS — N186 End stage renal disease: Secondary | ICD-10-CM | POA: Diagnosis not present

## 2018-08-21 DIAGNOSIS — N186 End stage renal disease: Secondary | ICD-10-CM | POA: Diagnosis not present

## 2018-08-21 DIAGNOSIS — Z992 Dependence on renal dialysis: Secondary | ICD-10-CM | POA: Diagnosis not present

## 2018-08-22 DIAGNOSIS — Z992 Dependence on renal dialysis: Secondary | ICD-10-CM | POA: Diagnosis not present

## 2018-08-22 DIAGNOSIS — N186 End stage renal disease: Secondary | ICD-10-CM | POA: Diagnosis not present

## 2018-08-23 DIAGNOSIS — Z992 Dependence on renal dialysis: Secondary | ICD-10-CM | POA: Diagnosis not present

## 2018-08-23 DIAGNOSIS — N186 End stage renal disease: Secondary | ICD-10-CM | POA: Diagnosis not present

## 2018-08-24 DIAGNOSIS — N186 End stage renal disease: Secondary | ICD-10-CM | POA: Diagnosis not present

## 2018-08-24 DIAGNOSIS — Z992 Dependence on renal dialysis: Secondary | ICD-10-CM | POA: Diagnosis not present

## 2018-08-24 DIAGNOSIS — M14672 Charcot's joint, left ankle and foot: Secondary | ICD-10-CM | POA: Diagnosis not present

## 2018-08-25 DIAGNOSIS — Z992 Dependence on renal dialysis: Secondary | ICD-10-CM | POA: Diagnosis not present

## 2018-08-25 DIAGNOSIS — N186 End stage renal disease: Secondary | ICD-10-CM | POA: Diagnosis not present

## 2018-08-26 DIAGNOSIS — Z992 Dependence on renal dialysis: Secondary | ICD-10-CM | POA: Diagnosis not present

## 2018-08-26 DIAGNOSIS — N186 End stage renal disease: Secondary | ICD-10-CM | POA: Diagnosis not present

## 2018-08-27 DIAGNOSIS — N186 End stage renal disease: Secondary | ICD-10-CM | POA: Diagnosis not present

## 2018-08-27 DIAGNOSIS — Z992 Dependence on renal dialysis: Secondary | ICD-10-CM | POA: Diagnosis not present

## 2018-08-28 DIAGNOSIS — Z992 Dependence on renal dialysis: Secondary | ICD-10-CM | POA: Diagnosis not present

## 2018-08-28 DIAGNOSIS — N186 End stage renal disease: Secondary | ICD-10-CM | POA: Diagnosis not present

## 2018-08-29 DIAGNOSIS — N186 End stage renal disease: Secondary | ICD-10-CM | POA: Diagnosis not present

## 2018-08-29 DIAGNOSIS — Z992 Dependence on renal dialysis: Secondary | ICD-10-CM | POA: Diagnosis not present

## 2018-08-30 DIAGNOSIS — N186 End stage renal disease: Secondary | ICD-10-CM | POA: Diagnosis not present

## 2018-08-30 DIAGNOSIS — Z992 Dependence on renal dialysis: Secondary | ICD-10-CM | POA: Diagnosis not present

## 2018-08-31 DIAGNOSIS — Z992 Dependence on renal dialysis: Secondary | ICD-10-CM | POA: Diagnosis not present

## 2018-08-31 DIAGNOSIS — N186 End stage renal disease: Secondary | ICD-10-CM | POA: Diagnosis not present

## 2018-09-01 DIAGNOSIS — Z992 Dependence on renal dialysis: Secondary | ICD-10-CM | POA: Diagnosis not present

## 2018-09-01 DIAGNOSIS — N186 End stage renal disease: Secondary | ICD-10-CM | POA: Diagnosis not present

## 2018-09-02 DIAGNOSIS — Z992 Dependence on renal dialysis: Secondary | ICD-10-CM | POA: Diagnosis not present

## 2018-09-02 DIAGNOSIS — N186 End stage renal disease: Secondary | ICD-10-CM | POA: Diagnosis not present

## 2018-09-03 DIAGNOSIS — N186 End stage renal disease: Secondary | ICD-10-CM | POA: Diagnosis not present

## 2018-09-03 DIAGNOSIS — Z992 Dependence on renal dialysis: Secondary | ICD-10-CM | POA: Diagnosis not present

## 2018-09-04 DIAGNOSIS — N186 End stage renal disease: Secondary | ICD-10-CM | POA: Diagnosis not present

## 2018-09-04 DIAGNOSIS — Z992 Dependence on renal dialysis: Secondary | ICD-10-CM | POA: Diagnosis not present

## 2018-09-05 DIAGNOSIS — Z992 Dependence on renal dialysis: Secondary | ICD-10-CM | POA: Diagnosis not present

## 2018-09-05 DIAGNOSIS — N186 End stage renal disease: Secondary | ICD-10-CM | POA: Diagnosis not present

## 2018-09-06 DIAGNOSIS — N186 End stage renal disease: Secondary | ICD-10-CM | POA: Diagnosis not present

## 2018-09-06 DIAGNOSIS — Z992 Dependence on renal dialysis: Secondary | ICD-10-CM | POA: Diagnosis not present

## 2018-09-07 DIAGNOSIS — N186 End stage renal disease: Secondary | ICD-10-CM | POA: Diagnosis not present

## 2018-09-07 DIAGNOSIS — Z23 Encounter for immunization: Secondary | ICD-10-CM | POA: Diagnosis not present

## 2018-09-07 DIAGNOSIS — Z992 Dependence on renal dialysis: Secondary | ICD-10-CM | POA: Diagnosis not present

## 2018-09-08 DIAGNOSIS — Z23 Encounter for immunization: Secondary | ICD-10-CM | POA: Diagnosis not present

## 2018-09-08 DIAGNOSIS — Z992 Dependence on renal dialysis: Secondary | ICD-10-CM | POA: Diagnosis not present

## 2018-09-08 DIAGNOSIS — N186 End stage renal disease: Secondary | ICD-10-CM | POA: Diagnosis not present

## 2018-09-09 DIAGNOSIS — Z23 Encounter for immunization: Secondary | ICD-10-CM | POA: Diagnosis not present

## 2018-09-09 DIAGNOSIS — Z992 Dependence on renal dialysis: Secondary | ICD-10-CM | POA: Diagnosis not present

## 2018-09-09 DIAGNOSIS — N186 End stage renal disease: Secondary | ICD-10-CM | POA: Diagnosis not present

## 2018-09-10 DIAGNOSIS — N186 End stage renal disease: Secondary | ICD-10-CM | POA: Diagnosis not present

## 2018-09-10 DIAGNOSIS — Z992 Dependence on renal dialysis: Secondary | ICD-10-CM | POA: Diagnosis not present

## 2018-09-10 DIAGNOSIS — Z23 Encounter for immunization: Secondary | ICD-10-CM | POA: Diagnosis not present

## 2018-09-11 DIAGNOSIS — N186 End stage renal disease: Secondary | ICD-10-CM | POA: Diagnosis not present

## 2018-09-11 DIAGNOSIS — Z23 Encounter for immunization: Secondary | ICD-10-CM | POA: Diagnosis not present

## 2018-09-11 DIAGNOSIS — Z992 Dependence on renal dialysis: Secondary | ICD-10-CM | POA: Diagnosis not present

## 2018-09-12 DIAGNOSIS — M14672 Charcot's joint, left ankle and foot: Secondary | ICD-10-CM | POA: Diagnosis not present

## 2018-09-12 DIAGNOSIS — Z23 Encounter for immunization: Secondary | ICD-10-CM | POA: Diagnosis not present

## 2018-09-12 DIAGNOSIS — N186 End stage renal disease: Secondary | ICD-10-CM | POA: Diagnosis not present

## 2018-09-12 DIAGNOSIS — Z992 Dependence on renal dialysis: Secondary | ICD-10-CM | POA: Diagnosis not present

## 2018-09-13 DIAGNOSIS — N186 End stage renal disease: Secondary | ICD-10-CM | POA: Diagnosis not present

## 2018-09-13 DIAGNOSIS — Z992 Dependence on renal dialysis: Secondary | ICD-10-CM | POA: Diagnosis not present

## 2018-09-13 DIAGNOSIS — Z23 Encounter for immunization: Secondary | ICD-10-CM | POA: Diagnosis not present

## 2018-09-14 DIAGNOSIS — Z23 Encounter for immunization: Secondary | ICD-10-CM | POA: Diagnosis not present

## 2018-09-14 DIAGNOSIS — N186 End stage renal disease: Secondary | ICD-10-CM | POA: Diagnosis not present

## 2018-09-14 DIAGNOSIS — Z992 Dependence on renal dialysis: Secondary | ICD-10-CM | POA: Diagnosis not present

## 2018-09-15 DIAGNOSIS — Z23 Encounter for immunization: Secondary | ICD-10-CM | POA: Diagnosis not present

## 2018-09-15 DIAGNOSIS — Z992 Dependence on renal dialysis: Secondary | ICD-10-CM | POA: Diagnosis not present

## 2018-09-15 DIAGNOSIS — N186 End stage renal disease: Secondary | ICD-10-CM | POA: Diagnosis not present

## 2018-09-16 DIAGNOSIS — N186 End stage renal disease: Secondary | ICD-10-CM | POA: Diagnosis not present

## 2018-09-16 DIAGNOSIS — Z992 Dependence on renal dialysis: Secondary | ICD-10-CM | POA: Diagnosis not present

## 2018-09-16 DIAGNOSIS — Z23 Encounter for immunization: Secondary | ICD-10-CM | POA: Diagnosis not present

## 2018-09-17 DIAGNOSIS — N186 End stage renal disease: Secondary | ICD-10-CM | POA: Diagnosis not present

## 2018-09-17 DIAGNOSIS — Z23 Encounter for immunization: Secondary | ICD-10-CM | POA: Diagnosis not present

## 2018-09-17 DIAGNOSIS — Z992 Dependence on renal dialysis: Secondary | ICD-10-CM | POA: Diagnosis not present

## 2018-09-18 DIAGNOSIS — Z23 Encounter for immunization: Secondary | ICD-10-CM | POA: Diagnosis not present

## 2018-09-18 DIAGNOSIS — N186 End stage renal disease: Secondary | ICD-10-CM | POA: Diagnosis not present

## 2018-09-18 DIAGNOSIS — Z992 Dependence on renal dialysis: Secondary | ICD-10-CM | POA: Diagnosis not present

## 2018-09-19 DIAGNOSIS — Z23 Encounter for immunization: Secondary | ICD-10-CM | POA: Diagnosis not present

## 2018-09-19 DIAGNOSIS — N186 End stage renal disease: Secondary | ICD-10-CM | POA: Diagnosis not present

## 2018-09-19 DIAGNOSIS — Z992 Dependence on renal dialysis: Secondary | ICD-10-CM | POA: Diagnosis not present

## 2018-09-20 ENCOUNTER — Other Ambulatory Visit: Payer: Self-pay | Admitting: Internal Medicine

## 2018-09-20 DIAGNOSIS — N186 End stage renal disease: Secondary | ICD-10-CM | POA: Diagnosis not present

## 2018-09-20 DIAGNOSIS — E113413 Type 2 diabetes mellitus with severe nonproliferative diabetic retinopathy with macular edema, bilateral: Secondary | ICD-10-CM | POA: Diagnosis not present

## 2018-09-20 DIAGNOSIS — Z23 Encounter for immunization: Secondary | ICD-10-CM | POA: Diagnosis not present

## 2018-09-20 DIAGNOSIS — H3582 Retinal ischemia: Secondary | ICD-10-CM | POA: Diagnosis not present

## 2018-09-20 DIAGNOSIS — Q141 Congenital malformation of retina: Secondary | ICD-10-CM | POA: Diagnosis not present

## 2018-09-20 DIAGNOSIS — H2513 Age-related nuclear cataract, bilateral: Secondary | ICD-10-CM | POA: Diagnosis not present

## 2018-09-20 DIAGNOSIS — Z992 Dependence on renal dialysis: Secondary | ICD-10-CM | POA: Diagnosis not present

## 2018-09-21 DIAGNOSIS — Z992 Dependence on renal dialysis: Secondary | ICD-10-CM | POA: Diagnosis not present

## 2018-09-21 DIAGNOSIS — Z23 Encounter for immunization: Secondary | ICD-10-CM | POA: Diagnosis not present

## 2018-09-21 DIAGNOSIS — N186 End stage renal disease: Secondary | ICD-10-CM | POA: Diagnosis not present

## 2018-09-22 DIAGNOSIS — N186 End stage renal disease: Secondary | ICD-10-CM | POA: Diagnosis not present

## 2018-09-22 DIAGNOSIS — Z992 Dependence on renal dialysis: Secondary | ICD-10-CM | POA: Diagnosis not present

## 2018-09-22 DIAGNOSIS — Z23 Encounter for immunization: Secondary | ICD-10-CM | POA: Diagnosis not present

## 2018-09-23 DIAGNOSIS — Z23 Encounter for immunization: Secondary | ICD-10-CM | POA: Diagnosis not present

## 2018-09-23 DIAGNOSIS — Z992 Dependence on renal dialysis: Secondary | ICD-10-CM | POA: Diagnosis not present

## 2018-09-23 DIAGNOSIS — N186 End stage renal disease: Secondary | ICD-10-CM | POA: Diagnosis not present

## 2018-09-24 DIAGNOSIS — N186 End stage renal disease: Secondary | ICD-10-CM | POA: Diagnosis not present

## 2018-09-24 DIAGNOSIS — Z992 Dependence on renal dialysis: Secondary | ICD-10-CM | POA: Diagnosis not present

## 2018-09-24 DIAGNOSIS — Z23 Encounter for immunization: Secondary | ICD-10-CM | POA: Diagnosis not present

## 2018-09-25 DIAGNOSIS — Z23 Encounter for immunization: Secondary | ICD-10-CM | POA: Diagnosis not present

## 2018-09-25 DIAGNOSIS — Z992 Dependence on renal dialysis: Secondary | ICD-10-CM | POA: Diagnosis not present

## 2018-09-25 DIAGNOSIS — N186 End stage renal disease: Secondary | ICD-10-CM | POA: Diagnosis not present

## 2018-09-26 DIAGNOSIS — Z23 Encounter for immunization: Secondary | ICD-10-CM | POA: Diagnosis not present

## 2018-09-26 DIAGNOSIS — N186 End stage renal disease: Secondary | ICD-10-CM | POA: Diagnosis not present

## 2018-09-26 DIAGNOSIS — Z992 Dependence on renal dialysis: Secondary | ICD-10-CM | POA: Diagnosis not present

## 2018-09-27 DIAGNOSIS — N186 End stage renal disease: Secondary | ICD-10-CM | POA: Diagnosis not present

## 2018-09-27 DIAGNOSIS — Z992 Dependence on renal dialysis: Secondary | ICD-10-CM | POA: Diagnosis not present

## 2018-09-27 DIAGNOSIS — Z23 Encounter for immunization: Secondary | ICD-10-CM | POA: Diagnosis not present

## 2018-09-28 DIAGNOSIS — Z23 Encounter for immunization: Secondary | ICD-10-CM | POA: Diagnosis not present

## 2018-09-28 DIAGNOSIS — N186 End stage renal disease: Secondary | ICD-10-CM | POA: Diagnosis not present

## 2018-09-28 DIAGNOSIS — Z992 Dependence on renal dialysis: Secondary | ICD-10-CM | POA: Diagnosis not present

## 2018-09-29 DIAGNOSIS — Z23 Encounter for immunization: Secondary | ICD-10-CM | POA: Diagnosis not present

## 2018-09-29 DIAGNOSIS — N186 End stage renal disease: Secondary | ICD-10-CM | POA: Diagnosis not present

## 2018-09-29 DIAGNOSIS — Z992 Dependence on renal dialysis: Secondary | ICD-10-CM | POA: Diagnosis not present

## 2018-09-30 DIAGNOSIS — N186 End stage renal disease: Secondary | ICD-10-CM | POA: Diagnosis not present

## 2018-09-30 DIAGNOSIS — Z23 Encounter for immunization: Secondary | ICD-10-CM | POA: Diagnosis not present

## 2018-09-30 DIAGNOSIS — Z992 Dependence on renal dialysis: Secondary | ICD-10-CM | POA: Diagnosis not present

## 2018-10-01 DIAGNOSIS — N186 End stage renal disease: Secondary | ICD-10-CM | POA: Diagnosis not present

## 2018-10-01 DIAGNOSIS — Z23 Encounter for immunization: Secondary | ICD-10-CM | POA: Diagnosis not present

## 2018-10-01 DIAGNOSIS — Z992 Dependence on renal dialysis: Secondary | ICD-10-CM | POA: Diagnosis not present

## 2018-10-02 DIAGNOSIS — Z23 Encounter for immunization: Secondary | ICD-10-CM | POA: Diagnosis not present

## 2018-10-02 DIAGNOSIS — N186 End stage renal disease: Secondary | ICD-10-CM | POA: Diagnosis not present

## 2018-10-02 DIAGNOSIS — Z992 Dependence on renal dialysis: Secondary | ICD-10-CM | POA: Diagnosis not present

## 2018-10-03 DIAGNOSIS — N186 End stage renal disease: Secondary | ICD-10-CM | POA: Diagnosis not present

## 2018-10-03 DIAGNOSIS — Z992 Dependence on renal dialysis: Secondary | ICD-10-CM | POA: Diagnosis not present

## 2018-10-03 DIAGNOSIS — Z23 Encounter for immunization: Secondary | ICD-10-CM | POA: Diagnosis not present

## 2018-10-04 DIAGNOSIS — Z23 Encounter for immunization: Secondary | ICD-10-CM | POA: Diagnosis not present

## 2018-10-04 DIAGNOSIS — N186 End stage renal disease: Secondary | ICD-10-CM | POA: Diagnosis not present

## 2018-10-04 DIAGNOSIS — Z992 Dependence on renal dialysis: Secondary | ICD-10-CM | POA: Diagnosis not present

## 2018-10-05 DIAGNOSIS — N186 End stage renal disease: Secondary | ICD-10-CM | POA: Diagnosis not present

## 2018-10-05 DIAGNOSIS — Z992 Dependence on renal dialysis: Secondary | ICD-10-CM | POA: Diagnosis not present

## 2018-10-05 DIAGNOSIS — Z23 Encounter for immunization: Secondary | ICD-10-CM | POA: Diagnosis not present

## 2018-10-06 DIAGNOSIS — Z23 Encounter for immunization: Secondary | ICD-10-CM | POA: Diagnosis not present

## 2018-10-06 DIAGNOSIS — N186 End stage renal disease: Secondary | ICD-10-CM | POA: Diagnosis not present

## 2018-10-06 DIAGNOSIS — Z992 Dependence on renal dialysis: Secondary | ICD-10-CM | POA: Diagnosis not present

## 2018-10-07 DIAGNOSIS — N186 End stage renal disease: Secondary | ICD-10-CM | POA: Diagnosis not present

## 2018-10-07 DIAGNOSIS — Z992 Dependence on renal dialysis: Secondary | ICD-10-CM | POA: Diagnosis not present

## 2018-10-07 DIAGNOSIS — Z23 Encounter for immunization: Secondary | ICD-10-CM | POA: Diagnosis not present

## 2018-10-08 DIAGNOSIS — N186 End stage renal disease: Secondary | ICD-10-CM | POA: Diagnosis not present

## 2018-10-08 DIAGNOSIS — Z992 Dependence on renal dialysis: Secondary | ICD-10-CM | POA: Diagnosis not present

## 2018-10-09 DIAGNOSIS — Z992 Dependence on renal dialysis: Secondary | ICD-10-CM | POA: Diagnosis not present

## 2018-10-09 DIAGNOSIS — N186 End stage renal disease: Secondary | ICD-10-CM | POA: Diagnosis not present

## 2018-10-10 DIAGNOSIS — N186 End stage renal disease: Secondary | ICD-10-CM | POA: Diagnosis not present

## 2018-10-10 DIAGNOSIS — Z992 Dependence on renal dialysis: Secondary | ICD-10-CM | POA: Diagnosis not present

## 2018-10-11 DIAGNOSIS — N186 End stage renal disease: Secondary | ICD-10-CM | POA: Diagnosis not present

## 2018-10-11 DIAGNOSIS — Z992 Dependence on renal dialysis: Secondary | ICD-10-CM | POA: Diagnosis not present

## 2018-10-12 DIAGNOSIS — N186 End stage renal disease: Secondary | ICD-10-CM | POA: Diagnosis not present

## 2018-10-12 DIAGNOSIS — Z992 Dependence on renal dialysis: Secondary | ICD-10-CM | POA: Diagnosis not present

## 2018-10-13 DIAGNOSIS — N186 End stage renal disease: Secondary | ICD-10-CM | POA: Diagnosis not present

## 2018-10-13 DIAGNOSIS — Z992 Dependence on renal dialysis: Secondary | ICD-10-CM | POA: Diagnosis not present

## 2018-10-14 DIAGNOSIS — E113413 Type 2 diabetes mellitus with severe nonproliferative diabetic retinopathy with macular edema, bilateral: Secondary | ICD-10-CM | POA: Diagnosis not present

## 2018-10-14 DIAGNOSIS — Z992 Dependence on renal dialysis: Secondary | ICD-10-CM | POA: Diagnosis not present

## 2018-10-14 DIAGNOSIS — N186 End stage renal disease: Secondary | ICD-10-CM | POA: Diagnosis not present

## 2018-10-15 DIAGNOSIS — N186 End stage renal disease: Secondary | ICD-10-CM | POA: Diagnosis not present

## 2018-10-15 DIAGNOSIS — Z992 Dependence on renal dialysis: Secondary | ICD-10-CM | POA: Diagnosis not present

## 2018-10-16 DIAGNOSIS — N186 End stage renal disease: Secondary | ICD-10-CM | POA: Diagnosis not present

## 2018-10-16 DIAGNOSIS — Z992 Dependence on renal dialysis: Secondary | ICD-10-CM | POA: Diagnosis not present

## 2018-10-17 DIAGNOSIS — N186 End stage renal disease: Secondary | ICD-10-CM | POA: Diagnosis not present

## 2018-10-17 DIAGNOSIS — Z992 Dependence on renal dialysis: Secondary | ICD-10-CM | POA: Diagnosis not present

## 2018-10-18 DIAGNOSIS — Z992 Dependence on renal dialysis: Secondary | ICD-10-CM | POA: Diagnosis not present

## 2018-10-18 DIAGNOSIS — Z01818 Encounter for other preprocedural examination: Secondary | ICD-10-CM | POA: Diagnosis not present

## 2018-10-18 DIAGNOSIS — Z7984 Long term (current) use of oral hypoglycemic drugs: Secondary | ICD-10-CM | POA: Diagnosis not present

## 2018-10-18 DIAGNOSIS — N186 End stage renal disease: Secondary | ICD-10-CM | POA: Diagnosis not present

## 2018-10-18 DIAGNOSIS — E1122 Type 2 diabetes mellitus with diabetic chronic kidney disease: Secondary | ICD-10-CM | POA: Diagnosis not present

## 2018-10-19 DIAGNOSIS — N186 End stage renal disease: Secondary | ICD-10-CM | POA: Diagnosis not present

## 2018-10-19 DIAGNOSIS — Z992 Dependence on renal dialysis: Secondary | ICD-10-CM | POA: Diagnosis not present

## 2018-10-20 DIAGNOSIS — N186 End stage renal disease: Secondary | ICD-10-CM | POA: Diagnosis not present

## 2018-10-20 DIAGNOSIS — Z992 Dependence on renal dialysis: Secondary | ICD-10-CM | POA: Diagnosis not present

## 2018-10-21 DIAGNOSIS — N186 End stage renal disease: Secondary | ICD-10-CM | POA: Diagnosis not present

## 2018-10-21 DIAGNOSIS — Z992 Dependence on renal dialysis: Secondary | ICD-10-CM | POA: Diagnosis not present

## 2018-10-22 DIAGNOSIS — N186 End stage renal disease: Secondary | ICD-10-CM | POA: Diagnosis not present

## 2018-10-22 DIAGNOSIS — Z992 Dependence on renal dialysis: Secondary | ICD-10-CM | POA: Diagnosis not present

## 2018-10-23 DIAGNOSIS — Z992 Dependence on renal dialysis: Secondary | ICD-10-CM | POA: Diagnosis not present

## 2018-10-23 DIAGNOSIS — N186 End stage renal disease: Secondary | ICD-10-CM | POA: Diagnosis not present

## 2018-10-24 ENCOUNTER — Other Ambulatory Visit: Payer: Self-pay

## 2018-10-24 DIAGNOSIS — Z87891 Personal history of nicotine dependence: Secondary | ICD-10-CM | POA: Diagnosis not present

## 2018-10-24 DIAGNOSIS — E785 Hyperlipidemia, unspecified: Secondary | ICD-10-CM | POA: Diagnosis not present

## 2018-10-24 DIAGNOSIS — E119 Type 2 diabetes mellitus without complications: Secondary | ICD-10-CM | POA: Diagnosis not present

## 2018-10-24 DIAGNOSIS — N186 End stage renal disease: Secondary | ICD-10-CM | POA: Diagnosis not present

## 2018-10-24 DIAGNOSIS — Z992 Dependence on renal dialysis: Secondary | ICD-10-CM | POA: Diagnosis not present

## 2018-10-24 DIAGNOSIS — Z7682 Awaiting organ transplant status: Secondary | ICD-10-CM | POA: Diagnosis not present

## 2018-10-24 DIAGNOSIS — Z01818 Encounter for other preprocedural examination: Secondary | ICD-10-CM | POA: Diagnosis not present

## 2018-10-25 DIAGNOSIS — Z992 Dependence on renal dialysis: Secondary | ICD-10-CM | POA: Diagnosis not present

## 2018-10-25 DIAGNOSIS — N186 End stage renal disease: Secondary | ICD-10-CM | POA: Diagnosis not present

## 2018-10-26 ENCOUNTER — Ambulatory Visit: Payer: BC Managed Care – PPO | Admitting: Internal Medicine

## 2018-10-26 ENCOUNTER — Encounter: Payer: Self-pay | Admitting: Internal Medicine

## 2018-10-26 ENCOUNTER — Other Ambulatory Visit: Payer: Self-pay

## 2018-10-26 VITALS — BP 138/90 | HR 90 | Ht 71.0 in | Wt 311.0 lb

## 2018-10-26 DIAGNOSIS — N186 End stage renal disease: Secondary | ICD-10-CM | POA: Diagnosis not present

## 2018-10-26 DIAGNOSIS — E1122 Type 2 diabetes mellitus with diabetic chronic kidney disease: Secondary | ICD-10-CM | POA: Diagnosis not present

## 2018-10-26 DIAGNOSIS — E785 Hyperlipidemia, unspecified: Secondary | ICD-10-CM

## 2018-10-26 DIAGNOSIS — Z992 Dependence on renal dialysis: Secondary | ICD-10-CM | POA: Diagnosis not present

## 2018-10-26 LAB — POCT GLYCOSYLATED HEMOGLOBIN (HGB A1C): Hemoglobin A1C: 9.5 % — AB (ref 4.0–5.6)

## 2018-10-26 MED ORDER — BASAGLAR KWIKPEN 100 UNIT/ML ~~LOC~~ SOPN: PEN_INJECTOR | SUBCUTANEOUS | 3 refills | Status: DC

## 2018-10-26 MED ORDER — OZEMPIC (1 MG/DOSE) 2 MG/1.5ML ~~LOC~~ SOPN: 1.0000 mg | PEN_INJECTOR | SUBCUTANEOUS | 3 refills | Status: DC

## 2018-10-26 NOTE — Patient Instructions (Addendum)
Please  increase: - Basaglar to 28 units daily and move this to am - Ozempic to 1 mg weekly  Please return in 3 months with your sugar log.

## 2018-10-26 NOTE — Progress Notes (Signed)
Patient ID: Reginald Carpenter, male   DOB: 09/30/1965, 53 y.o.   MRN: [id number]   HPI: Reginald Carpenter is a 53 y.o.-year-old male, initially referred by his PCP, Jarrett Sohoourtney Wharton, PA, presenting for f/u for DM2, dx in ~2000, prev. insulin-dependent since 2017, uncontrolled, with complications (CKD, PN - + Charcot foot L ankle, DR). Last visit 4.5 months ago (virtual).   He was on hemodialysis at 5:45-11 am MWF, but now on peritoneal dialysis.  He has a dietary fluid restriction for 32 oz.  Last hemoglobin A1c was: Lab Results  Component Value Date   HGBA1C 7.8 (A) 03/14/2018   HGBA1C 6.8 (A) 11/10/2017   HGBA1C 6.9 (H) 11/09/2017  10/22/2016: HbA1c 9.2%  Pt is on a regimen of: - Basaglar 25 units at bedtime - Ozempic 0.5 mg weekly-started 03/2018 We stopped Amaryl in 03/2018. Stopped Metformin 1000 mg 2x a day, with meals due to poor kidney function He was previously on Amaryl 2 mg before dinner - started 10/2016, stopped 01/2014 due to lows  Pt checks his sugars 1-2 times a day: - am: 190-120s, 174 (no insulin) >> 95-140s >> 93, 115-120 >> 150-180 - 2h after b'fast: n/c  - before lunch: 150s >> 120-130 >> 120s >> 115-120 >> n/c - 2h after lunch: n/c - before dinner:150-160 >> snack: 160-210 >> 150s (no snacks) >> n/c - 2h after dinner: n/c >> 126-143 >> n/c - bedtime:150-160 >> n/c >> 150-180 >> 120-150 >> 180-230 - nighttime: n/c Lowest sugar was 90s >> 93; hypoglycemia awareness in the 60s. Highest sugar was 210 >> 210.  Glucometer: [employer]  Pt's meals are: - Breakfast: cereal; sausage and eggs - Lunch: ham and cheese sandwich, salads, pot pie - Dinner: salad, spaghetti, grilled chicken and steamed veggies - Snacks: cottage cheese He was drinking regular sodas but stopped in 2018.  -+ CKD- on peritoneal dialysis; last BUN/creatinine: 12/17/2017: 65/4.75 10/15/2017: 57/3.87, GFR 17 07/01/2017: ?/2.88, GFR 24, Protein/creatinie ratio: 13,978 05/14/2017: ?/2.42, GFR  30 10/22/2016: 44/1.84, GFR 39, glucose 182  No results found for: BUN, CREATININE  On lisinopril 20.  -+ HL; last set of lipids: 10/24/2018: 202/260/37/134 12/17/2017: 205/182/35/134 10/22/2016: 208/236/45/115 No results found for: CHOL, HDL, LDLCALC, LDLDIRECT, TRIG, CHOLHDL  On Crestor 10.  - last eye exam was in 2020: + cataracts, + DR - getting IO injections now (no records)  -+ Numbness and tingling in his feet.  He had Charcot foot surgery in 05/2017 had to have bone graft surgery.   Pt has FH of DM in mother, sister (GDM).  He also had HTN >> now improved >> stopped his anti-hypertensive medications.  She is on disability.  ROS: Constitutional: no weight gain/no weight loss, no fatigue, no subjective hyperthermia, no subjective hypothermia Eyes: no blurry vision, no xerophthalmia ENT: no sore throat, no nodules palpated in neck, no dysphagia, no odynophagia, no hoarseness Cardiovascular: no CP/no SOB/no palpitations/no leg swelling Respiratory: no cough/no SOB/no wheezing Gastrointestinal: no N/no V/no D/no C/no acid reflux Musculoskeletal: no muscle aches/no joint aches Skin: no rashes, no hair loss Neurological: no tremors/+ numbness/+ tingling/no dizziness  I reviewed pt's medications, allergies, PMH, social hx, family hx, and changes were documented in the history of present illness. Otherwise, unchanged from my initial visit note.  Past Medical History:  Diagnosis Date  . 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 Chronic kidney disease   . Diabetes mellitus without complication (HCC)    type 2  .  High cholesterol   . Hypertension   . Neuropathy    Past Surgical History:  Procedure Laterality Date  . ANKLE SURGERY     multiple ankle surgeries, with hardware and hardware removal at [facility]  . AV FISTULA PLACEMENT Right 11/09/2017   Procedure: Creation of Right Upper Arm Brachiocephalic ARTERIOVENOUS (AV) FISTULA;  Surgeon: Chuck Hintickson,  Christopher S, MD;  [facility];  Service: Vascular;  Laterality: Right;  . CARPAL TUNNEL RELEASE Bilateral   . COLONOSCOPY     Social History   Social History  . Marital status: Married    Spouse name: N/A  . Number of children: 2   Occupational History  . Out of work   Social History Main Topics  . Smoking status: Former Smoker - quit 2004    Years: 10.00  . Smokeless tobacco: Never Used  . Alcohol use No  . Drug use: No   Current Outpatient Medications on File Prior to Visit  Medication Sig Dispense Refill  . BD PEN NEEDLE NANO 2ND GEN 32G X 4 MM MISC 1 EACH BY DOES NOT APPLY ROUTE DAILY. WITH BASAGLAR PEN 100 each 11  . calcium acetate (PHOSLO) 667 MG capsule Take 667 mg by mouth 3 (three) times daily with meals.     . gabapentin (NEURONTIN) 100 MG capsule Take 100 mg by mouth 2 (two) times daily.    . Insulin Glargine (BASAGLAR KWIKPEN) 100 UNIT/ML SOPN INJECT 25 UNITS INTO THE SKIN AT BEDTIME. (Patient taking differently: Inject 25 Units into the skin at bedtime. ) 45 mL 2  . OZEMPIC, 0.25 OR 0.5 MG/DOSE, 2 MG/1.5ML SOPN INJECT 0.5 MG INTO THE SKIN ONCE A WEEK. 3 pen 2  . rosuvastatin (CRESTOR) 10 MG tablet Take 10 mg by mouth daily.    . sodium bicarbonate 650 MG tablet Take 1,300 mg by mouth 2 (two) times daily.     . Vitamin D, Ergocalciferol, (DRISDOL) 50000 units CAPS capsule Take 50,000 Units by mouth every Monday.   0   No current facility-administered medications on file prior to visit.    No Known Allergies Family History  Problem Relation Age of Onset  . Heart failure Mother    Also; HTN, HL, heart ds, lung CA in Mother  PE: BP 138/90   Pulse 90   Ht 5\' 11"  (1.803 m) Comment: measured  Wt (!) 311 lb (141.1 kg)   SpO2 97%   BMI 43.38 kg/m  Wt Readings from Last 3 Encounters:  10/26/18 (!) 311 lb (141.1 kg)  03/14/18 (!) 326 lb (147.9 kg)  12/22/17 (!) 313 lb (142 kg)   Constitutional: overweight, in NAD Eyes: PERRLA, EOMI, no  exophthalmos ENT: moist mucous membranes, no thyromegaly, no cervical lymphadenopathy Cardiovascular: RRR, No MRG Respiratory: CTA B Gastrointestinal: abdomen soft, NT, ND, BS+ Musculoskeletal: no deformities, strength intact in all 4 Skin: moist, warm, no rashes Neurological: no tremor with outstretched hands, DTR normal in all 4  ASSESSMENT: 1. DM2, insulin-dependent, uncontrolled, with complications - PN - DR - CKD with proteinuria  - sees nephrology. 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  2. HL  3.  Obesity class III  PLAN:  1. Patient with longstanding, uncontrolled, type 2 diabetes, on basal insulin and GLP-1 receptor agonist.  His sugars improved on a more plant-based diet in the past, without concentrated sweets, and we were able to stop his Amaryl.  We need to  add this back last year, when he was taken off metformin due to poor kidney function.  At last visit, he was on hemodialysis but preparing for peritoneal dialysis.  He started this.  We were again able to stop Amaryl when we started Ozempic 03/2018.  His sugars were much better at last visit with the exception of predinner sugars which are slightly high so we discussed about stopping snacking.  We did not change his regimen then. -Reviewed latest HbA1c which was higher at last visit, at 7.8% (after the holidays), increased from 6.8%. -At this visit, sugars are higher, mostly after he started peritoneal dialysis.  This is surprising and may be related to the glucose baths that he is using, but is also possible that his [place] Liter is partly dialyzed off during his 9 hours of dialysis every night.  We discussed about increasing Ozempic and Basaglar dose slightly but most importantly, moving [place] in the morning.  I did advise him that if his sugars drop too low, he may back off the [place]. - I  suggested to:  Patient Instructions  Please  increase: - Basaglar to 28 units daily and moved to a.m. - Ozempic to 1 mg weekly  Please return in 3 months with your sugar log.   - we checked his HbA1c: 9.5% (higher) - advised to check sugars at different times of the day - 1-3x a day, rotating check times - advised for yearly eye exams >> he is UTD - return to clinic in 3 months     2. HL - Reviewed latest lipid panel from 12/2017: LDL above goal, higher - Continues Crestor without side effects.  This was changed from Lipitor in 12/2017.  3.  Obesity class III -Continue Ozempic which should also help with weight loss - he had at least a 15 lbs weight loss since last visit  Philemon Kingdom, MD PhD [facility] Endocrinology

## 2018-10-27 DIAGNOSIS — N186 End stage renal disease: Secondary | ICD-10-CM | POA: Diagnosis not present

## 2018-10-27 DIAGNOSIS — Z992 Dependence on renal dialysis: Secondary | ICD-10-CM | POA: Diagnosis not present

## 2018-10-28 DIAGNOSIS — N186 End stage renal disease: Secondary | ICD-10-CM | POA: Diagnosis not present

## 2018-10-28 DIAGNOSIS — Z992 Dependence on renal dialysis: Secondary | ICD-10-CM | POA: Diagnosis not present

## 2018-10-29 DIAGNOSIS — N186 End stage renal disease: Secondary | ICD-10-CM | POA: Diagnosis not present

## 2018-10-29 DIAGNOSIS — Z992 Dependence on renal dialysis: Secondary | ICD-10-CM | POA: Diagnosis not present

## 2018-10-30 DIAGNOSIS — Z992 Dependence on renal dialysis: Secondary | ICD-10-CM | POA: Diagnosis not present

## 2018-10-30 DIAGNOSIS — N186 End stage renal disease: Secondary | ICD-10-CM | POA: Diagnosis not present

## 2018-10-31 DIAGNOSIS — N186 End stage renal disease: Secondary | ICD-10-CM | POA: Diagnosis not present

## 2018-10-31 DIAGNOSIS — Z992 Dependence on renal dialysis: Secondary | ICD-10-CM | POA: Diagnosis not present

## 2018-11-01 DIAGNOSIS — E1165 Type 2 diabetes mellitus with hyperglycemia: Secondary | ICD-10-CM | POA: Diagnosis not present

## 2018-11-01 DIAGNOSIS — M14672 Charcot's joint, left ankle and foot: Secondary | ICD-10-CM | POA: Diagnosis not present

## 2018-11-01 DIAGNOSIS — Z794 Long term (current) use of insulin: Secondary | ICD-10-CM | POA: Diagnosis not present

## 2018-11-01 DIAGNOSIS — E1142 Type 2 diabetes mellitus with diabetic polyneuropathy: Secondary | ICD-10-CM | POA: Diagnosis not present

## 2018-11-01 DIAGNOSIS — Z992 Dependence on renal dialysis: Secondary | ICD-10-CM | POA: Diagnosis not present

## 2018-11-01 DIAGNOSIS — N186 End stage renal disease: Secondary | ICD-10-CM | POA: Diagnosis not present

## 2018-11-02 DIAGNOSIS — N186 End stage renal disease: Secondary | ICD-10-CM | POA: Diagnosis not present

## 2018-11-02 DIAGNOSIS — Z992 Dependence on renal dialysis: Secondary | ICD-10-CM | POA: Diagnosis not present

## 2018-11-03 DIAGNOSIS — N186 End stage renal disease: Secondary | ICD-10-CM | POA: Diagnosis not present

## 2018-11-03 DIAGNOSIS — Z992 Dependence on renal dialysis: Secondary | ICD-10-CM | POA: Diagnosis not present

## 2018-11-04 DIAGNOSIS — Z992 Dependence on renal dialysis: Secondary | ICD-10-CM | POA: Diagnosis not present

## 2018-11-04 DIAGNOSIS — N186 End stage renal disease: Secondary | ICD-10-CM | POA: Diagnosis not present

## 2018-11-05 DIAGNOSIS — N186 End stage renal disease: Secondary | ICD-10-CM | POA: Diagnosis not present

## 2018-11-05 DIAGNOSIS — Z992 Dependence on renal dialysis: Secondary | ICD-10-CM | POA: Diagnosis not present

## 2018-11-06 DIAGNOSIS — Z992 Dependence on renal dialysis: Secondary | ICD-10-CM | POA: Diagnosis not present

## 2018-11-06 DIAGNOSIS — N186 End stage renal disease: Secondary | ICD-10-CM | POA: Diagnosis not present

## 2018-11-07 DIAGNOSIS — N186 End stage renal disease: Secondary | ICD-10-CM | POA: Diagnosis not present

## 2018-11-07 DIAGNOSIS — Z992 Dependence on renal dialysis: Secondary | ICD-10-CM | POA: Diagnosis not present

## 2018-11-08 DIAGNOSIS — Z992 Dependence on renal dialysis: Secondary | ICD-10-CM | POA: Diagnosis not present

## 2018-11-08 DIAGNOSIS — N186 End stage renal disease: Secondary | ICD-10-CM | POA: Diagnosis not present

## 2018-11-08 DIAGNOSIS — Z298 Encounter for other specified prophylactic measures: Secondary | ICD-10-CM | POA: Diagnosis not present

## 2018-11-09 DIAGNOSIS — Z992 Dependence on renal dialysis: Secondary | ICD-10-CM | POA: Diagnosis not present

## 2018-11-09 DIAGNOSIS — Z298 Encounter for other specified prophylactic measures: Secondary | ICD-10-CM | POA: Diagnosis not present

## 2018-11-09 DIAGNOSIS — N186 End stage renal disease: Secondary | ICD-10-CM | POA: Diagnosis not present

## 2018-11-10 DIAGNOSIS — Z992 Dependence on renal dialysis: Secondary | ICD-10-CM | POA: Diagnosis not present

## 2018-11-10 DIAGNOSIS — N186 End stage renal disease: Secondary | ICD-10-CM | POA: Diagnosis not present

## 2018-11-10 DIAGNOSIS — Z298 Encounter for other specified prophylactic measures: Secondary | ICD-10-CM | POA: Diagnosis not present

## 2018-11-11 DIAGNOSIS — H2513 Age-related nuclear cataract, bilateral: Secondary | ICD-10-CM | POA: Diagnosis not present

## 2018-11-11 DIAGNOSIS — H3582 Retinal ischemia: Secondary | ICD-10-CM | POA: Diagnosis not present

## 2018-11-11 DIAGNOSIS — E113413 Type 2 diabetes mellitus with severe nonproliferative diabetic retinopathy with macular edema, bilateral: Secondary | ICD-10-CM | POA: Diagnosis not present

## 2018-11-11 DIAGNOSIS — H40053 Ocular hypertension, bilateral: Secondary | ICD-10-CM | POA: Diagnosis not present

## 2018-11-11 DIAGNOSIS — Z992 Dependence on renal dialysis: Secondary | ICD-10-CM | POA: Diagnosis not present

## 2018-11-11 DIAGNOSIS — N186 End stage renal disease: Secondary | ICD-10-CM | POA: Diagnosis not present

## 2018-11-11 DIAGNOSIS — H35033 Hypertensive retinopathy, bilateral: Secondary | ICD-10-CM | POA: Diagnosis not present

## 2018-11-11 DIAGNOSIS — Z298 Encounter for other specified prophylactic measures: Secondary | ICD-10-CM | POA: Diagnosis not present

## 2018-11-12 DIAGNOSIS — Z992 Dependence on renal dialysis: Secondary | ICD-10-CM | POA: Diagnosis not present

## 2018-11-12 DIAGNOSIS — N186 End stage renal disease: Secondary | ICD-10-CM | POA: Diagnosis not present

## 2018-11-12 DIAGNOSIS — Z298 Encounter for other specified prophylactic measures: Secondary | ICD-10-CM | POA: Diagnosis not present

## 2018-11-13 DIAGNOSIS — Z298 Encounter for other specified prophylactic measures: Secondary | ICD-10-CM | POA: Diagnosis not present

## 2018-11-13 DIAGNOSIS — Z992 Dependence on renal dialysis: Secondary | ICD-10-CM | POA: Diagnosis not present

## 2018-11-13 DIAGNOSIS — N186 End stage renal disease: Secondary | ICD-10-CM | POA: Diagnosis not present

## 2018-11-14 DIAGNOSIS — Z992 Dependence on renal dialysis: Secondary | ICD-10-CM | POA: Diagnosis not present

## 2018-11-14 DIAGNOSIS — N186 End stage renal disease: Secondary | ICD-10-CM | POA: Diagnosis not present

## 2018-11-14 DIAGNOSIS — Z298 Encounter for other specified prophylactic measures: Secondary | ICD-10-CM | POA: Diagnosis not present

## 2018-11-15 DIAGNOSIS — Z298 Encounter for other specified prophylactic measures: Secondary | ICD-10-CM | POA: Diagnosis not present

## 2018-11-15 DIAGNOSIS — Z992 Dependence on renal dialysis: Secondary | ICD-10-CM | POA: Diagnosis not present

## 2018-11-15 DIAGNOSIS — N186 End stage renal disease: Secondary | ICD-10-CM | POA: Diagnosis not present

## 2018-11-16 DIAGNOSIS — Z298 Encounter for other specified prophylactic measures: Secondary | ICD-10-CM | POA: Diagnosis not present

## 2018-11-16 DIAGNOSIS — Z992 Dependence on renal dialysis: Secondary | ICD-10-CM | POA: Diagnosis not present

## 2018-11-16 DIAGNOSIS — N186 End stage renal disease: Secondary | ICD-10-CM | POA: Diagnosis not present

## 2018-11-17 DIAGNOSIS — Z992 Dependence on renal dialysis: Secondary | ICD-10-CM | POA: Diagnosis not present

## 2018-11-17 DIAGNOSIS — N186 End stage renal disease: Secondary | ICD-10-CM | POA: Diagnosis not present

## 2018-11-17 DIAGNOSIS — Z298 Encounter for other specified prophylactic measures: Secondary | ICD-10-CM | POA: Diagnosis not present

## 2018-11-18 DIAGNOSIS — Z298 Encounter for other specified prophylactic measures: Secondary | ICD-10-CM | POA: Diagnosis not present

## 2018-11-18 DIAGNOSIS — N186 End stage renal disease: Secondary | ICD-10-CM | POA: Diagnosis not present

## 2018-11-18 DIAGNOSIS — Z992 Dependence on renal dialysis: Secondary | ICD-10-CM | POA: Diagnosis not present

## 2018-11-19 DIAGNOSIS — Z992 Dependence on renal dialysis: Secondary | ICD-10-CM | POA: Diagnosis not present

## 2018-11-19 DIAGNOSIS — Z298 Encounter for other specified prophylactic measures: Secondary | ICD-10-CM | POA: Diagnosis not present

## 2018-11-19 DIAGNOSIS — N186 End stage renal disease: Secondary | ICD-10-CM | POA: Diagnosis not present

## 2018-11-20 DIAGNOSIS — Z992 Dependence on renal dialysis: Secondary | ICD-10-CM | POA: Diagnosis not present

## 2018-11-20 DIAGNOSIS — N186 End stage renal disease: Secondary | ICD-10-CM | POA: Diagnosis not present

## 2018-11-20 DIAGNOSIS — Z298 Encounter for other specified prophylactic measures: Secondary | ICD-10-CM | POA: Diagnosis not present

## 2018-11-21 DIAGNOSIS — Z298 Encounter for other specified prophylactic measures: Secondary | ICD-10-CM | POA: Diagnosis not present

## 2018-11-21 DIAGNOSIS — Z992 Dependence on renal dialysis: Secondary | ICD-10-CM | POA: Diagnosis not present

## 2018-11-21 DIAGNOSIS — N186 End stage renal disease: Secondary | ICD-10-CM | POA: Diagnosis not present

## 2018-11-22 DIAGNOSIS — Z298 Encounter for other specified prophylactic measures: Secondary | ICD-10-CM | POA: Diagnosis not present

## 2018-11-22 DIAGNOSIS — N186 End stage renal disease: Secondary | ICD-10-CM | POA: Diagnosis not present

## 2018-11-22 DIAGNOSIS — Z992 Dependence on renal dialysis: Secondary | ICD-10-CM | POA: Diagnosis not present

## 2018-11-23 DIAGNOSIS — Z298 Encounter for other specified prophylactic measures: Secondary | ICD-10-CM | POA: Diagnosis not present

## 2018-11-23 DIAGNOSIS — Z992 Dependence on renal dialysis: Secondary | ICD-10-CM | POA: Diagnosis not present

## 2018-11-23 DIAGNOSIS — N186 End stage renal disease: Secondary | ICD-10-CM | POA: Diagnosis not present

## 2018-11-24 DIAGNOSIS — Z992 Dependence on renal dialysis: Secondary | ICD-10-CM | POA: Diagnosis not present

## 2018-11-24 DIAGNOSIS — N186 End stage renal disease: Secondary | ICD-10-CM | POA: Diagnosis not present

## 2018-11-24 DIAGNOSIS — Z298 Encounter for other specified prophylactic measures: Secondary | ICD-10-CM | POA: Diagnosis not present

## 2018-11-25 DIAGNOSIS — Z298 Encounter for other specified prophylactic measures: Secondary | ICD-10-CM | POA: Diagnosis not present

## 2018-11-25 DIAGNOSIS — N186 End stage renal disease: Secondary | ICD-10-CM | POA: Diagnosis not present

## 2018-11-25 DIAGNOSIS — Z992 Dependence on renal dialysis: Secondary | ICD-10-CM | POA: Diagnosis not present

## 2018-11-26 DIAGNOSIS — Z298 Encounter for other specified prophylactic measures: Secondary | ICD-10-CM | POA: Diagnosis not present

## 2018-11-26 DIAGNOSIS — N186 End stage renal disease: Secondary | ICD-10-CM | POA: Diagnosis not present

## 2018-11-26 DIAGNOSIS — Z992 Dependence on renal dialysis: Secondary | ICD-10-CM | POA: Diagnosis not present

## 2018-11-27 DIAGNOSIS — N186 End stage renal disease: Secondary | ICD-10-CM | POA: Diagnosis not present

## 2018-11-27 DIAGNOSIS — Z298 Encounter for other specified prophylactic measures: Secondary | ICD-10-CM | POA: Diagnosis not present

## 2018-11-27 DIAGNOSIS — Z992 Dependence on renal dialysis: Secondary | ICD-10-CM | POA: Diagnosis not present

## 2018-11-27 IMAGING — US US BIOPSY
1 series · 10 of 10 positions shown · non-contrast
Comparison: none

CLINICAL DATA: Chronic kidney disease, proteinuria, nephrotic
syndrome, diabetes and hypertension. Renal biopsy requested.

[Series 1: us biopsy · 0.27mm/px · 10 of 10 slices shown]
[im 1/10]
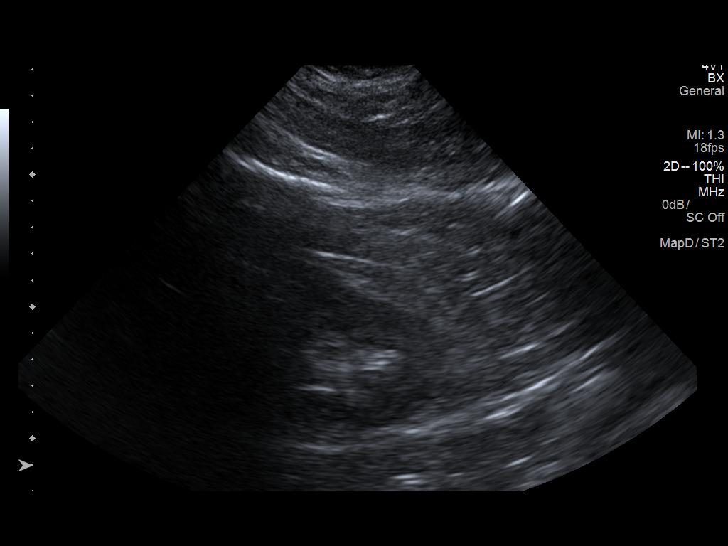
[im 2/10]
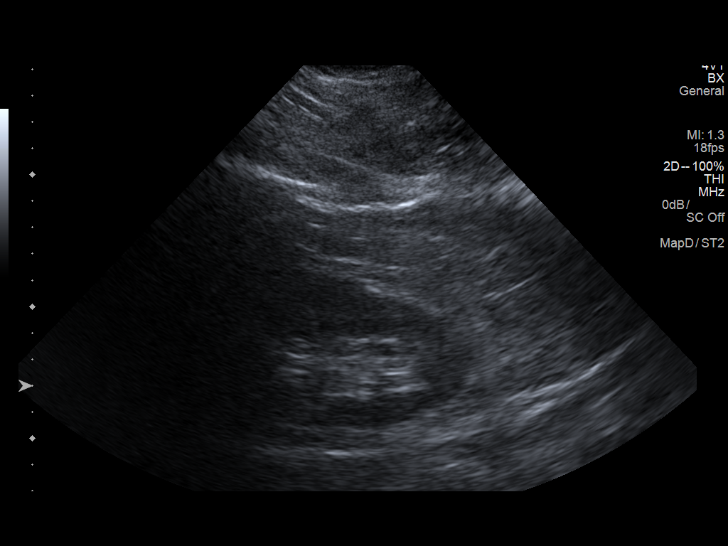
[im 3/10]
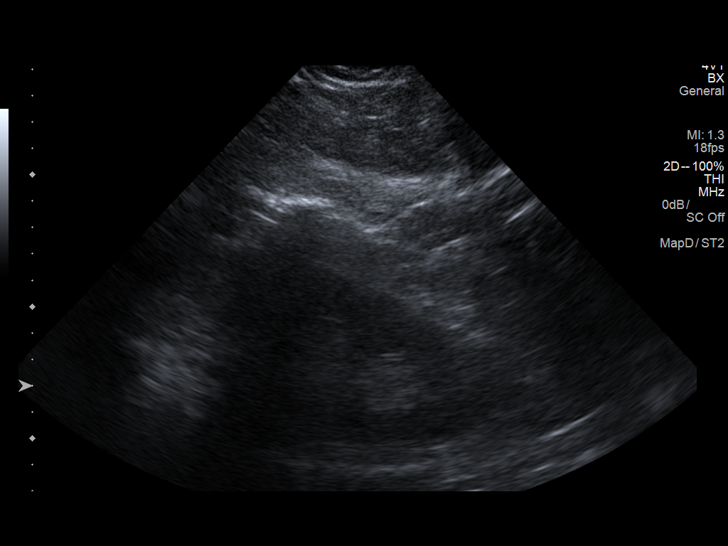
[im 4/10]
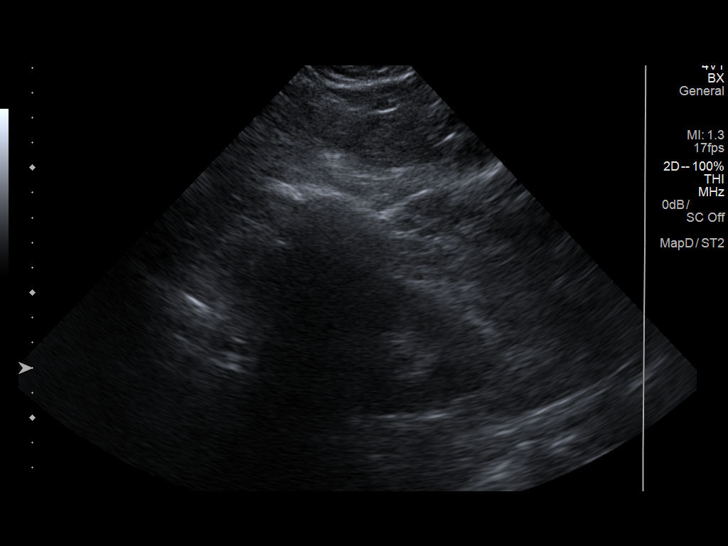
[im 5/10]
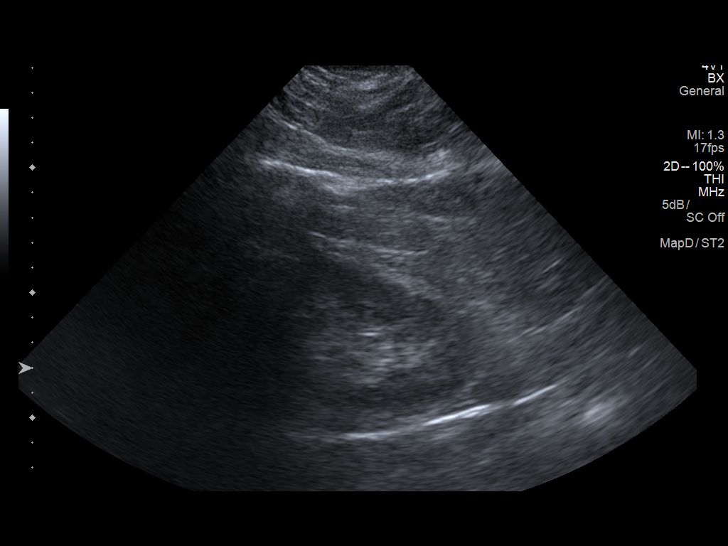
[im 6/10]
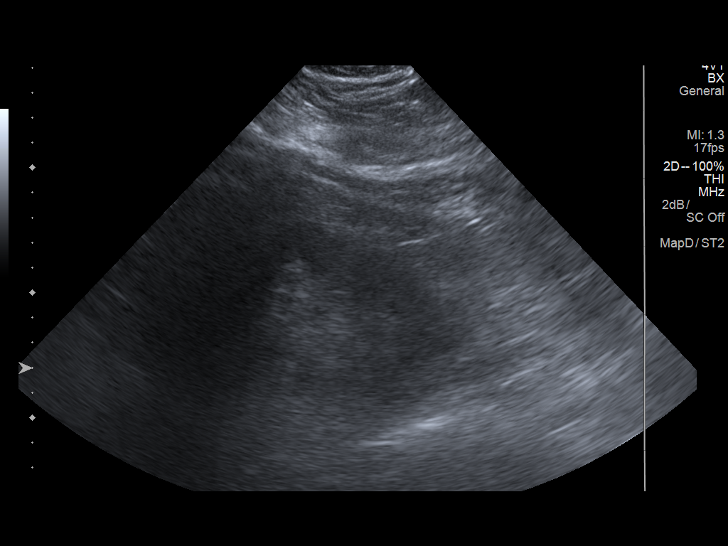
[im 7/10]
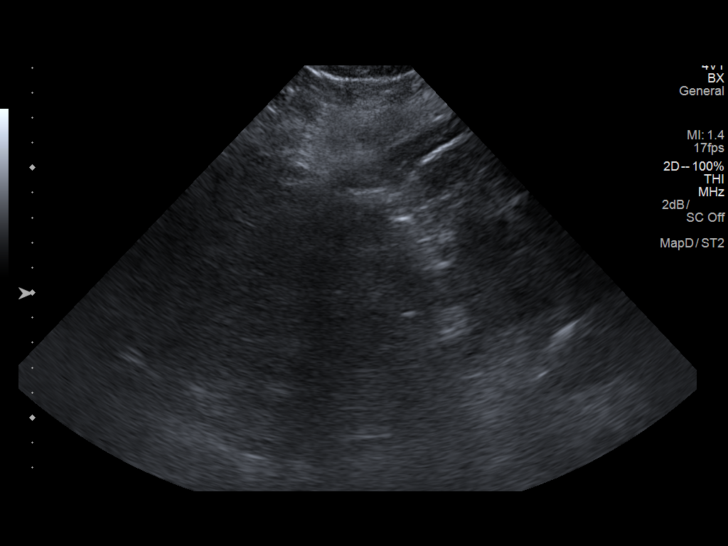
[im 8/10]
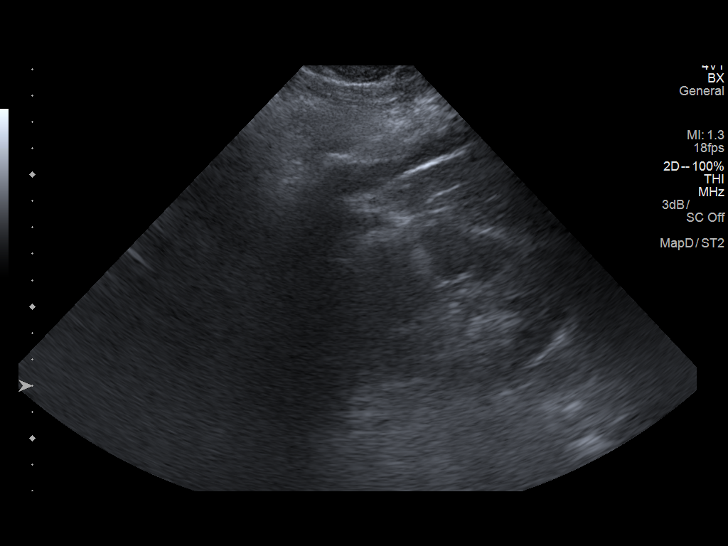
[im 9/10]
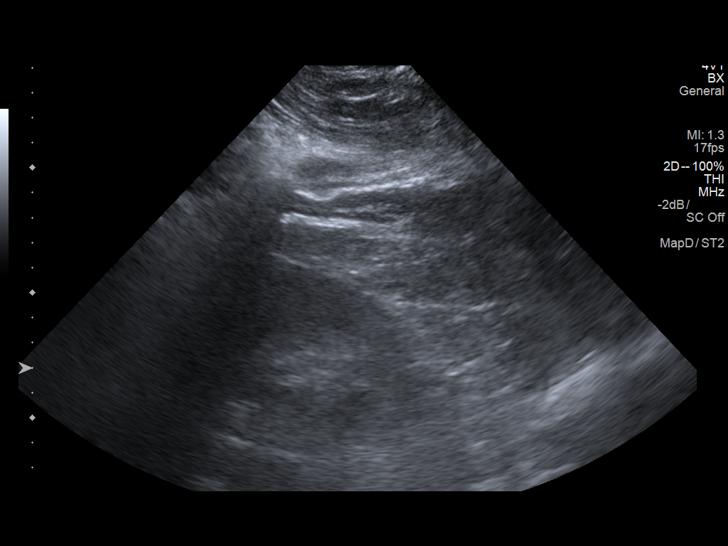
[im 10/10]
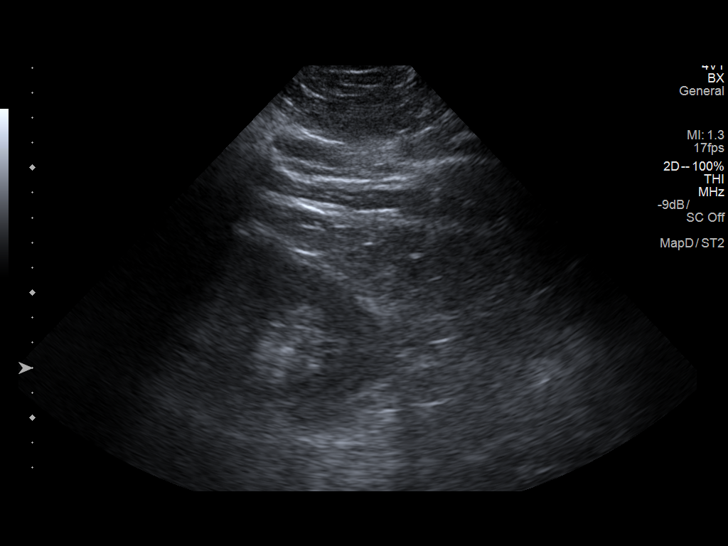

[10 of 10 positions shown; findings below may reference images not displayed]

EXAM:
ULTRASOUND GUIDED CORE BIOPSY OF RIGHT KIDNEY

MEDICATIONS:
2.0 mg IV Versed; 100 mcg IV Fentanyl

Total Moderate Sedation Time: 20 minutes.

The patient's level of consciousness and physiologic status were
continuously monitored during the procedure by Radiology nursing.

PROCEDURE:
The procedure, risks, benefits, and alternatives were explained to
the patient. Questions regarding the procedure were encouraged and
answered. The patient understands and consents to the procedure. A
time out was performed prior to initiating the procedure.

Both kidneys were examined by ultrasound from a posterior approach.
The right flank region was prepped with chlorhexidine in a sterile
fashion, and a sterile drape was applied covering the operative
field. A sterile gown and sterile gloves were used for the
procedure. Local anesthesia was provided with 1% Lidocaine.

Two separate 16 gauge core biopsy samples were obtained at the level
of right lower to mid renal cortex. Samples were submitted in
saline.

COMPLICATIONS:
None.
FINDINGS: Both kidneys were visualized by ultrasound with slightly better
visualization of the right kidney compared to the left. Solid tissue
was obtained with core biopsy.
IMPRESSION: Ultrasound-guided core biopsy performed at the level of right renal
cortex.

## 2018-11-28 DIAGNOSIS — Z992 Dependence on renal dialysis: Secondary | ICD-10-CM | POA: Diagnosis not present

## 2018-11-28 DIAGNOSIS — N186 End stage renal disease: Secondary | ICD-10-CM | POA: Diagnosis not present

## 2018-11-28 DIAGNOSIS — Z298 Encounter for other specified prophylactic measures: Secondary | ICD-10-CM | POA: Diagnosis not present

## 2018-11-29 DIAGNOSIS — N186 End stage renal disease: Secondary | ICD-10-CM | POA: Diagnosis not present

## 2018-11-29 DIAGNOSIS — Z298 Encounter for other specified prophylactic measures: Secondary | ICD-10-CM | POA: Diagnosis not present

## 2018-11-29 DIAGNOSIS — Z992 Dependence on renal dialysis: Secondary | ICD-10-CM | POA: Diagnosis not present

## 2018-11-30 DIAGNOSIS — N186 End stage renal disease: Secondary | ICD-10-CM | POA: Diagnosis not present

## 2018-11-30 DIAGNOSIS — Z992 Dependence on renal dialysis: Secondary | ICD-10-CM | POA: Diagnosis not present

## 2018-11-30 DIAGNOSIS — Z298 Encounter for other specified prophylactic measures: Secondary | ICD-10-CM | POA: Diagnosis not present

## 2018-12-01 DIAGNOSIS — Z992 Dependence on renal dialysis: Secondary | ICD-10-CM | POA: Diagnosis not present

## 2018-12-01 DIAGNOSIS — N186 End stage renal disease: Secondary | ICD-10-CM | POA: Diagnosis not present

## 2018-12-01 DIAGNOSIS — Z298 Encounter for other specified prophylactic measures: Secondary | ICD-10-CM | POA: Diagnosis not present

## 2018-12-02 DIAGNOSIS — N186 End stage renal disease: Secondary | ICD-10-CM | POA: Diagnosis not present

## 2018-12-02 DIAGNOSIS — Z298 Encounter for other specified prophylactic measures: Secondary | ICD-10-CM | POA: Diagnosis not present

## 2018-12-02 DIAGNOSIS — Z992 Dependence on renal dialysis: Secondary | ICD-10-CM | POA: Diagnosis not present

## 2018-12-03 DIAGNOSIS — Z992 Dependence on renal dialysis: Secondary | ICD-10-CM | POA: Diagnosis not present

## 2018-12-03 DIAGNOSIS — N186 End stage renal disease: Secondary | ICD-10-CM | POA: Diagnosis not present

## 2018-12-03 DIAGNOSIS — Z298 Encounter for other specified prophylactic measures: Secondary | ICD-10-CM | POA: Diagnosis not present

## 2018-12-04 DIAGNOSIS — Z298 Encounter for other specified prophylactic measures: Secondary | ICD-10-CM | POA: Diagnosis not present

## 2018-12-04 DIAGNOSIS — Z992 Dependence on renal dialysis: Secondary | ICD-10-CM | POA: Diagnosis not present

## 2018-12-04 DIAGNOSIS — N186 End stage renal disease: Secondary | ICD-10-CM | POA: Diagnosis not present

## 2018-12-05 DIAGNOSIS — Z992 Dependence on renal dialysis: Secondary | ICD-10-CM | POA: Diagnosis not present

## 2018-12-05 DIAGNOSIS — N186 End stage renal disease: Secondary | ICD-10-CM | POA: Diagnosis not present

## 2018-12-05 DIAGNOSIS — Z298 Encounter for other specified prophylactic measures: Secondary | ICD-10-CM | POA: Diagnosis not present

## 2018-12-06 DIAGNOSIS — Z992 Dependence on renal dialysis: Secondary | ICD-10-CM | POA: Diagnosis not present

## 2018-12-06 DIAGNOSIS — R000 Tachycardia, unspecified: Secondary | ICD-10-CM | POA: Diagnosis not present

## 2018-12-06 DIAGNOSIS — N186 End stage renal disease: Secondary | ICD-10-CM | POA: Diagnosis not present

## 2018-12-06 DIAGNOSIS — R072 Precordial pain: Secondary | ICD-10-CM | POA: Diagnosis not present

## 2018-12-06 DIAGNOSIS — Z87891 Personal history of nicotine dependence: Secondary | ICD-10-CM | POA: Diagnosis not present

## 2018-12-06 DIAGNOSIS — E871 Hypo-osmolality and hyponatremia: Secondary | ICD-10-CM | POA: Diagnosis not present

## 2018-12-06 DIAGNOSIS — R0789 Other chest pain: Secondary | ICD-10-CM | POA: Diagnosis not present

## 2018-12-06 DIAGNOSIS — I517 Cardiomegaly: Secondary | ICD-10-CM | POA: Diagnosis not present

## 2018-12-06 DIAGNOSIS — I248 Other forms of acute ischemic heart disease: Secondary | ICD-10-CM | POA: Diagnosis not present

## 2018-12-06 DIAGNOSIS — I120 Hypertensive chronic kidney disease with stage 5 chronic kidney disease or end stage renal disease: Secondary | ICD-10-CM | POA: Diagnosis not present

## 2018-12-06 DIAGNOSIS — Z01810 Encounter for preprocedural cardiovascular examination: Secondary | ICD-10-CM | POA: Diagnosis not present

## 2018-12-06 DIAGNOSIS — Z6841 Body Mass Index (BMI) 40.0 and over, adult: Secondary | ICD-10-CM | POA: Diagnosis not present

## 2018-12-06 DIAGNOSIS — I700 Atherosclerosis of aorta: Secondary | ICD-10-CM | POA: Diagnosis not present

## 2018-12-06 DIAGNOSIS — D631 Anemia in chronic kidney disease: Secondary | ICD-10-CM | POA: Diagnosis not present

## 2018-12-06 DIAGNOSIS — I2510 Atherosclerotic heart disease of native coronary artery without angina pectoris: Secondary | ICD-10-CM | POA: Diagnosis not present

## 2018-12-06 DIAGNOSIS — R079 Chest pain, unspecified: Secondary | ICD-10-CM | POA: Diagnosis not present

## 2018-12-06 DIAGNOSIS — E876 Hypokalemia: Secondary | ICD-10-CM | POA: Diagnosis not present

## 2018-12-06 DIAGNOSIS — E1122 Type 2 diabetes mellitus with diabetic chronic kidney disease: Secondary | ICD-10-CM | POA: Diagnosis not present

## 2018-12-06 DIAGNOSIS — R55 Syncope and collapse: Secondary | ICD-10-CM | POA: Diagnosis not present

## 2018-12-06 DIAGNOSIS — I214 Non-ST elevation (NSTEMI) myocardial infarction: Secondary | ICD-10-CM | POA: Diagnosis not present

## 2018-12-06 DIAGNOSIS — R7989 Other specified abnormal findings of blood chemistry: Secondary | ICD-10-CM | POA: Diagnosis not present

## 2018-12-06 DIAGNOSIS — I4901 Ventricular fibrillation: Secondary | ICD-10-CM | POA: Diagnosis not present

## 2018-12-06 DIAGNOSIS — Z794 Long term (current) use of insulin: Secondary | ICD-10-CM | POA: Diagnosis not present

## 2018-12-06 DIAGNOSIS — Z298 Encounter for other specified prophylactic measures: Secondary | ICD-10-CM | POA: Diagnosis not present

## 2018-12-06 DIAGNOSIS — Z809 Family history of malignant neoplasm, unspecified: Secondary | ICD-10-CM | POA: Diagnosis not present

## 2018-12-06 DIAGNOSIS — E785 Hyperlipidemia, unspecified: Secondary | ICD-10-CM | POA: Diagnosis not present

## 2018-12-06 DIAGNOSIS — Z833 Family history of diabetes mellitus: Secondary | ICD-10-CM | POA: Diagnosis not present

## 2018-12-06 DIAGNOSIS — M542 Cervicalgia: Secondary | ICD-10-CM | POA: Diagnosis not present

## 2018-12-06 DIAGNOSIS — I161 Hypertensive emergency: Secondary | ICD-10-CM | POA: Diagnosis not present

## 2018-12-06 DIAGNOSIS — R9431 Abnormal electrocardiogram [ECG] [EKG]: Secondary | ICD-10-CM | POA: Diagnosis not present

## 2018-12-07 DIAGNOSIS — R0789 Other chest pain: Secondary | ICD-10-CM | POA: Diagnosis not present

## 2018-12-07 DIAGNOSIS — I214 Non-ST elevation (NSTEMI) myocardial infarction: Secondary | ICD-10-CM | POA: Diagnosis not present

## 2018-12-07 DIAGNOSIS — R55 Syncope and collapse: Secondary | ICD-10-CM | POA: Diagnosis not present

## 2018-12-07 DIAGNOSIS — R7989 Other specified abnormal findings of blood chemistry: Secondary | ICD-10-CM | POA: Diagnosis not present

## 2018-12-07 DIAGNOSIS — I2510 Atherosclerotic heart disease of native coronary artery without angina pectoris: Secondary | ICD-10-CM | POA: Diagnosis not present

## 2018-12-07 DIAGNOSIS — I120 Hypertensive chronic kidney disease with stage 5 chronic kidney disease or end stage renal disease: Secondary | ICD-10-CM | POA: Diagnosis not present

## 2018-12-08 DIAGNOSIS — I120 Hypertensive chronic kidney disease with stage 5 chronic kidney disease or end stage renal disease: Secondary | ICD-10-CM | POA: Diagnosis not present

## 2018-12-08 DIAGNOSIS — R55 Syncope and collapse: Secondary | ICD-10-CM | POA: Diagnosis not present

## 2018-12-08 DIAGNOSIS — N186 End stage renal disease: Secondary | ICD-10-CM | POA: Diagnosis not present

## 2018-12-08 DIAGNOSIS — E1122 Type 2 diabetes mellitus with diabetic chronic kidney disease: Secondary | ICD-10-CM | POA: Diagnosis not present

## 2018-12-08 DIAGNOSIS — Z992 Dependence on renal dialysis: Secondary | ICD-10-CM | POA: Diagnosis not present

## 2018-12-10 DIAGNOSIS — N186 End stage renal disease: Secondary | ICD-10-CM | POA: Diagnosis not present

## 2018-12-10 DIAGNOSIS — Z23 Encounter for immunization: Secondary | ICD-10-CM | POA: Diagnosis not present

## 2018-12-10 DIAGNOSIS — Z992 Dependence on renal dialysis: Secondary | ICD-10-CM | POA: Diagnosis not present

## 2018-12-11 DIAGNOSIS — N186 End stage renal disease: Secondary | ICD-10-CM | POA: Diagnosis not present

## 2018-12-11 DIAGNOSIS — Z992 Dependence on renal dialysis: Secondary | ICD-10-CM | POA: Diagnosis not present

## 2018-12-11 DIAGNOSIS — Z23 Encounter for immunization: Secondary | ICD-10-CM | POA: Diagnosis not present

## 2018-12-12 DIAGNOSIS — N186 End stage renal disease: Secondary | ICD-10-CM | POA: Diagnosis not present

## 2018-12-12 DIAGNOSIS — Z23 Encounter for immunization: Secondary | ICD-10-CM | POA: Diagnosis not present

## 2018-12-12 DIAGNOSIS — Z992 Dependence on renal dialysis: Secondary | ICD-10-CM | POA: Diagnosis not present

## 2018-12-13 DIAGNOSIS — Z23 Encounter for immunization: Secondary | ICD-10-CM | POA: Diagnosis not present

## 2018-12-13 DIAGNOSIS — H2513 Age-related nuclear cataract, bilateral: Secondary | ICD-10-CM | POA: Diagnosis not present

## 2018-12-13 DIAGNOSIS — H40053 Ocular hypertension, bilateral: Secondary | ICD-10-CM | POA: Diagnosis not present

## 2018-12-13 DIAGNOSIS — H35033 Hypertensive retinopathy, bilateral: Secondary | ICD-10-CM | POA: Diagnosis not present

## 2018-12-13 DIAGNOSIS — N186 End stage renal disease: Secondary | ICD-10-CM | POA: Diagnosis not present

## 2018-12-13 DIAGNOSIS — E113413 Type 2 diabetes mellitus with severe nonproliferative diabetic retinopathy with macular edema, bilateral: Secondary | ICD-10-CM | POA: Diagnosis not present

## 2018-12-13 DIAGNOSIS — Z992 Dependence on renal dialysis: Secondary | ICD-10-CM | POA: Diagnosis not present

## 2018-12-14 DIAGNOSIS — Z23 Encounter for immunization: Secondary | ICD-10-CM | POA: Diagnosis not present

## 2018-12-14 DIAGNOSIS — Z992 Dependence on renal dialysis: Secondary | ICD-10-CM | POA: Diagnosis not present

## 2018-12-14 DIAGNOSIS — N186 End stage renal disease: Secondary | ICD-10-CM | POA: Diagnosis not present

## 2018-12-22 DIAGNOSIS — Z23 Encounter for immunization: Secondary | ICD-10-CM | POA: Diagnosis not present

## 2018-12-22 DIAGNOSIS — I10 Essential (primary) hypertension: Secondary | ICD-10-CM | POA: Diagnosis not present

## 2018-12-22 DIAGNOSIS — E782 Mixed hyperlipidemia: Secondary | ICD-10-CM | POA: Diagnosis not present

## 2018-12-22 DIAGNOSIS — E1140 Type 2 diabetes mellitus with diabetic neuropathy, unspecified: Secondary | ICD-10-CM | POA: Diagnosis not present

## 2018-12-22 DIAGNOSIS — Z992 Dependence on renal dialysis: Secondary | ICD-10-CM | POA: Diagnosis not present

## 2018-12-22 DIAGNOSIS — N186 End stage renal disease: Secondary | ICD-10-CM | POA: Diagnosis not present

## 2018-12-23 DIAGNOSIS — N186 End stage renal disease: Secondary | ICD-10-CM | POA: Diagnosis not present

## 2018-12-23 DIAGNOSIS — Z23 Encounter for immunization: Secondary | ICD-10-CM | POA: Diagnosis not present

## 2018-12-23 DIAGNOSIS — Z992 Dependence on renal dialysis: Secondary | ICD-10-CM | POA: Diagnosis not present

## 2018-12-24 DIAGNOSIS — N186 End stage renal disease: Secondary | ICD-10-CM | POA: Diagnosis not present

## 2018-12-24 DIAGNOSIS — Z23 Encounter for immunization: Secondary | ICD-10-CM | POA: Diagnosis not present

## 2018-12-24 DIAGNOSIS — Z992 Dependence on renal dialysis: Secondary | ICD-10-CM | POA: Diagnosis not present

## 2018-12-25 DIAGNOSIS — Z992 Dependence on renal dialysis: Secondary | ICD-10-CM | POA: Diagnosis not present

## 2018-12-25 DIAGNOSIS — Z23 Encounter for immunization: Secondary | ICD-10-CM | POA: Diagnosis not present

## 2018-12-25 DIAGNOSIS — N186 End stage renal disease: Secondary | ICD-10-CM | POA: Diagnosis not present

## 2018-12-26 DIAGNOSIS — Z992 Dependence on renal dialysis: Secondary | ICD-10-CM | POA: Diagnosis not present

## 2018-12-26 DIAGNOSIS — Z23 Encounter for immunization: Secondary | ICD-10-CM | POA: Diagnosis not present

## 2018-12-26 DIAGNOSIS — N186 End stage renal disease: Secondary | ICD-10-CM | POA: Diagnosis not present

## 2018-12-27 DIAGNOSIS — Z23 Encounter for immunization: Secondary | ICD-10-CM | POA: Diagnosis not present

## 2018-12-27 DIAGNOSIS — N186 End stage renal disease: Secondary | ICD-10-CM | POA: Diagnosis not present

## 2018-12-27 DIAGNOSIS — Z992 Dependence on renal dialysis: Secondary | ICD-10-CM | POA: Diagnosis not present

## 2018-12-28 DIAGNOSIS — N186 End stage renal disease: Secondary | ICD-10-CM | POA: Diagnosis not present

## 2018-12-28 DIAGNOSIS — Z992 Dependence on renal dialysis: Secondary | ICD-10-CM | POA: Diagnosis not present

## 2018-12-28 DIAGNOSIS — Z23 Encounter for immunization: Secondary | ICD-10-CM | POA: Diagnosis not present

## 2018-12-29 DIAGNOSIS — N186 End stage renal disease: Secondary | ICD-10-CM | POA: Diagnosis not present

## 2018-12-29 DIAGNOSIS — Z23 Encounter for immunization: Secondary | ICD-10-CM | POA: Diagnosis not present

## 2018-12-29 DIAGNOSIS — Z992 Dependence on renal dialysis: Secondary | ICD-10-CM | POA: Diagnosis not present

## 2018-12-30 DIAGNOSIS — Z992 Dependence on renal dialysis: Secondary | ICD-10-CM | POA: Diagnosis not present

## 2018-12-30 DIAGNOSIS — Z23 Encounter for immunization: Secondary | ICD-10-CM | POA: Diagnosis not present

## 2018-12-30 DIAGNOSIS — N186 End stage renal disease: Secondary | ICD-10-CM | POA: Diagnosis not present

## 2018-12-31 DIAGNOSIS — Z23 Encounter for immunization: Secondary | ICD-10-CM | POA: Diagnosis not present

## 2018-12-31 DIAGNOSIS — Z992 Dependence on renal dialysis: Secondary | ICD-10-CM | POA: Diagnosis not present

## 2018-12-31 DIAGNOSIS — N186 End stage renal disease: Secondary | ICD-10-CM | POA: Diagnosis not present

## 2019-01-01 DIAGNOSIS — Z992 Dependence on renal dialysis: Secondary | ICD-10-CM | POA: Diagnosis not present

## 2019-01-01 DIAGNOSIS — Z23 Encounter for immunization: Secondary | ICD-10-CM | POA: Diagnosis not present

## 2019-01-01 DIAGNOSIS — N186 End stage renal disease: Secondary | ICD-10-CM | POA: Diagnosis not present

## 2019-01-02 DIAGNOSIS — Z23 Encounter for immunization: Secondary | ICD-10-CM | POA: Diagnosis not present

## 2019-01-02 DIAGNOSIS — N186 End stage renal disease: Secondary | ICD-10-CM | POA: Diagnosis not present

## 2019-01-02 DIAGNOSIS — Z992 Dependence on renal dialysis: Secondary | ICD-10-CM | POA: Diagnosis not present

## 2019-01-03 DIAGNOSIS — Z992 Dependence on renal dialysis: Secondary | ICD-10-CM | POA: Diagnosis not present

## 2019-01-03 DIAGNOSIS — N186 End stage renal disease: Secondary | ICD-10-CM | POA: Diagnosis not present

## 2019-01-03 DIAGNOSIS — Z23 Encounter for immunization: Secondary | ICD-10-CM | POA: Diagnosis not present

## 2019-01-04 DIAGNOSIS — Z23 Encounter for immunization: Secondary | ICD-10-CM | POA: Diagnosis not present

## 2019-01-04 DIAGNOSIS — N186 End stage renal disease: Secondary | ICD-10-CM | POA: Diagnosis not present

## 2019-01-04 DIAGNOSIS — Z992 Dependence on renal dialysis: Secondary | ICD-10-CM | POA: Diagnosis not present

## 2019-01-05 DIAGNOSIS — Z23 Encounter for immunization: Secondary | ICD-10-CM | POA: Diagnosis not present

## 2019-01-05 DIAGNOSIS — Z992 Dependence on renal dialysis: Secondary | ICD-10-CM | POA: Diagnosis not present

## 2019-01-05 DIAGNOSIS — N186 End stage renal disease: Secondary | ICD-10-CM | POA: Diagnosis not present

## 2019-01-06 DIAGNOSIS — Z23 Encounter for immunization: Secondary | ICD-10-CM | POA: Diagnosis not present

## 2019-01-06 DIAGNOSIS — Z992 Dependence on renal dialysis: Secondary | ICD-10-CM | POA: Diagnosis not present

## 2019-01-06 DIAGNOSIS — N186 End stage renal disease: Secondary | ICD-10-CM | POA: Diagnosis not present

## 2019-01-07 DIAGNOSIS — Z23 Encounter for immunization: Secondary | ICD-10-CM | POA: Diagnosis not present

## 2019-01-07 DIAGNOSIS — N186 End stage renal disease: Secondary | ICD-10-CM | POA: Diagnosis not present

## 2019-01-07 DIAGNOSIS — Z992 Dependence on renal dialysis: Secondary | ICD-10-CM | POA: Diagnosis not present

## 2019-01-08 DIAGNOSIS — N186 End stage renal disease: Secondary | ICD-10-CM | POA: Diagnosis not present

## 2019-01-08 DIAGNOSIS — Z992 Dependence on renal dialysis: Secondary | ICD-10-CM | POA: Diagnosis not present

## 2019-01-09 DIAGNOSIS — Z992 Dependence on renal dialysis: Secondary | ICD-10-CM | POA: Diagnosis not present

## 2019-01-09 DIAGNOSIS — N186 End stage renal disease: Secondary | ICD-10-CM | POA: Diagnosis not present

## 2019-01-10 DIAGNOSIS — N186 End stage renal disease: Secondary | ICD-10-CM | POA: Diagnosis not present

## 2019-01-10 DIAGNOSIS — Z992 Dependence on renal dialysis: Secondary | ICD-10-CM | POA: Diagnosis not present

## 2019-01-10 DIAGNOSIS — N2581 Secondary hyperparathyroidism of renal origin: Secondary | ICD-10-CM | POA: Diagnosis not present

## 2019-01-10 DIAGNOSIS — E1122 Type 2 diabetes mellitus with diabetic chronic kidney disease: Secondary | ICD-10-CM | POA: Diagnosis not present

## 2019-01-10 DIAGNOSIS — D631 Anemia in chronic kidney disease: Secondary | ICD-10-CM | POA: Diagnosis not present

## 2019-01-10 DIAGNOSIS — E8779 Other fluid overload: Secondary | ICD-10-CM | POA: Diagnosis not present

## 2019-01-10 DIAGNOSIS — Z114 Encounter for screening for human immunodeficiency virus [HIV]: Secondary | ICD-10-CM | POA: Diagnosis not present

## 2019-01-10 DIAGNOSIS — Z1159 Encounter for screening for other viral diseases: Secondary | ICD-10-CM | POA: Diagnosis not present

## 2019-01-10 DIAGNOSIS — D509 Iron deficiency anemia, unspecified: Secondary | ICD-10-CM | POA: Diagnosis not present

## 2019-01-12 DIAGNOSIS — N2581 Secondary hyperparathyroidism of renal origin: Secondary | ICD-10-CM | POA: Diagnosis not present

## 2019-01-12 DIAGNOSIS — N186 End stage renal disease: Secondary | ICD-10-CM | POA: Diagnosis not present

## 2019-01-12 DIAGNOSIS — E8779 Other fluid overload: Secondary | ICD-10-CM | POA: Diagnosis not present

## 2019-01-12 DIAGNOSIS — D509 Iron deficiency anemia, unspecified: Secondary | ICD-10-CM | POA: Diagnosis not present

## 2019-01-12 DIAGNOSIS — D631 Anemia in chronic kidney disease: Secondary | ICD-10-CM | POA: Diagnosis not present

## 2019-01-13 DIAGNOSIS — Z01818 Encounter for other preprocedural examination: Secondary | ICD-10-CM | POA: Diagnosis not present

## 2019-01-14 DIAGNOSIS — N2581 Secondary hyperparathyroidism of renal origin: Secondary | ICD-10-CM | POA: Diagnosis not present

## 2019-01-14 DIAGNOSIS — D509 Iron deficiency anemia, unspecified: Secondary | ICD-10-CM | POA: Diagnosis not present

## 2019-01-14 DIAGNOSIS — N186 End stage renal disease: Secondary | ICD-10-CM | POA: Diagnosis not present

## 2019-01-14 DIAGNOSIS — D631 Anemia in chronic kidney disease: Secondary | ICD-10-CM | POA: Diagnosis not present

## 2019-01-14 DIAGNOSIS — E8779 Other fluid overload: Secondary | ICD-10-CM | POA: Diagnosis not present

## 2019-01-16 DIAGNOSIS — N186 End stage renal disease: Secondary | ICD-10-CM | POA: Diagnosis not present

## 2019-01-16 DIAGNOSIS — D631 Anemia in chronic kidney disease: Secondary | ICD-10-CM | POA: Diagnosis not present

## 2019-01-16 DIAGNOSIS — D509 Iron deficiency anemia, unspecified: Secondary | ICD-10-CM | POA: Diagnosis not present

## 2019-01-16 DIAGNOSIS — E8779 Other fluid overload: Secondary | ICD-10-CM | POA: Diagnosis not present

## 2019-01-16 DIAGNOSIS — N2581 Secondary hyperparathyroidism of renal origin: Secondary | ICD-10-CM | POA: Diagnosis not present

## 2019-01-19 DIAGNOSIS — N186 End stage renal disease: Secondary | ICD-10-CM | POA: Diagnosis not present

## 2019-01-19 DIAGNOSIS — D631 Anemia in chronic kidney disease: Secondary | ICD-10-CM | POA: Diagnosis not present

## 2019-01-19 DIAGNOSIS — E8779 Other fluid overload: Secondary | ICD-10-CM | POA: Diagnosis not present

## 2019-01-19 DIAGNOSIS — N2581 Secondary hyperparathyroidism of renal origin: Secondary | ICD-10-CM | POA: Diagnosis not present

## 2019-01-19 DIAGNOSIS — D509 Iron deficiency anemia, unspecified: Secondary | ICD-10-CM | POA: Diagnosis not present

## 2019-01-20 DIAGNOSIS — Z01818 Encounter for other preprocedural examination: Secondary | ICD-10-CM | POA: Diagnosis not present

## 2019-01-21 DIAGNOSIS — N2581 Secondary hyperparathyroidism of renal origin: Secondary | ICD-10-CM | POA: Diagnosis not present

## 2019-01-21 DIAGNOSIS — D631 Anemia in chronic kidney disease: Secondary | ICD-10-CM | POA: Diagnosis not present

## 2019-01-21 DIAGNOSIS — E8779 Other fluid overload: Secondary | ICD-10-CM | POA: Diagnosis not present

## 2019-01-21 DIAGNOSIS — D509 Iron deficiency anemia, unspecified: Secondary | ICD-10-CM | POA: Diagnosis not present

## 2019-01-21 DIAGNOSIS — N186 End stage renal disease: Secondary | ICD-10-CM | POA: Diagnosis not present

## 2019-01-23 DIAGNOSIS — D509 Iron deficiency anemia, unspecified: Secondary | ICD-10-CM | POA: Diagnosis not present

## 2019-01-23 DIAGNOSIS — D631 Anemia in chronic kidney disease: Secondary | ICD-10-CM | POA: Diagnosis not present

## 2019-01-23 DIAGNOSIS — N2581 Secondary hyperparathyroidism of renal origin: Secondary | ICD-10-CM | POA: Diagnosis not present

## 2019-01-23 DIAGNOSIS — N186 End stage renal disease: Secondary | ICD-10-CM | POA: Diagnosis not present

## 2019-01-23 DIAGNOSIS — E8779 Other fluid overload: Secondary | ICD-10-CM | POA: Diagnosis not present

## 2019-01-24 DIAGNOSIS — Z833 Family history of diabetes mellitus: Secondary | ICD-10-CM | POA: Diagnosis not present

## 2019-01-24 DIAGNOSIS — M1990 Unspecified osteoarthritis, unspecified site: Secondary | ICD-10-CM | POA: Diagnosis not present

## 2019-01-24 DIAGNOSIS — H40053 Ocular hypertension, bilateral: Secondary | ICD-10-CM | POA: Diagnosis not present

## 2019-01-24 DIAGNOSIS — K760 Fatty (change of) liver, not elsewhere classified: Secondary | ICD-10-CM | POA: Diagnosis not present

## 2019-01-24 DIAGNOSIS — E1140 Type 2 diabetes mellitus with diabetic neuropathy, unspecified: Secondary | ICD-10-CM | POA: Diagnosis not present

## 2019-01-24 DIAGNOSIS — E1136 Type 2 diabetes mellitus with diabetic cataract: Secondary | ICD-10-CM | POA: Diagnosis not present

## 2019-01-24 DIAGNOSIS — Z6841 Body Mass Index (BMI) 40.0 and over, adult: Secondary | ICD-10-CM | POA: Diagnosis not present

## 2019-01-24 DIAGNOSIS — H25811 Combined forms of age-related cataract, right eye: Secondary | ICD-10-CM | POA: Diagnosis not present

## 2019-01-24 DIAGNOSIS — E1122 Type 2 diabetes mellitus with diabetic chronic kidney disease: Secondary | ICD-10-CM | POA: Diagnosis not present

## 2019-01-24 DIAGNOSIS — E785 Hyperlipidemia, unspecified: Secondary | ICD-10-CM | POA: Diagnosis not present

## 2019-01-24 DIAGNOSIS — H25813 Combined forms of age-related cataract, bilateral: Secondary | ICD-10-CM | POA: Diagnosis not present

## 2019-01-24 DIAGNOSIS — I129 Hypertensive chronic kidney disease with stage 1 through stage 4 chronic kidney disease, or unspecified chronic kidney disease: Secondary | ICD-10-CM | POA: Diagnosis not present

## 2019-01-24 DIAGNOSIS — Z794 Long term (current) use of insulin: Secondary | ICD-10-CM | POA: Diagnosis not present

## 2019-01-24 DIAGNOSIS — H2511 Age-related nuclear cataract, right eye: Secondary | ICD-10-CM | POA: Diagnosis not present

## 2019-01-24 DIAGNOSIS — Z8249 Family history of ischemic heart disease and other diseases of the circulatory system: Secondary | ICD-10-CM | POA: Diagnosis not present

## 2019-01-24 DIAGNOSIS — Z87891 Personal history of nicotine dependence: Secondary | ICD-10-CM | POA: Diagnosis not present

## 2019-01-24 DIAGNOSIS — N183 Chronic kidney disease, stage 3 unspecified: Secondary | ICD-10-CM | POA: Diagnosis not present

## 2019-01-26 DIAGNOSIS — D631 Anemia in chronic kidney disease: Secondary | ICD-10-CM | POA: Diagnosis not present

## 2019-01-26 DIAGNOSIS — N2581 Secondary hyperparathyroidism of renal origin: Secondary | ICD-10-CM | POA: Diagnosis not present

## 2019-01-26 DIAGNOSIS — E8779 Other fluid overload: Secondary | ICD-10-CM | POA: Diagnosis not present

## 2019-01-26 DIAGNOSIS — N186 End stage renal disease: Secondary | ICD-10-CM | POA: Diagnosis not present

## 2019-01-26 DIAGNOSIS — D509 Iron deficiency anemia, unspecified: Secondary | ICD-10-CM | POA: Diagnosis not present

## 2019-01-27 ENCOUNTER — Ambulatory Visit (INDEPENDENT_AMBULATORY_CARE_PROVIDER_SITE_OTHER): Payer: BC Managed Care – PPO | Admitting: Internal Medicine

## 2019-01-27 ENCOUNTER — Encounter: Payer: Self-pay | Admitting: Internal Medicine

## 2019-01-27 ENCOUNTER — Other Ambulatory Visit: Payer: Self-pay

## 2019-01-27 VITALS — BP 130/80 | HR 80 | Ht 71.0 in | Wt 310.0 lb

## 2019-01-27 DIAGNOSIS — E1122 Type 2 diabetes mellitus with diabetic chronic kidney disease: Secondary | ICD-10-CM

## 2019-01-27 DIAGNOSIS — E785 Hyperlipidemia, unspecified: Secondary | ICD-10-CM

## 2019-01-27 DIAGNOSIS — H2512 Age-related nuclear cataract, left eye: Secondary | ICD-10-CM | POA: Diagnosis not present

## 2019-01-27 DIAGNOSIS — E113413 Type 2 diabetes mellitus with severe nonproliferative diabetic retinopathy with macular edema, bilateral: Secondary | ICD-10-CM | POA: Diagnosis not present

## 2019-01-27 LAB — POCT GLYCOSYLATED HEMOGLOBIN (HGB A1C): Hemoglobin A1C: 9.5 % — AB (ref 4.0–5.6)

## 2019-01-27 NOTE — Patient Instructions (Signed)
Please continue: - Ozempic 1 mg weekly  Increase: - Basaglar to 36 units and move this to evening  Please get in touch with me in 1 month about the sugars.  Please come back for a follow-up appointment in 3 months.

## 2019-01-27 NOTE — Progress Notes (Signed)
Patient ID: Reginald Carpenter, male   DOB: 1965-04-23, 53 y.o.   MRN: [id number]   This visit occurred during the SARS-CoV-2 public health emergency.  Safety protocols were in place, including screening questions prior to the visit, additional usage of staff PPE, and extensive cleaning of exam room while observing appropriate contact time as indicated for disinfecting solutions.   HPI: Reginald Carpenter is a 53 y.o.-year-old male, initially referred by his PCP, Jarrett Soho, PA, presenting for f/u for DM2, dx in ~2000, prev. insulin-dependent since 2017, uncontrolled, with complications (CKD, PN - + Charcot foot L ankle, DR). Last visit 3 months ago.   He was on hemodialysis at 5:45-11 am MWF, but switched to peritoneal dialysis before our last visit.   He now just switched back on HD (TTS, at 6:30 am) 1 week ago 2/2 pelvic pain during peritoneal dialysis.  Reviewed HbA1c levels: Lab Results  Component Value Date   HGBA1C 9.5 (A) 10/26/2018   HGBA1C 7.8 (A) 03/14/2018   HGBA1C 6.8 (A) 11/10/2017  10/22/2016: HbA1c 9.2%  Pt is on a regimen of: - Basaglar 25 >> 28 units at bedtime >> in am - Ozempic 0.5 mg weekly-started 03/2018 >> 1 mg weekly We stopped Amaryl in 03/2018. Stopped Metformin 1000 mg 2x a day, with meals due to poor kidney function He was previously on Amaryl 2 mg before dinner - started 10/2016, stopped 01/2014 due to lows  Pt checks his sugars 1-2 times a day: - am: 95-140s >> 93, 115-120 >> 150-180 >> 200-230 - 2h after b'fast: n/c  - before lunch:120-130 >> 120s >> 115-120 >> n/c >> 180-210 - 2h after lunch: n/c - before dinner: 160-210 >> 150s (no snacks) >> n/c  - 2h after dinner: n/c >> 126-143 >> n/c - bedtime: 150-180 >> 120-150 >> 180-230 >> 180-200, 310 - nighttime: n/c Lowest sugar was 90s >> 93 >> 130; hypoglycemia awareness in the 60s. Highest sugar was 210 >> 210 >> 310.  Glucometer: [employer]  Pt's meals are: - Breakfast: cereal; sausage and eggs - Lunch:  ham and cheese sandwich, salads, pot pie - Dinner: salad, spaghetti, grilled chicken and steamed veggies - Snacks: cottage cheese He was drinking regular sodas but stopped in 2018.  -+ ESRD. 10/24/2018: 38/1.0 12/17/2017: 65/4.75 10/15/2017: 57/3.87, GFR 17 07/01/2017: ?/2.88, GFR 24, Protein/creatinie ratio: 13,978 05/14/2017: ?/2.42, GFR 30 10/22/2016: 44/1.84, GFR 39, glucose 182  No results found for: BUN, CREATININE  On lisinopril 20.  -+ HL; last set of lipids: 10/24/2018: 202/260/37/134 12/17/2017: 205/182/35/134 10/22/2016: 208/236/45/115 No results found for: CHOL, HDL, LDLCALC, LDLDIRECT, TRIG, CHOLHDL  On Crestor 10.  - last eye exam was in 2020: + Cataracts, + DR-getting IO injections. He had cataract Sx on the R eye last week, and will have sx on L eye next mo.  -+ Numbness and tingling in his feet.  He had Charcot foot surgery in 05/2017 had to have bone graft surgery.   Pt has FH of DM in mother, sister (GDM).  She also has a history of hypertension, but this improves that he could stop his antihypertensive medications  She is on disability.  ROS: Constitutional: no weight gain/no weight loss, no fatigue, no subjective hyperthermia, no subjective hypothermia Eyes: no blurry vision, no xerophthalmia ENT: no sore throat, no nodules palpated in neck, no dysphagia, no odynophagia, no hoarseness Cardiovascular: no CP/no SOB/no palpitations/no leg swelling Respiratory: no cough/no SOB/no wheezing Gastrointestinal: no N/no V/no D/no C/no acid reflux Musculoskeletal: no muscle aches/no  joint aches, + pain in L foot - Charcot foot Skin: no rashes, no hair loss Neurological: no tremors/+ numbness/+ tingling/no dizziness  I reviewed pt's medications, allergies, PMH, social hx, family hx, and changes were documented in the history of present illness. Otherwise, unchanged from my initial visit note.  Past Medical History:  Diagnosis Date  . 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 Chronic kidney disease   . Diabetes mellitus without complication ([place])    type 2  . High cholesterol   . Hypertension   . Neuropathy    Past Surgical History:  Procedure Laterality Date  . ANKLE SURGERY     multiple ankle surgeries, with hardware and hardware removal at [facility]  . AV FISTULA PLACEMENT Right 11/09/2017   Procedure: Creation of Right Upper Arm Brachiocephalic ARTERIOVENOUS (AV) FISTULA;  Surgeon: Angelia Mould, MD;  Location: [facility];  Service: Vascular;  Laterality: Right;  . CARPAL TUNNEL RELEASE Bilateral   . COLONOSCOPY     Social History   Social History  . Marital status: Married    Spouse name: N/A  . Number of children: 2   Occupational History  . Out of work   Social History Main Topics  . Smoking status: Former Smoker - quit 2004    Years: 10.00  . Smokeless tobacco: Never Used  . Alcohol use No  . Drug use: No   Current Outpatient Medications on File Prior to Visit  Medication Sig Dispense Refill  . BD PEN NEEDLE NANO 2ND GEN 32G X 4 MM MISC 1 EACH BY DOES NOT APPLY ROUTE DAILY. WITH BASAGLAR PEN 100 each 11  . calcium acetate (PHOSLO) 667 MG capsule Take 667 mg by mouth 3 (three) times daily with meals.     . gabapentin (NEURONTIN) 100 MG capsule Take 100 mg by mouth 2 (two) times daily.    . Insulin Glargine (BASAGLAR KWIKPEN) 100 UNIT/ML SOPN INJECT 28 UNITS INTO THE SKIN AT BEDTIME. 45 mL 3  . rosuvastatin (CRESTOR) 10 MG tablet Take 10 mg by mouth daily.    . Semaglutide, 1 MG/DOSE, (OZEMPIC, 1 MG/DOSE,) 2 MG/1.5ML SOPN Inject 1 mg into the skin once a week. 6 pen 3  . sodium bicarbonate 650 MG tablet Take 1,300 mg by mouth 2 (two) times daily.     . Vitamin D, Ergocalciferol, (DRISDOL) 50000 units CAPS capsule Take 50,000 Units by mouth every Monday.   0   No current facility-administered medications on file prior to visit.    No Known Allergies Family History  Problem Relation Age of Onset  .  Heart failure Mother    Also; HTN, HL, heart ds, lung CA in Mother  PE: BP 130/80   Pulse 80   Ht 5\' 11"  (1.803 m)   Wt (!) 310 lb (140.6 kg)   SpO2 98%   BMI 43.24 kg/m  Wt Readings from Last 3 Encounters:  01/27/19 (!) 310 lb (140.6 kg)  10/26/18 (!) 311 lb (141.1 kg)  03/14/18 (!) 326 lb (147.9 kg)   Constitutional: overweight, in NAD Eyes: PERRLA, EOMI, no exophthalmos ENT: moist mucous membranes, no thyromegaly, no cervical lymphadenopathy Cardiovascular: RRR, No MRG Respiratory: CTA B Gastrointestinal: abdomen soft, NT, ND, BS+ Musculoskeletal: L foot Charcot deformity  - L foot in boot, strength intact in all 4 Skin: moist, warm, no rashes Neurological: no tremor with outstretched hands, DTR normal in all 4  ASSESSMENT: 1. DM2, insulin-dependent, uncontrolled,  with complications - PN - DR - CKD with proteinuria  - sees nephrology. 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  2. HL  3.  Obesity class III  PLAN:  1. Patient with longstanding, uncontrolled, type 2 diabetes, on basal insulin and GLP-1 receptor agonist.  His sugars improved on a more plant-based diet in the past, without concentrated sweets and we were able to stop his Amaryl. -At last visit, sugars are higher, mostly after he started peritoneal dialysis.  We discussed that this may be related to the glucose tabs that he was using, but it was also possible that his Basaglar was partly dialyzed off during his 9 hours of dialysis every night.  We increased Ozempic and Basaglar dose but we also moved Basaglar in the morning.  At that time, HbA1c was higher, at 9.5% - at this visit, sugars are still high, even higher than before. Upon Q'ing, he is injecting Ozempic and Basaglar both in shoulder >> advised to move them to abdomen and how to rotate sites - also, since he restarted HD,  we need to move Basaglar back to evening as to not be dialyzed off; we will also increase the dose. If sugars do not improve in ~ 4 weeks, will need addition of rapid acting insulin - [facility]  - need to lose 40 lbs in preparation for renal transplant >> discussed that we may need to decrease his insulin dose then - I suggested to:  Patient Instructions  Please continue: - Ozempic 1 mg weekly  Increase: - Basaglar to 36 units and move this to evening  Please get in touch with me in 1 month about the sugars.  Please come back for a follow-up appointment in 3 months.  - we checked his HbA1c: 9.5% (stable) - advised to check sugars at different times of the day - 1-2x a day, rotating check times - advised for yearly eye exams >> he is UTD - return to clinic in 3-4 months    2. HL -Reviewed latest lipid panel from 10/2018: LDL above goal, triglycerides also high -Continues Crestor without side effects.  This was changed from Lipitor after the above results returned.  3.  Obesity class III -Continue Ozempic which should also help with weight loss -Before last visit, he lost 16 pounds, now stable weight  Carlus Pavlovristina Gerry Heaphy, MD PhD [facility]

## 2019-01-29 DIAGNOSIS — N2581 Secondary hyperparathyroidism of renal origin: Secondary | ICD-10-CM | POA: Diagnosis not present

## 2019-01-29 DIAGNOSIS — N186 End stage renal disease: Secondary | ICD-10-CM | POA: Diagnosis not present

## 2019-01-29 DIAGNOSIS — E8779 Other fluid overload: Secondary | ICD-10-CM | POA: Diagnosis not present

## 2019-01-29 DIAGNOSIS — D509 Iron deficiency anemia, unspecified: Secondary | ICD-10-CM | POA: Diagnosis not present

## 2019-01-29 DIAGNOSIS — D631 Anemia in chronic kidney disease: Secondary | ICD-10-CM | POA: Diagnosis not present

## 2019-02-01 DIAGNOSIS — E8779 Other fluid overload: Secondary | ICD-10-CM | POA: Diagnosis not present

## 2019-02-01 DIAGNOSIS — D631 Anemia in chronic kidney disease: Secondary | ICD-10-CM | POA: Diagnosis not present

## 2019-02-01 DIAGNOSIS — N2581 Secondary hyperparathyroidism of renal origin: Secondary | ICD-10-CM | POA: Diagnosis not present

## 2019-02-01 DIAGNOSIS — N186 End stage renal disease: Secondary | ICD-10-CM | POA: Diagnosis not present

## 2019-02-01 DIAGNOSIS — D509 Iron deficiency anemia, unspecified: Secondary | ICD-10-CM | POA: Diagnosis not present

## 2019-02-03 DIAGNOSIS — N186 End stage renal disease: Secondary | ICD-10-CM | POA: Diagnosis not present

## 2019-02-03 DIAGNOSIS — N2581 Secondary hyperparathyroidism of renal origin: Secondary | ICD-10-CM | POA: Diagnosis not present

## 2019-02-03 DIAGNOSIS — E8779 Other fluid overload: Secondary | ICD-10-CM | POA: Diagnosis not present

## 2019-02-03 DIAGNOSIS — D509 Iron deficiency anemia, unspecified: Secondary | ICD-10-CM | POA: Diagnosis not present

## 2019-02-03 DIAGNOSIS — D631 Anemia in chronic kidney disease: Secondary | ICD-10-CM | POA: Diagnosis not present

## 2019-02-06 DIAGNOSIS — Z992 Dependence on renal dialysis: Secondary | ICD-10-CM | POA: Diagnosis not present

## 2019-02-06 DIAGNOSIS — D631 Anemia in chronic kidney disease: Secondary | ICD-10-CM | POA: Diagnosis not present

## 2019-02-06 DIAGNOSIS — N2581 Secondary hyperparathyroidism of renal origin: Secondary | ICD-10-CM | POA: Diagnosis not present

## 2019-02-06 DIAGNOSIS — N186 End stage renal disease: Secondary | ICD-10-CM | POA: Diagnosis not present

## 2019-02-06 DIAGNOSIS — E8779 Other fluid overload: Secondary | ICD-10-CM | POA: Diagnosis not present

## 2019-02-06 DIAGNOSIS — D509 Iron deficiency anemia, unspecified: Secondary | ICD-10-CM | POA: Diagnosis not present

## 2019-02-06 DIAGNOSIS — Z01818 Encounter for other preprocedural examination: Secondary | ICD-10-CM | POA: Diagnosis not present

## 2019-02-07 DIAGNOSIS — N186 End stage renal disease: Secondary | ICD-10-CM | POA: Diagnosis not present

## 2019-02-07 DIAGNOSIS — M14672 Charcot's joint, left ankle and foot: Secondary | ICD-10-CM | POA: Diagnosis not present

## 2019-02-08 DIAGNOSIS — E669 Obesity, unspecified: Secondary | ICD-10-CM | POA: Diagnosis not present

## 2019-02-08 DIAGNOSIS — N2581 Secondary hyperparathyroidism of renal origin: Secondary | ICD-10-CM | POA: Diagnosis not present

## 2019-02-08 DIAGNOSIS — Z6841 Body Mass Index (BMI) 40.0 and over, adult: Secondary | ICD-10-CM | POA: Diagnosis not present

## 2019-02-08 DIAGNOSIS — Z713 Dietary counseling and surveillance: Secondary | ICD-10-CM | POA: Diagnosis not present

## 2019-02-08 DIAGNOSIS — N186 End stage renal disease: Secondary | ICD-10-CM | POA: Diagnosis not present

## 2019-02-08 DIAGNOSIS — D509 Iron deficiency anemia, unspecified: Secondary | ICD-10-CM | POA: Diagnosis not present

## 2019-02-08 DIAGNOSIS — D631 Anemia in chronic kidney disease: Secondary | ICD-10-CM | POA: Diagnosis not present

## 2019-02-08 DIAGNOSIS — E119 Type 2 diabetes mellitus without complications: Secondary | ICD-10-CM | POA: Diagnosis not present

## 2019-02-09 DIAGNOSIS — E1142 Type 2 diabetes mellitus with diabetic polyneuropathy: Secondary | ICD-10-CM | POA: Diagnosis not present

## 2019-02-09 DIAGNOSIS — M1990 Unspecified osteoarthritis, unspecified site: Secondary | ICD-10-CM | POA: Diagnosis not present

## 2019-02-09 DIAGNOSIS — E1122 Type 2 diabetes mellitus with diabetic chronic kidney disease: Secondary | ICD-10-CM | POA: Diagnosis not present

## 2019-02-09 DIAGNOSIS — E113413 Type 2 diabetes mellitus with severe nonproliferative diabetic retinopathy with macular edema, bilateral: Secondary | ICD-10-CM | POA: Diagnosis not present

## 2019-02-09 DIAGNOSIS — H40053 Ocular hypertension, bilateral: Secondary | ICD-10-CM | POA: Diagnosis not present

## 2019-02-09 DIAGNOSIS — N185 Chronic kidney disease, stage 5: Secondary | ICD-10-CM | POA: Diagnosis not present

## 2019-02-09 DIAGNOSIS — D631 Anemia in chronic kidney disease: Secondary | ICD-10-CM | POA: Diagnosis not present

## 2019-02-09 DIAGNOSIS — E785 Hyperlipidemia, unspecified: Secondary | ICD-10-CM | POA: Diagnosis not present

## 2019-02-09 DIAGNOSIS — Z961 Presence of intraocular lens: Secondary | ICD-10-CM | POA: Diagnosis not present

## 2019-02-09 DIAGNOSIS — H527 Unspecified disorder of refraction: Secondary | ICD-10-CM | POA: Diagnosis not present

## 2019-02-09 DIAGNOSIS — I120 Hypertensive chronic kidney disease with stage 5 chronic kidney disease or end stage renal disease: Secondary | ICD-10-CM | POA: Diagnosis not present

## 2019-02-09 DIAGNOSIS — E1136 Type 2 diabetes mellitus with diabetic cataract: Secondary | ICD-10-CM | POA: Diagnosis not present

## 2019-02-09 DIAGNOSIS — H02831 Dermatochalasis of right upper eyelid: Secondary | ICD-10-CM | POA: Diagnosis not present

## 2019-02-09 DIAGNOSIS — H25812 Combined forms of age-related cataract, left eye: Secondary | ICD-10-CM | POA: Diagnosis not present

## 2019-02-09 DIAGNOSIS — Z9841 Cataract extraction status, right eye: Secondary | ICD-10-CM | POA: Diagnosis not present

## 2019-02-09 DIAGNOSIS — H02834 Dermatochalasis of left upper eyelid: Secondary | ICD-10-CM | POA: Diagnosis not present

## 2019-02-10 DIAGNOSIS — D631 Anemia in chronic kidney disease: Secondary | ICD-10-CM | POA: Diagnosis not present

## 2019-02-10 DIAGNOSIS — N2581 Secondary hyperparathyroidism of renal origin: Secondary | ICD-10-CM | POA: Diagnosis not present

## 2019-02-10 DIAGNOSIS — N186 End stage renal disease: Secondary | ICD-10-CM | POA: Diagnosis not present

## 2019-02-10 DIAGNOSIS — D509 Iron deficiency anemia, unspecified: Secondary | ICD-10-CM | POA: Diagnosis not present

## 2019-02-13 DIAGNOSIS — D631 Anemia in chronic kidney disease: Secondary | ICD-10-CM | POA: Diagnosis not present

## 2019-02-13 DIAGNOSIS — D509 Iron deficiency anemia, unspecified: Secondary | ICD-10-CM | POA: Diagnosis not present

## 2019-02-13 DIAGNOSIS — N186 End stage renal disease: Secondary | ICD-10-CM | POA: Diagnosis not present

## 2019-02-13 DIAGNOSIS — N2581 Secondary hyperparathyroidism of renal origin: Secondary | ICD-10-CM | POA: Diagnosis not present

## 2019-02-14 DIAGNOSIS — N186 End stage renal disease: Secondary | ICD-10-CM | POA: Diagnosis not present

## 2019-02-14 DIAGNOSIS — E1142 Type 2 diabetes mellitus with diabetic polyneuropathy: Secondary | ICD-10-CM | POA: Diagnosis not present

## 2019-02-14 DIAGNOSIS — Z79899 Other long term (current) drug therapy: Secondary | ICD-10-CM | POA: Diagnosis not present

## 2019-02-14 DIAGNOSIS — Z4902 Encounter for fitting and adjustment of peritoneal dialysis catheter: Secondary | ICD-10-CM | POA: Diagnosis not present

## 2019-02-14 DIAGNOSIS — Z87891 Personal history of nicotine dependence: Secondary | ICD-10-CM | POA: Diagnosis not present

## 2019-02-14 DIAGNOSIS — N183 Chronic kidney disease, stage 3 unspecified: Secondary | ICD-10-CM | POA: Diagnosis not present

## 2019-02-14 DIAGNOSIS — Z794 Long term (current) use of insulin: Secondary | ICD-10-CM | POA: Diagnosis not present

## 2019-02-14 DIAGNOSIS — E1122 Type 2 diabetes mellitus with diabetic chronic kidney disease: Secondary | ICD-10-CM | POA: Diagnosis not present

## 2019-02-14 DIAGNOSIS — K219 Gastro-esophageal reflux disease without esophagitis: Secondary | ICD-10-CM | POA: Diagnosis not present

## 2019-02-14 DIAGNOSIS — I120 Hypertensive chronic kidney disease with stage 5 chronic kidney disease or end stage renal disease: Secondary | ICD-10-CM | POA: Diagnosis not present

## 2019-02-14 DIAGNOSIS — E785 Hyperlipidemia, unspecified: Secondary | ICD-10-CM | POA: Diagnosis not present

## 2019-02-14 DIAGNOSIS — D631 Anemia in chronic kidney disease: Secondary | ICD-10-CM | POA: Diagnosis not present

## 2019-02-15 DIAGNOSIS — D631 Anemia in chronic kidney disease: Secondary | ICD-10-CM | POA: Diagnosis not present

## 2019-02-15 DIAGNOSIS — N186 End stage renal disease: Secondary | ICD-10-CM | POA: Diagnosis not present

## 2019-02-15 DIAGNOSIS — N2581 Secondary hyperparathyroidism of renal origin: Secondary | ICD-10-CM | POA: Diagnosis not present

## 2019-02-15 DIAGNOSIS — D509 Iron deficiency anemia, unspecified: Secondary | ICD-10-CM | POA: Diagnosis not present

## 2019-02-16 DIAGNOSIS — Z794 Long term (current) use of insulin: Secondary | ICD-10-CM | POA: Diagnosis not present

## 2019-02-16 DIAGNOSIS — M14672 Charcot's joint, left ankle and foot: Secondary | ICD-10-CM | POA: Diagnosis not present

## 2019-02-16 DIAGNOSIS — E1169 Type 2 diabetes mellitus with other specified complication: Secondary | ICD-10-CM | POA: Diagnosis not present

## 2019-02-16 DIAGNOSIS — N186 End stage renal disease: Secondary | ICD-10-CM | POA: Diagnosis not present

## 2019-02-17 DIAGNOSIS — N186 End stage renal disease: Secondary | ICD-10-CM | POA: Diagnosis not present

## 2019-02-20 DIAGNOSIS — N186 End stage renal disease: Secondary | ICD-10-CM | POA: Diagnosis not present

## 2019-02-22 DIAGNOSIS — N186 End stage renal disease: Secondary | ICD-10-CM | POA: Diagnosis not present

## 2019-02-24 DIAGNOSIS — N186 End stage renal disease: Secondary | ICD-10-CM | POA: Diagnosis not present

## 2019-02-24 DIAGNOSIS — E113411 Type 2 diabetes mellitus with severe nonproliferative diabetic retinopathy with macular edema, right eye: Secondary | ICD-10-CM | POA: Diagnosis not present

## 2019-02-27 DIAGNOSIS — D631 Anemia in chronic kidney disease: Secondary | ICD-10-CM | POA: Diagnosis not present

## 2019-02-27 DIAGNOSIS — N186 End stage renal disease: Secondary | ICD-10-CM | POA: Diagnosis not present

## 2019-02-27 DIAGNOSIS — D509 Iron deficiency anemia, unspecified: Secondary | ICD-10-CM | POA: Diagnosis not present

## 2019-03-01 DIAGNOSIS — N186 End stage renal disease: Secondary | ICD-10-CM | POA: Diagnosis not present

## 2019-03-01 DIAGNOSIS — D631 Anemia in chronic kidney disease: Secondary | ICD-10-CM | POA: Diagnosis not present

## 2019-03-01 DIAGNOSIS — D509 Iron deficiency anemia, unspecified: Secondary | ICD-10-CM | POA: Diagnosis not present

## 2019-03-04 DIAGNOSIS — D631 Anemia in chronic kidney disease: Secondary | ICD-10-CM | POA: Diagnosis not present

## 2019-03-04 DIAGNOSIS — N186 End stage renal disease: Secondary | ICD-10-CM | POA: Diagnosis not present

## 2019-03-04 DIAGNOSIS — D509 Iron deficiency anemia, unspecified: Secondary | ICD-10-CM | POA: Diagnosis not present

## 2019-03-06 DIAGNOSIS — D509 Iron deficiency anemia, unspecified: Secondary | ICD-10-CM | POA: Diagnosis not present

## 2019-03-06 DIAGNOSIS — D631 Anemia in chronic kidney disease: Secondary | ICD-10-CM | POA: Diagnosis not present

## 2019-03-06 DIAGNOSIS — N186 End stage renal disease: Secondary | ICD-10-CM | POA: Diagnosis not present

## 2019-03-07 DIAGNOSIS — E113413 Type 2 diabetes mellitus with severe nonproliferative diabetic retinopathy with macular edema, bilateral: Secondary | ICD-10-CM | POA: Diagnosis not present

## 2019-03-08 DIAGNOSIS — N186 End stage renal disease: Secondary | ICD-10-CM | POA: Diagnosis not present

## 2019-03-08 DIAGNOSIS — D509 Iron deficiency anemia, unspecified: Secondary | ICD-10-CM | POA: Diagnosis not present

## 2019-03-08 DIAGNOSIS — D631 Anemia in chronic kidney disease: Secondary | ICD-10-CM | POA: Diagnosis not present

## 2019-03-09 DIAGNOSIS — N186 End stage renal disease: Secondary | ICD-10-CM | POA: Diagnosis not present

## 2019-03-09 DIAGNOSIS — Z992 Dependence on renal dialysis: Secondary | ICD-10-CM | POA: Diagnosis not present

## 2019-03-10 DIAGNOSIS — N2581 Secondary hyperparathyroidism of renal origin: Secondary | ICD-10-CM | POA: Diagnosis not present

## 2019-03-10 DIAGNOSIS — N186 End stage renal disease: Secondary | ICD-10-CM | POA: Diagnosis not present

## 2019-03-10 DIAGNOSIS — D509 Iron deficiency anemia, unspecified: Secondary | ICD-10-CM | POA: Diagnosis not present

## 2019-03-10 DIAGNOSIS — D631 Anemia in chronic kidney disease: Secondary | ICD-10-CM | POA: Diagnosis not present

## 2019-03-13 DIAGNOSIS — N186 End stage renal disease: Secondary | ICD-10-CM | POA: Diagnosis not present

## 2019-03-13 DIAGNOSIS — D631 Anemia in chronic kidney disease: Secondary | ICD-10-CM | POA: Diagnosis not present

## 2019-03-13 DIAGNOSIS — D509 Iron deficiency anemia, unspecified: Secondary | ICD-10-CM | POA: Diagnosis not present

## 2019-03-13 DIAGNOSIS — N2581 Secondary hyperparathyroidism of renal origin: Secondary | ICD-10-CM | POA: Diagnosis not present

## 2019-03-15 DIAGNOSIS — N186 End stage renal disease: Secondary | ICD-10-CM | POA: Diagnosis not present

## 2019-03-15 DIAGNOSIS — E119 Type 2 diabetes mellitus without complications: Secondary | ICD-10-CM | POA: Diagnosis not present

## 2019-03-15 DIAGNOSIS — D509 Iron deficiency anemia, unspecified: Secondary | ICD-10-CM | POA: Diagnosis not present

## 2019-03-15 DIAGNOSIS — N2581 Secondary hyperparathyroidism of renal origin: Secondary | ICD-10-CM | POA: Diagnosis not present

## 2019-03-15 DIAGNOSIS — D631 Anemia in chronic kidney disease: Secondary | ICD-10-CM | POA: Diagnosis not present

## 2019-03-17 DIAGNOSIS — N2581 Secondary hyperparathyroidism of renal origin: Secondary | ICD-10-CM | POA: Diagnosis not present

## 2019-03-17 DIAGNOSIS — D509 Iron deficiency anemia, unspecified: Secondary | ICD-10-CM | POA: Diagnosis not present

## 2019-03-17 DIAGNOSIS — N186 End stage renal disease: Secondary | ICD-10-CM | POA: Diagnosis not present

## 2019-03-17 DIAGNOSIS — D631 Anemia in chronic kidney disease: Secondary | ICD-10-CM | POA: Diagnosis not present

## 2019-03-20 DIAGNOSIS — N186 End stage renal disease: Secondary | ICD-10-CM | POA: Diagnosis not present

## 2019-03-20 DIAGNOSIS — D631 Anemia in chronic kidney disease: Secondary | ICD-10-CM | POA: Diagnosis not present

## 2019-03-21 DIAGNOSIS — Z01818 Encounter for other preprocedural examination: Secondary | ICD-10-CM | POA: Diagnosis not present

## 2019-03-22 DIAGNOSIS — D631 Anemia in chronic kidney disease: Secondary | ICD-10-CM | POA: Diagnosis not present

## 2019-03-22 DIAGNOSIS — N186 End stage renal disease: Secondary | ICD-10-CM | POA: Diagnosis not present

## 2019-03-23 DIAGNOSIS — N186 End stage renal disease: Secondary | ICD-10-CM | POA: Diagnosis not present

## 2019-03-23 DIAGNOSIS — D631 Anemia in chronic kidney disease: Secondary | ICD-10-CM | POA: Diagnosis not present

## 2019-03-24 DIAGNOSIS — E1122 Type 2 diabetes mellitus with diabetic chronic kidney disease: Secondary | ICD-10-CM | POA: Diagnosis not present

## 2019-03-24 DIAGNOSIS — Z6841 Body Mass Index (BMI) 40.0 and over, adult: Secondary | ICD-10-CM | POA: Diagnosis not present

## 2019-03-24 DIAGNOSIS — I10 Essential (primary) hypertension: Secondary | ICD-10-CM | POA: Diagnosis not present

## 2019-03-24 DIAGNOSIS — Z992 Dependence on renal dialysis: Secondary | ICD-10-CM | POA: Diagnosis not present

## 2019-03-24 DIAGNOSIS — Z79899 Other long term (current) drug therapy: Secondary | ICD-10-CM | POA: Diagnosis not present

## 2019-03-24 DIAGNOSIS — Z87891 Personal history of nicotine dependence: Secondary | ICD-10-CM | POA: Diagnosis not present

## 2019-03-24 DIAGNOSIS — M14672 Charcot's joint, left ankle and foot: Secondary | ICD-10-CM | POA: Diagnosis not present

## 2019-03-24 DIAGNOSIS — Z794 Long term (current) use of insulin: Secondary | ICD-10-CM | POA: Diagnosis not present

## 2019-03-24 DIAGNOSIS — E11610 Type 2 diabetes mellitus with diabetic neuropathic arthropathy: Secondary | ICD-10-CM | POA: Diagnosis not present

## 2019-03-24 DIAGNOSIS — I120 Hypertensive chronic kidney disease with stage 5 chronic kidney disease or end stage renal disease: Secondary | ICD-10-CM | POA: Diagnosis not present

## 2019-03-24 DIAGNOSIS — N186 End stage renal disease: Secondary | ICD-10-CM | POA: Diagnosis not present

## 2019-03-24 DIAGNOSIS — Z20822 Contact with and (suspected) exposure to covid-19: Secondary | ICD-10-CM | POA: Diagnosis not present

## 2019-03-27 DIAGNOSIS — Z992 Dependence on renal dialysis: Secondary | ICD-10-CM | POA: Diagnosis not present

## 2019-03-27 DIAGNOSIS — N186 End stage renal disease: Secondary | ICD-10-CM | POA: Diagnosis not present

## 2019-03-27 DIAGNOSIS — M14672 Charcot's joint, left ankle and foot: Secondary | ICD-10-CM | POA: Diagnosis not present

## 2019-03-27 DIAGNOSIS — I10 Essential (primary) hypertension: Secondary | ICD-10-CM | POA: Diagnosis not present

## 2019-04-01 DIAGNOSIS — Z1159 Encounter for screening for other viral diseases: Secondary | ICD-10-CM | POA: Diagnosis not present

## 2019-04-01 DIAGNOSIS — S88112A Complete traumatic amputation at level between knee and ankle, left lower leg, initial encounter: Secondary | ICD-10-CM | POA: Diagnosis not present

## 2019-04-01 DIAGNOSIS — M14672 Charcot's joint, left ankle and foot: Secondary | ICD-10-CM | POA: Diagnosis not present

## 2019-04-01 DIAGNOSIS — Z6841 Body Mass Index (BMI) 40.0 and over, adult: Secondary | ICD-10-CM | POA: Diagnosis not present

## 2019-04-01 DIAGNOSIS — E1140 Type 2 diabetes mellitus with diabetic neuropathy, unspecified: Secondary | ICD-10-CM | POA: Diagnosis not present

## 2019-04-01 DIAGNOSIS — Z114 Encounter for screening for human immunodeficiency virus [HIV]: Secondary | ICD-10-CM | POA: Diagnosis not present

## 2019-04-01 DIAGNOSIS — D649 Anemia, unspecified: Secondary | ICD-10-CM | POA: Diagnosis not present

## 2019-04-01 DIAGNOSIS — Z4781 Encounter for orthopedic aftercare following surgical amputation: Secondary | ICD-10-CM | POA: Diagnosis not present

## 2019-04-01 DIAGNOSIS — R279 Unspecified lack of coordination: Secondary | ICD-10-CM | POA: Diagnosis not present

## 2019-04-01 DIAGNOSIS — E785 Hyperlipidemia, unspecified: Secondary | ICD-10-CM | POA: Diagnosis not present

## 2019-04-01 DIAGNOSIS — Z794 Long term (current) use of insulin: Secondary | ICD-10-CM | POA: Diagnosis not present

## 2019-04-01 DIAGNOSIS — E1165 Type 2 diabetes mellitus with hyperglycemia: Secondary | ICD-10-CM | POA: Diagnosis not present

## 2019-04-01 DIAGNOSIS — E1121 Type 2 diabetes mellitus with diabetic nephropathy: Secondary | ICD-10-CM | POA: Diagnosis not present

## 2019-04-01 DIAGNOSIS — I10 Essential (primary) hypertension: Secondary | ICD-10-CM | POA: Diagnosis not present

## 2019-04-01 DIAGNOSIS — Z89512 Acquired absence of left leg below knee: Secondary | ICD-10-CM | POA: Diagnosis not present

## 2019-04-01 DIAGNOSIS — E1142 Type 2 diabetes mellitus with diabetic polyneuropathy: Secondary | ICD-10-CM | POA: Diagnosis not present

## 2019-04-01 DIAGNOSIS — Z992 Dependence on renal dialysis: Secondary | ICD-10-CM | POA: Diagnosis not present

## 2019-04-01 DIAGNOSIS — I120 Hypertensive chronic kidney disease with stage 5 chronic kidney disease or end stage renal disease: Secondary | ICD-10-CM | POA: Diagnosis not present

## 2019-04-01 DIAGNOSIS — Z743 Need for continuous supervision: Secondary | ICD-10-CM | POA: Diagnosis not present

## 2019-04-01 DIAGNOSIS — N186 End stage renal disease: Secondary | ICD-10-CM | POA: Diagnosis not present

## 2019-04-09 DIAGNOSIS — Z992 Dependence on renal dialysis: Secondary | ICD-10-CM | POA: Diagnosis not present

## 2019-04-09 DIAGNOSIS — N186 End stage renal disease: Secondary | ICD-10-CM | POA: Diagnosis not present

## 2019-04-10 DIAGNOSIS — D509 Iron deficiency anemia, unspecified: Secondary | ICD-10-CM | POA: Diagnosis not present

## 2019-04-10 DIAGNOSIS — D631 Anemia in chronic kidney disease: Secondary | ICD-10-CM | POA: Diagnosis not present

## 2019-04-10 DIAGNOSIS — N2581 Secondary hyperparathyroidism of renal origin: Secondary | ICD-10-CM | POA: Diagnosis not present

## 2019-04-10 DIAGNOSIS — N186 End stage renal disease: Secondary | ICD-10-CM | POA: Diagnosis not present

## 2019-04-12 DIAGNOSIS — N2581 Secondary hyperparathyroidism of renal origin: Secondary | ICD-10-CM | POA: Diagnosis not present

## 2019-04-12 DIAGNOSIS — N186 End stage renal disease: Secondary | ICD-10-CM | POA: Diagnosis not present

## 2019-04-12 DIAGNOSIS — D631 Anemia in chronic kidney disease: Secondary | ICD-10-CM | POA: Diagnosis not present

## 2019-04-12 DIAGNOSIS — D509 Iron deficiency anemia, unspecified: Secondary | ICD-10-CM | POA: Diagnosis not present

## 2019-04-12 DIAGNOSIS — E119 Type 2 diabetes mellitus without complications: Secondary | ICD-10-CM | POA: Diagnosis not present

## 2019-04-14 DIAGNOSIS — D631 Anemia in chronic kidney disease: Secondary | ICD-10-CM | POA: Diagnosis not present

## 2019-04-14 DIAGNOSIS — D509 Iron deficiency anemia, unspecified: Secondary | ICD-10-CM | POA: Diagnosis not present

## 2019-04-14 DIAGNOSIS — E113413 Type 2 diabetes mellitus with severe nonproliferative diabetic retinopathy with macular edema, bilateral: Secondary | ICD-10-CM | POA: Diagnosis not present

## 2019-04-14 DIAGNOSIS — N186 End stage renal disease: Secondary | ICD-10-CM | POA: Diagnosis not present

## 2019-04-14 DIAGNOSIS — N2581 Secondary hyperparathyroidism of renal origin: Secondary | ICD-10-CM | POA: Diagnosis not present

## 2019-04-17 DIAGNOSIS — D631 Anemia in chronic kidney disease: Secondary | ICD-10-CM | POA: Diagnosis not present

## 2019-04-17 DIAGNOSIS — N186 End stage renal disease: Secondary | ICD-10-CM | POA: Diagnosis not present

## 2019-04-17 DIAGNOSIS — N2581 Secondary hyperparathyroidism of renal origin: Secondary | ICD-10-CM | POA: Diagnosis not present

## 2019-04-17 DIAGNOSIS — D509 Iron deficiency anemia, unspecified: Secondary | ICD-10-CM | POA: Diagnosis not present

## 2019-04-19 DIAGNOSIS — D631 Anemia in chronic kidney disease: Secondary | ICD-10-CM | POA: Diagnosis not present

## 2019-04-19 DIAGNOSIS — N2581 Secondary hyperparathyroidism of renal origin: Secondary | ICD-10-CM | POA: Diagnosis not present

## 2019-04-19 DIAGNOSIS — D509 Iron deficiency anemia, unspecified: Secondary | ICD-10-CM | POA: Diagnosis not present

## 2019-04-19 DIAGNOSIS — N186 End stage renal disease: Secondary | ICD-10-CM | POA: Diagnosis not present

## 2019-04-21 DIAGNOSIS — N2581 Secondary hyperparathyroidism of renal origin: Secondary | ICD-10-CM | POA: Diagnosis not present

## 2019-04-21 DIAGNOSIS — D509 Iron deficiency anemia, unspecified: Secondary | ICD-10-CM | POA: Diagnosis not present

## 2019-04-21 DIAGNOSIS — N186 End stage renal disease: Secondary | ICD-10-CM | POA: Diagnosis not present

## 2019-04-21 DIAGNOSIS — D631 Anemia in chronic kidney disease: Secondary | ICD-10-CM | POA: Diagnosis not present

## 2019-04-24 DIAGNOSIS — N186 End stage renal disease: Secondary | ICD-10-CM | POA: Diagnosis not present

## 2019-04-24 DIAGNOSIS — D631 Anemia in chronic kidney disease: Secondary | ICD-10-CM | POA: Diagnosis not present

## 2019-04-24 DIAGNOSIS — D509 Iron deficiency anemia, unspecified: Secondary | ICD-10-CM | POA: Diagnosis not present

## 2019-04-24 DIAGNOSIS — N2581 Secondary hyperparathyroidism of renal origin: Secondary | ICD-10-CM | POA: Diagnosis not present

## 2019-04-26 DIAGNOSIS — N186 End stage renal disease: Secondary | ICD-10-CM | POA: Diagnosis not present

## 2019-04-26 DIAGNOSIS — D509 Iron deficiency anemia, unspecified: Secondary | ICD-10-CM | POA: Diagnosis not present

## 2019-04-26 DIAGNOSIS — D631 Anemia in chronic kidney disease: Secondary | ICD-10-CM | POA: Diagnosis not present

## 2019-04-26 DIAGNOSIS — N2581 Secondary hyperparathyroidism of renal origin: Secondary | ICD-10-CM | POA: Diagnosis not present

## 2019-04-28 ENCOUNTER — Other Ambulatory Visit: Payer: Self-pay

## 2019-04-28 DIAGNOSIS — N186 End stage renal disease: Secondary | ICD-10-CM | POA: Diagnosis not present

## 2019-04-28 DIAGNOSIS — N2581 Secondary hyperparathyroidism of renal origin: Secondary | ICD-10-CM | POA: Diagnosis not present

## 2019-04-28 DIAGNOSIS — D631 Anemia in chronic kidney disease: Secondary | ICD-10-CM | POA: Diagnosis not present

## 2019-04-28 DIAGNOSIS — D509 Iron deficiency anemia, unspecified: Secondary | ICD-10-CM | POA: Diagnosis not present

## 2019-05-01 DIAGNOSIS — N2581 Secondary hyperparathyroidism of renal origin: Secondary | ICD-10-CM | POA: Diagnosis not present

## 2019-05-01 DIAGNOSIS — D509 Iron deficiency anemia, unspecified: Secondary | ICD-10-CM | POA: Diagnosis not present

## 2019-05-01 DIAGNOSIS — N186 End stage renal disease: Secondary | ICD-10-CM | POA: Diagnosis not present

## 2019-05-01 DIAGNOSIS — D631 Anemia in chronic kidney disease: Secondary | ICD-10-CM | POA: Diagnosis not present

## 2019-05-02 ENCOUNTER — Encounter: Payer: Self-pay | Admitting: Internal Medicine

## 2019-05-02 ENCOUNTER — Other Ambulatory Visit: Payer: Self-pay

## 2019-05-02 ENCOUNTER — Ambulatory Visit (INDEPENDENT_AMBULATORY_CARE_PROVIDER_SITE_OTHER): Payer: BC Managed Care – PPO | Admitting: Internal Medicine

## 2019-05-02 VITALS — BP 136/70 | HR 84 | Ht 71.0 in | Wt 287.0 lb

## 2019-05-02 DIAGNOSIS — E1122 Type 2 diabetes mellitus with diabetic chronic kidney disease: Secondary | ICD-10-CM | POA: Diagnosis not present

## 2019-05-02 DIAGNOSIS — E785 Hyperlipidemia, unspecified: Secondary | ICD-10-CM

## 2019-05-02 LAB — POCT GLYCOSYLATED HEMOGLOBIN (HGB A1C): Hemoglobin A1C: 9.1 % — AB (ref 4.0–5.6)

## 2019-05-02 MED ORDER — GLIPIZIDE 5 MG PO TABS: 5.0000 mg | ORAL_TABLET | Freq: Two times a day (BID) | ORAL | 3 refills | Status: DC

## 2019-05-02 NOTE — Patient Instructions (Signed)
Please continue: - Basaglar 36 units in the evening - Ozempic 1 mg weekly but move this to Sundays  Add: - Glipizide 5 mg before b'fast (only in non-dialysis days) and before dinner   Please come back for a follow-up appointment in 3 months.

## 2019-05-02 NOTE — Addendum Note (Signed)
Addended by: Darliss Ridgel I on: 05/02/2019 03:53 PM   Modules accepted: Orders

## 2019-05-02 NOTE — Progress Notes (Signed)
Patient ID: Reginald Carpenter, male   DOB: 06-Jun-1965, 54 y.o.   MRN: [id number]   This visit occurred during the SARS-CoV-2 public health emergency.  Safety protocols were in place, including screening questions prior to the visit, additional usage of staff PPE, and extensive cleaning of exam room while observing appropriate contact time as indicated for disinfecting solutions.   HPI: Reginald Carpenter is a 54-year-old male, initially referred by his PCP, Marda Stalker, PA, presenting for f/u for DM2, dx in ~2000, prev. insulin-dependent since 2017, uncontrolled, with complications (CKD, PN - + Charcot foot L ankle, DR). Last visit 3 months ago.   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 He was in the hospital x 1 week, then 1 week in rehab. He was on SSI rapid acting insulin in the hospital.   He was on hemodialysis at 5:45-11 am MWF, but switched to peritoneal dialysis in the past.  However, right before last visit he was switched back to hemodialysis (TTS, at 6:30 AM) due to pelvic pain during peritoneal dialysis. At this visit, he tells me that his dialysis was switched to MWF at 6 AM.  Reviewed HbA1c levels: Lab Results  Component Value Date   HGBA1C 9.5 (A) 01/27/2019   HGBA1C 9.5 (A) 10/26/2018   HGBA1C 7.8 (A) 03/14/2018  10/22/2016: HbA1c 9.2%  Pt is on a regimen of: - Basaglar 25 >> 28 >> 36 units at bedtime >> in am >> in the evening - Ozempic 0.5 mg weekly-started 03/2018 >> 1 mg weekly - taken on Monday morning, right before dialysis (!!!) We stopped Amaryl in 03/2018. Stopped Metformin 1000 mg 2x a day, with meals due to poor kidney function He was previously on Amaryl 2 mg before dinner - started 10/2016, stopped 01/2014 due to lows  Pt checks his sugars 1-2 times a day: - am: 95-140s >> 93, 115-120 >> 150-180 >> 200-230 >> 156-221, 275 - 2h after b'fast: n/c  - before lunch:120s >> 115-120 >> n/c >> 180-210 >> 156-215 - 2h after lunch: n/c - before dinner: 160-210 >> 150s (no snacks) >>  n/c  >> 165-212 - 2h after dinner: n/c >> 126-143 >> n/c >> 262 - bedtime: 120-150 >> 180-230 >> 180-200, 310 >> 178-210, 240 - nighttime: n/c Lowest sugar was 90s >> 93 >> 130 >> 150; hypoglycemia awareness in the 60s. Highest sugar was 210 >> 210 >> 310 >> 275   Glucometer: [employer]  Pt's meals are: - Breakfast: cereal; sausage and eggs - Lunch: ham and cheese sandwich, salads, pot pie - Dinner: salad, spaghetti, grilled chicken and steamed veggies - Snacks: cottage cheese He was drinking regular sodas but stopped in 2018.  -+ ESRD. 04/02/2019: GFR 12 10/24/2018: 38/1.0 12/17/2017: 65/4.75 10/15/2017: 57/3.87, GFR 17 07/01/2017: ?/2.88, GFR 24, Protein/creatinie ratio: 13,978 05/14/2017: ?/2.42, GFR 30 10/22/2016: 44/1.84, GFR 39, glucose 182  No results found for: BUN, CREATININE  On lisinopril 20.  -+ HL; last set of lipids: 10/24/2018: 202/260/37/134 12/17/2017: 205/182/35/134 10/22/2016: 208/236/45/115 No results found for: CHOL, HDL, LDLCALC, LDLDIRECT, TRIG, CHOLHDL  On Crestor 10.  - last eye exam was in 2020: + DR-getting IO injections.  He had bilateral cataract surgeries.  -+ Numbness and tingling in his feet.  He had Charcot foot surgery in 05/2017 had to have bone graft surgery.   Pt has FH of DM in mother, sister (GDM).  He also has a history of hypertension, but this improved so he stopped antihypertensive medication.  She is on disability.  ROS: Constitutional: no weight gain/+ weight loss, no fatigue, no subjective hyperthermia, no subjective hypothermia Eyes: no blurry vision, no xerophthalmia ENT: no sore throat, no nodules palpated in neck, no dysphagia, no odynophagia, no hoarseness Cardiovascular: no CP/no SOB/no palpitations/no leg swelling Respiratory: no cough/no SOB/no wheezing Gastrointestinal: no N/no V/no D/no C/no acid reflux Musculoskeletal: no muscle aches/+joint aches  Skin: no rashes, no hair loss Neurological: no tremors/+  numbness/+ tingling/no dizziness  I reviewed pt's medications, allergies, PMH, social hx, family hx, and changes were documented in the history of present illness. Otherwise, unchanged from my initial visit note.   Past Medical History:  Diagnosis Date  . 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 Chronic kidney disease   . Diabetes mellitus without complication ([facility])    type 2  . High cholesterol   . Hypertension   . Neuropathy    Past Surgical History:  Procedure Laterality Date  . ANKLE SURGERY     multiple ankle surgeries, with hardware and hardware removal at [facility]  . AV FISTULA PLACEMENT Right 11/09/2017   Procedure: Creation of Right Upper Arm Brachiocephalic ARTERIOVENOUS (AV) FISTULA;  Surgeon: Chuck Hint, MD;  Location: [facility];  Service: Vascular;  Laterality: Right;  . CARPAL TUNNEL RELEASE Bilateral   . COLONOSCOPY     Social History   Social History  . Marital status: Married    Spouse name: N/A  . Number of children: 2   Occupational History  . Out of work   Social History Main Topics  . Smoking status: Former Smoker - quit 2004    Years: 10.00  . Smokeless tobacco: Never Used  . Alcohol use No  . Drug use: No   Current Outpatient Medications on File Prior to Visit  Medication Sig Dispense Refill  . BD PEN NEEDLE NANO 2ND GEN 32G X 4 MM MISC 1 EACH BY DOES NOT APPLY ROUTE DAILY. WITH BASAGLAR PEN 100 each 11  . calcium acetate (PHOSLO) 667 MG capsule Take 667 mg by mouth 3 (three) times daily with meals.     . gabapentin (NEURONTIN) 100 MG capsule Take 100 mg by mouth 2 (two) times daily.    . Insulin Glargine (BASAGLAR KWIKPEN) 100 UNIT/ML SOPN INJECT 28 UNITS INTO THE SKIN AT BEDTIME. 45 mL 3  . rosuvastatin (CRESTOR) 10 MG tablet Take 10 mg by mouth daily.    . Semaglutide, 1 MG/DOSE, (OZEMPIC, 1 MG/DOSE,) 2 MG/1.5ML SOPN Inject 1 mg into the skin once a week. 6 pen 3  . sodium bicarbonate 650 MG tablet Take 1,300 mg by  mouth 2 (two) times daily.     . Vitamin D, Ergocalciferol, (DRISDOL) 50000 units CAPS capsule Take 50,000 Units by mouth every Monday.   0   No current facility-administered medications on file prior to visit.   No Known Allergies Family History  Problem Relation Age of Onset  . Heart failure Mother    Also; HTN, HL, heart ds, lung CA in Mother  PE: BP 136/70   Pulse 84   Ht 5\' 11"  (1.803 m)   Wt 287 lb (130.2 kg)   SpO2 97%   BMI 40.03 kg/m  Wt Readings from Last 3 Encounters:  05/02/19 287 lb (130.2 kg)  01/27/19 (!) 310 lb (140.6 kg)  10/26/18 (!) 311 lb (141.1 kg)   Constitutional: overweight, in NAD Eyes: PERRLA, EOMI, no exophthalmos ENT: moist mucous membranes, no thyromegaly, no cervical lymphadenopathy Cardiovascular:  RRR, No MRG Respiratory: CTA B Gastrointestinal: abdomen soft, NT, ND, BS+ Musculoskeletal: + deformities  +L bka, strength intact in all 4 Skin: moist, warm, no rashes Neurological: no tremor with outstretched hands, DTR normal in all 4  ASSESSMENT: 1. DM2, insulin-dependent, uncontrolled, with complications - PN - DR - CKD with proteinuria  - sees nephrology. 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  2. HL  3.  Obesity class III  PLAN:  1. Patient with longstanding, uncontrolled, type 2 diabetes, on basal insulin and GLP-1 receptor agonist.  His sugars improved on a more plant-based diet in the past, without concentrated sweets and we were able to stop the sulfonylurea.  At last visit, sugars were still high, as he was injecting both Ozempic and Basaglar in the shoulder.  Discussed about moving the abdomen and also rotating sites.  However, before last visit, he restarted dialysis so we discussed about moving Basaglar back to evenings to avoid being dialyzed off.  We also increased the dose.  I advised him to  contact me in about 4 weeks if his sugars do not improve to add rapid acting insulin.  He did not contact me afterwards. -At last visit he was also planning to see a weight loss specialist at [facility] as he needed to lose 40 pounds in preparation for renal transplant. -At this visit, sugars are quite variable, but worse in the last 2 months.  Upon questioning, he was moved from TTS dialysis to MWF.  However, he still takes his Ozempic on Monday mornings.  I am afraid that some of this may be dialyzed off.  I advised him to move Ozempic on Sundays.  However, I am not sure if this would be enough to improve his blood sugar so we discussed about adding back the sulfonylurea, in an effort to avoid mealtime insulin for now.  If he is dropping his sugars too low, we discussed about reducing the dose to only half a 5 mg tablet before meals. - I suggested to:  Patient Instructions  Please continue: - Basaglar 36 units in the evening - Ozempic 1 mg weekly but move this to Sundays  Add: - Glipizide 5 mg before b'fast (only in non-dialysis days) and before dinner   Please come back for a follow-up appointment in 3 months.  - we checked his HbA1c: 9.1% (better) - advised to check sugars at different times of the day - 1-2x a day, rotating check times - advised for yearly eye exams >> he is UTD - return to clinic in 3 months    2. HL -Reviewed latest lipid panel from 10/2018: LDL above goal, triglycerides high -Continues Crestor without side effects.  This was changed from Lipitor after the above results returned.  3.  Obesity class III -Continue Ozempic which should also help with weight loss -He lost a significant amount of weight since last visit. Part of this may be fluid loss as he decreased his dry weight.  Carlus Pavlov, MD PhD [facility]

## 2019-05-03 DIAGNOSIS — D631 Anemia in chronic kidney disease: Secondary | ICD-10-CM | POA: Diagnosis not present

## 2019-05-03 DIAGNOSIS — D509 Iron deficiency anemia, unspecified: Secondary | ICD-10-CM | POA: Diagnosis not present

## 2019-05-03 DIAGNOSIS — N186 End stage renal disease: Secondary | ICD-10-CM | POA: Diagnosis not present

## 2019-05-03 DIAGNOSIS — N2581 Secondary hyperparathyroidism of renal origin: Secondary | ICD-10-CM | POA: Diagnosis not present

## 2019-05-05 DIAGNOSIS — D509 Iron deficiency anemia, unspecified: Secondary | ICD-10-CM | POA: Diagnosis not present

## 2019-05-05 DIAGNOSIS — D631 Anemia in chronic kidney disease: Secondary | ICD-10-CM | POA: Diagnosis not present

## 2019-05-05 DIAGNOSIS — N186 End stage renal disease: Secondary | ICD-10-CM | POA: Diagnosis not present

## 2019-05-05 DIAGNOSIS — N2581 Secondary hyperparathyroidism of renal origin: Secondary | ICD-10-CM | POA: Diagnosis not present

## 2019-05-07 DIAGNOSIS — Z89512 Acquired absence of left leg below knee: Secondary | ICD-10-CM | POA: Diagnosis not present

## 2019-05-07 DIAGNOSIS — I10 Essential (primary) hypertension: Secondary | ICD-10-CM | POA: Diagnosis not present

## 2019-05-07 DIAGNOSIS — N186 End stage renal disease: Secondary | ICD-10-CM | POA: Diagnosis not present

## 2019-05-07 DIAGNOSIS — Z992 Dependence on renal dialysis: Secondary | ICD-10-CM | POA: Diagnosis not present

## 2019-05-07 DIAGNOSIS — E785 Hyperlipidemia, unspecified: Secondary | ICD-10-CM | POA: Diagnosis not present

## 2019-05-08 DIAGNOSIS — N186 End stage renal disease: Secondary | ICD-10-CM | POA: Diagnosis not present

## 2019-05-08 DIAGNOSIS — D509 Iron deficiency anemia, unspecified: Secondary | ICD-10-CM | POA: Diagnosis not present

## 2019-05-10 DIAGNOSIS — N186 End stage renal disease: Secondary | ICD-10-CM | POA: Diagnosis not present

## 2019-05-10 DIAGNOSIS — E119 Type 2 diabetes mellitus without complications: Secondary | ICD-10-CM | POA: Diagnosis not present

## 2019-05-10 DIAGNOSIS — D509 Iron deficiency anemia, unspecified: Secondary | ICD-10-CM | POA: Diagnosis not present

## 2019-05-12 DIAGNOSIS — D509 Iron deficiency anemia, unspecified: Secondary | ICD-10-CM | POA: Diagnosis not present

## 2019-05-12 DIAGNOSIS — N186 End stage renal disease: Secondary | ICD-10-CM | POA: Diagnosis not present

## 2019-05-15 DIAGNOSIS — N186 End stage renal disease: Secondary | ICD-10-CM | POA: Diagnosis not present

## 2019-05-15 DIAGNOSIS — D509 Iron deficiency anemia, unspecified: Secondary | ICD-10-CM | POA: Diagnosis not present

## 2019-05-17 DIAGNOSIS — D509 Iron deficiency anemia, unspecified: Secondary | ICD-10-CM | POA: Diagnosis not present

## 2019-05-17 DIAGNOSIS — N186 End stage renal disease: Secondary | ICD-10-CM | POA: Diagnosis not present

## 2019-05-19 DIAGNOSIS — N186 End stage renal disease: Secondary | ICD-10-CM | POA: Diagnosis not present

## 2019-05-22 DIAGNOSIS — N186 End stage renal disease: Secondary | ICD-10-CM | POA: Diagnosis not present

## 2019-05-24 DIAGNOSIS — N186 End stage renal disease: Secondary | ICD-10-CM | POA: Diagnosis not present

## 2019-05-26 DIAGNOSIS — N186 End stage renal disease: Secondary | ICD-10-CM | POA: Diagnosis not present

## 2019-05-29 DIAGNOSIS — N186 End stage renal disease: Secondary | ICD-10-CM | POA: Diagnosis not present

## 2019-05-29 DIAGNOSIS — Z23 Encounter for immunization: Secondary | ICD-10-CM | POA: Diagnosis not present

## 2019-05-31 DIAGNOSIS — Z23 Encounter for immunization: Secondary | ICD-10-CM | POA: Diagnosis not present

## 2019-05-31 DIAGNOSIS — N186 End stage renal disease: Secondary | ICD-10-CM | POA: Diagnosis not present

## 2019-06-02 DIAGNOSIS — Z23 Encounter for immunization: Secondary | ICD-10-CM | POA: Diagnosis not present

## 2019-06-02 DIAGNOSIS — N186 End stage renal disease: Secondary | ICD-10-CM | POA: Diagnosis not present

## 2019-06-05 DIAGNOSIS — N186 End stage renal disease: Secondary | ICD-10-CM | POA: Diagnosis not present

## 2019-06-05 DIAGNOSIS — Z23 Encounter for immunization: Secondary | ICD-10-CM | POA: Diagnosis not present

## 2019-06-05 DIAGNOSIS — I10 Essential (primary) hypertension: Secondary | ICD-10-CM | POA: Diagnosis not present

## 2019-06-05 DIAGNOSIS — Z89512 Acquired absence of left leg below knee: Secondary | ICD-10-CM | POA: Diagnosis not present

## 2019-06-05 DIAGNOSIS — E785 Hyperlipidemia, unspecified: Secondary | ICD-10-CM | POA: Diagnosis not present

## 2019-06-07 DIAGNOSIS — Z992 Dependence on renal dialysis: Secondary | ICD-10-CM | POA: Diagnosis not present

## 2019-06-07 DIAGNOSIS — Z23 Encounter for immunization: Secondary | ICD-10-CM | POA: Diagnosis not present

## 2019-06-07 DIAGNOSIS — N186 End stage renal disease: Secondary | ICD-10-CM | POA: Diagnosis not present

## 2019-06-09 DIAGNOSIS — N2581 Secondary hyperparathyroidism of renal origin: Secondary | ICD-10-CM | POA: Diagnosis not present

## 2019-06-09 DIAGNOSIS — N186 End stage renal disease: Secondary | ICD-10-CM | POA: Diagnosis not present

## 2019-06-09 DIAGNOSIS — D509 Iron deficiency anemia, unspecified: Secondary | ICD-10-CM | POA: Diagnosis not present

## 2019-06-12 DIAGNOSIS — D509 Iron deficiency anemia, unspecified: Secondary | ICD-10-CM | POA: Diagnosis not present

## 2019-06-12 DIAGNOSIS — N186 End stage renal disease: Secondary | ICD-10-CM | POA: Diagnosis not present

## 2019-06-12 DIAGNOSIS — N2581 Secondary hyperparathyroidism of renal origin: Secondary | ICD-10-CM | POA: Diagnosis not present

## 2019-06-14 DIAGNOSIS — D509 Iron deficiency anemia, unspecified: Secondary | ICD-10-CM | POA: Diagnosis not present

## 2019-06-14 DIAGNOSIS — N2581 Secondary hyperparathyroidism of renal origin: Secondary | ICD-10-CM | POA: Diagnosis not present

## 2019-06-14 DIAGNOSIS — N186 End stage renal disease: Secondary | ICD-10-CM | POA: Diagnosis not present

## 2019-06-14 DIAGNOSIS — E119 Type 2 diabetes mellitus without complications: Secondary | ICD-10-CM | POA: Diagnosis not present

## 2019-06-16 DIAGNOSIS — N186 End stage renal disease: Secondary | ICD-10-CM | POA: Diagnosis not present

## 2019-06-16 DIAGNOSIS — D509 Iron deficiency anemia, unspecified: Secondary | ICD-10-CM | POA: Diagnosis not present

## 2019-06-16 DIAGNOSIS — E113413 Type 2 diabetes mellitus with severe nonproliferative diabetic retinopathy with macular edema, bilateral: Secondary | ICD-10-CM | POA: Diagnosis not present

## 2019-06-16 DIAGNOSIS — H2512 Age-related nuclear cataract, left eye: Secondary | ICD-10-CM | POA: Diagnosis not present

## 2019-06-16 DIAGNOSIS — H40053 Ocular hypertension, bilateral: Secondary | ICD-10-CM | POA: Diagnosis not present

## 2019-06-16 DIAGNOSIS — H35033 Hypertensive retinopathy, bilateral: Secondary | ICD-10-CM | POA: Diagnosis not present

## 2019-06-16 DIAGNOSIS — N2581 Secondary hyperparathyroidism of renal origin: Secondary | ICD-10-CM | POA: Diagnosis not present

## 2019-06-19 DIAGNOSIS — D509 Iron deficiency anemia, unspecified: Secondary | ICD-10-CM | POA: Diagnosis not present

## 2019-06-19 DIAGNOSIS — N186 End stage renal disease: Secondary | ICD-10-CM | POA: Diagnosis not present

## 2019-06-21 DIAGNOSIS — N186 End stage renal disease: Secondary | ICD-10-CM | POA: Diagnosis not present

## 2019-06-21 DIAGNOSIS — D509 Iron deficiency anemia, unspecified: Secondary | ICD-10-CM | POA: Diagnosis not present

## 2019-06-22 DIAGNOSIS — M549 Dorsalgia, unspecified: Secondary | ICD-10-CM | POA: Diagnosis not present

## 2019-06-22 DIAGNOSIS — W19XXXA Unspecified fall, initial encounter: Secondary | ICD-10-CM | POA: Diagnosis not present

## 2019-06-22 DIAGNOSIS — M545 Low back pain: Secondary | ICD-10-CM | POA: Diagnosis not present

## 2019-06-22 DIAGNOSIS — M542 Cervicalgia: Secondary | ICD-10-CM | POA: Diagnosis not present

## 2019-06-22 DIAGNOSIS — R10814 Left lower quadrant abdominal tenderness: Secondary | ICD-10-CM | POA: Diagnosis not present

## 2019-06-22 DIAGNOSIS — Y92002 Bathroom of unspecified non-institutional (private) residence single-family (private) house as the place of occurrence of the external cause: Secondary | ICD-10-CM | POA: Diagnosis not present

## 2019-06-22 DIAGNOSIS — M546 Pain in thoracic spine: Secondary | ICD-10-CM | POA: Diagnosis not present

## 2019-06-22 DIAGNOSIS — M21211 Flexion deformity, right shoulder: Secondary | ICD-10-CM | POA: Diagnosis not present

## 2019-06-22 DIAGNOSIS — Z87891 Personal history of nicotine dependence: Secondary | ICD-10-CM | POA: Diagnosis not present

## 2019-06-22 DIAGNOSIS — M25511 Pain in right shoulder: Secondary | ICD-10-CM | POA: Diagnosis not present

## 2019-06-22 DIAGNOSIS — R1032 Left lower quadrant pain: Secondary | ICD-10-CM | POA: Diagnosis not present

## 2019-06-22 DIAGNOSIS — I672 Cerebral atherosclerosis: Secondary | ICD-10-CM | POA: Diagnosis not present

## 2019-06-22 DIAGNOSIS — Z043 Encounter for examination and observation following other accident: Secondary | ICD-10-CM | POA: Diagnosis not present

## 2019-06-22 DIAGNOSIS — W1789XA Other fall from one level to another, initial encounter: Secondary | ICD-10-CM | POA: Diagnosis not present

## 2019-06-22 DIAGNOSIS — M25521 Pain in right elbow: Secondary | ICD-10-CM | POA: Diagnosis not present

## 2019-06-22 DIAGNOSIS — W182XXA Fall in (into) shower or empty bathtub, initial encounter: Secondary | ICD-10-CM | POA: Diagnosis not present

## 2019-06-22 DIAGNOSIS — Y999 Unspecified external cause status: Secondary | ICD-10-CM | POA: Diagnosis not present

## 2019-06-22 DIAGNOSIS — S20419A Abrasion of unspecified back wall of thorax, initial encounter: Secondary | ICD-10-CM | POA: Diagnosis not present

## 2019-06-22 DIAGNOSIS — M79621 Pain in right upper arm: Secondary | ICD-10-CM | POA: Diagnosis not present

## 2019-06-24 DIAGNOSIS — D509 Iron deficiency anemia, unspecified: Secondary | ICD-10-CM | POA: Diagnosis not present

## 2019-06-24 DIAGNOSIS — N186 End stage renal disease: Secondary | ICD-10-CM | POA: Diagnosis not present

## 2019-06-26 DIAGNOSIS — D509 Iron deficiency anemia, unspecified: Secondary | ICD-10-CM | POA: Diagnosis not present

## 2019-06-26 DIAGNOSIS — N186 End stage renal disease: Secondary | ICD-10-CM | POA: Diagnosis not present

## 2019-06-28 DIAGNOSIS — D509 Iron deficiency anemia, unspecified: Secondary | ICD-10-CM | POA: Diagnosis not present

## 2019-06-28 DIAGNOSIS — Z23 Encounter for immunization: Secondary | ICD-10-CM | POA: Diagnosis not present

## 2019-06-28 DIAGNOSIS — D631 Anemia in chronic kidney disease: Secondary | ICD-10-CM | POA: Diagnosis not present

## 2019-06-28 DIAGNOSIS — N186 End stage renal disease: Secondary | ICD-10-CM | POA: Diagnosis not present

## 2019-06-29 DIAGNOSIS — Z89512 Acquired absence of left leg below knee: Secondary | ICD-10-CM | POA: Diagnosis not present

## 2019-06-30 DIAGNOSIS — D509 Iron deficiency anemia, unspecified: Secondary | ICD-10-CM | POA: Diagnosis not present

## 2019-06-30 DIAGNOSIS — D631 Anemia in chronic kidney disease: Secondary | ICD-10-CM | POA: Diagnosis not present

## 2019-06-30 DIAGNOSIS — N186 End stage renal disease: Secondary | ICD-10-CM | POA: Diagnosis not present

## 2019-06-30 DIAGNOSIS — Z23 Encounter for immunization: Secondary | ICD-10-CM | POA: Diagnosis not present

## 2019-07-03 DIAGNOSIS — N186 End stage renal disease: Secondary | ICD-10-CM | POA: Diagnosis not present

## 2019-07-03 DIAGNOSIS — D631 Anemia in chronic kidney disease: Secondary | ICD-10-CM | POA: Diagnosis not present

## 2019-07-03 DIAGNOSIS — D509 Iron deficiency anemia, unspecified: Secondary | ICD-10-CM | POA: Diagnosis not present

## 2019-07-03 DIAGNOSIS — Z23 Encounter for immunization: Secondary | ICD-10-CM | POA: Diagnosis not present

## 2019-07-05 DIAGNOSIS — N186 End stage renal disease: Secondary | ICD-10-CM | POA: Diagnosis not present

## 2019-07-05 DIAGNOSIS — Z23 Encounter for immunization: Secondary | ICD-10-CM | POA: Diagnosis not present

## 2019-07-05 DIAGNOSIS — D509 Iron deficiency anemia, unspecified: Secondary | ICD-10-CM | POA: Diagnosis not present

## 2019-07-05 DIAGNOSIS — D631 Anemia in chronic kidney disease: Secondary | ICD-10-CM | POA: Diagnosis not present

## 2019-07-06 DIAGNOSIS — N186 End stage renal disease: Secondary | ICD-10-CM | POA: Diagnosis not present

## 2019-07-06 DIAGNOSIS — I10 Essential (primary) hypertension: Secondary | ICD-10-CM | POA: Diagnosis not present

## 2019-07-06 DIAGNOSIS — E785 Hyperlipidemia, unspecified: Secondary | ICD-10-CM | POA: Diagnosis not present

## 2019-07-06 DIAGNOSIS — Z89512 Acquired absence of left leg below knee: Secondary | ICD-10-CM | POA: Diagnosis not present

## 2019-07-07 DIAGNOSIS — N186 End stage renal disease: Secondary | ICD-10-CM | POA: Diagnosis not present

## 2019-07-07 DIAGNOSIS — D631 Anemia in chronic kidney disease: Secondary | ICD-10-CM | POA: Diagnosis not present

## 2019-07-07 DIAGNOSIS — Z992 Dependence on renal dialysis: Secondary | ICD-10-CM | POA: Diagnosis not present

## 2019-07-07 DIAGNOSIS — Z23 Encounter for immunization: Secondary | ICD-10-CM | POA: Diagnosis not present

## 2019-07-07 DIAGNOSIS — D509 Iron deficiency anemia, unspecified: Secondary | ICD-10-CM | POA: Diagnosis not present

## 2019-07-10 DIAGNOSIS — D509 Iron deficiency anemia, unspecified: Secondary | ICD-10-CM | POA: Diagnosis not present

## 2019-07-10 DIAGNOSIS — N186 End stage renal disease: Secondary | ICD-10-CM | POA: Diagnosis not present

## 2019-07-11 DIAGNOSIS — E1169 Type 2 diabetes mellitus with other specified complication: Secondary | ICD-10-CM | POA: Diagnosis not present

## 2019-07-11 DIAGNOSIS — Z4781 Encounter for orthopedic aftercare following surgical amputation: Secondary | ICD-10-CM | POA: Diagnosis not present

## 2019-07-11 DIAGNOSIS — Z89512 Acquired absence of left leg below knee: Secondary | ICD-10-CM | POA: Diagnosis not present

## 2019-07-11 DIAGNOSIS — Z794 Long term (current) use of insulin: Secondary | ICD-10-CM | POA: Diagnosis not present

## 2019-07-12 DIAGNOSIS — E119 Type 2 diabetes mellitus without complications: Secondary | ICD-10-CM | POA: Diagnosis not present

## 2019-07-12 DIAGNOSIS — N186 End stage renal disease: Secondary | ICD-10-CM | POA: Diagnosis not present

## 2019-07-12 DIAGNOSIS — D509 Iron deficiency anemia, unspecified: Secondary | ICD-10-CM | POA: Diagnosis not present

## 2019-07-13 DIAGNOSIS — E1169 Type 2 diabetes mellitus with other specified complication: Secondary | ICD-10-CM | POA: Diagnosis not present

## 2019-07-13 DIAGNOSIS — Z794 Long term (current) use of insulin: Secondary | ICD-10-CM | POA: Diagnosis not present

## 2019-07-13 DIAGNOSIS — Z89512 Acquired absence of left leg below knee: Secondary | ICD-10-CM | POA: Diagnosis not present

## 2019-07-13 DIAGNOSIS — Z4781 Encounter for orthopedic aftercare following surgical amputation: Secondary | ICD-10-CM | POA: Diagnosis not present

## 2019-07-14 DIAGNOSIS — N186 End stage renal disease: Secondary | ICD-10-CM | POA: Diagnosis not present

## 2019-07-14 DIAGNOSIS — D509 Iron deficiency anemia, unspecified: Secondary | ICD-10-CM | POA: Diagnosis not present

## 2019-07-17 DIAGNOSIS — N186 End stage renal disease: Secondary | ICD-10-CM | POA: Diagnosis not present

## 2019-07-17 DIAGNOSIS — D509 Iron deficiency anemia, unspecified: Secondary | ICD-10-CM | POA: Diagnosis not present

## 2019-07-18 DIAGNOSIS — Z4781 Encounter for orthopedic aftercare following surgical amputation: Secondary | ICD-10-CM | POA: Diagnosis not present

## 2019-07-18 DIAGNOSIS — Z794 Long term (current) use of insulin: Secondary | ICD-10-CM | POA: Diagnosis not present

## 2019-07-18 DIAGNOSIS — E1169 Type 2 diabetes mellitus with other specified complication: Secondary | ICD-10-CM | POA: Diagnosis not present

## 2019-07-18 DIAGNOSIS — Z89512 Acquired absence of left leg below knee: Secondary | ICD-10-CM | POA: Diagnosis not present

## 2019-07-19 DIAGNOSIS — N186 End stage renal disease: Secondary | ICD-10-CM | POA: Diagnosis not present

## 2019-07-19 DIAGNOSIS — D509 Iron deficiency anemia, unspecified: Secondary | ICD-10-CM | POA: Diagnosis not present

## 2019-07-19 DIAGNOSIS — D631 Anemia in chronic kidney disease: Secondary | ICD-10-CM | POA: Diagnosis not present

## 2019-07-20 DIAGNOSIS — Z794 Long term (current) use of insulin: Secondary | ICD-10-CM | POA: Diagnosis not present

## 2019-07-20 DIAGNOSIS — Z4781 Encounter for orthopedic aftercare following surgical amputation: Secondary | ICD-10-CM | POA: Diagnosis not present

## 2019-07-20 DIAGNOSIS — E1169 Type 2 diabetes mellitus with other specified complication: Secondary | ICD-10-CM | POA: Diagnosis not present

## 2019-07-20 DIAGNOSIS — Z89512 Acquired absence of left leg below knee: Secondary | ICD-10-CM | POA: Diagnosis not present

## 2019-07-21 DIAGNOSIS — D631 Anemia in chronic kidney disease: Secondary | ICD-10-CM | POA: Diagnosis not present

## 2019-07-21 DIAGNOSIS — N186 End stage renal disease: Secondary | ICD-10-CM | POA: Diagnosis not present

## 2019-07-21 DIAGNOSIS — D509 Iron deficiency anemia, unspecified: Secondary | ICD-10-CM | POA: Diagnosis not present

## 2019-07-24 DIAGNOSIS — N186 End stage renal disease: Secondary | ICD-10-CM | POA: Diagnosis not present

## 2019-07-24 DIAGNOSIS — D631 Anemia in chronic kidney disease: Secondary | ICD-10-CM | POA: Diagnosis not present

## 2019-07-24 DIAGNOSIS — D509 Iron deficiency anemia, unspecified: Secondary | ICD-10-CM | POA: Diagnosis not present

## 2019-07-25 DIAGNOSIS — Z794 Long term (current) use of insulin: Secondary | ICD-10-CM | POA: Diagnosis not present

## 2019-07-25 DIAGNOSIS — Z89512 Acquired absence of left leg below knee: Secondary | ICD-10-CM | POA: Diagnosis not present

## 2019-07-25 DIAGNOSIS — Z4781 Encounter for orthopedic aftercare following surgical amputation: Secondary | ICD-10-CM | POA: Diagnosis not present

## 2019-07-25 DIAGNOSIS — E1169 Type 2 diabetes mellitus with other specified complication: Secondary | ICD-10-CM | POA: Diagnosis not present

## 2019-07-26 DIAGNOSIS — N186 End stage renal disease: Secondary | ICD-10-CM | POA: Diagnosis not present

## 2019-07-26 DIAGNOSIS — D509 Iron deficiency anemia, unspecified: Secondary | ICD-10-CM | POA: Diagnosis not present

## 2019-07-26 DIAGNOSIS — D631 Anemia in chronic kidney disease: Secondary | ICD-10-CM | POA: Diagnosis not present

## 2019-07-27 DIAGNOSIS — Z794 Long term (current) use of insulin: Secondary | ICD-10-CM | POA: Diagnosis not present

## 2019-07-27 DIAGNOSIS — Z4781 Encounter for orthopedic aftercare following surgical amputation: Secondary | ICD-10-CM | POA: Diagnosis not present

## 2019-07-27 DIAGNOSIS — Z89512 Acquired absence of left leg below knee: Secondary | ICD-10-CM | POA: Diagnosis not present

## 2019-07-27 DIAGNOSIS — E1169 Type 2 diabetes mellitus with other specified complication: Secondary | ICD-10-CM | POA: Diagnosis not present

## 2019-07-28 DIAGNOSIS — Z23 Encounter for immunization: Secondary | ICD-10-CM | POA: Diagnosis not present

## 2019-07-28 DIAGNOSIS — D631 Anemia in chronic kidney disease: Secondary | ICD-10-CM | POA: Diagnosis not present

## 2019-07-28 DIAGNOSIS — N186 End stage renal disease: Secondary | ICD-10-CM | POA: Diagnosis not present

## 2019-07-31 DIAGNOSIS — D631 Anemia in chronic kidney disease: Secondary | ICD-10-CM | POA: Diagnosis not present

## 2019-07-31 DIAGNOSIS — Z23 Encounter for immunization: Secondary | ICD-10-CM | POA: Diagnosis not present

## 2019-07-31 DIAGNOSIS — N186 End stage renal disease: Secondary | ICD-10-CM | POA: Diagnosis not present

## 2019-08-01 ENCOUNTER — Encounter: Payer: Self-pay | Admitting: Internal Medicine

## 2019-08-01 ENCOUNTER — Ambulatory Visit (INDEPENDENT_AMBULATORY_CARE_PROVIDER_SITE_OTHER): Payer: BC Managed Care – PPO | Admitting: Internal Medicine

## 2019-08-01 ENCOUNTER — Other Ambulatory Visit: Payer: Self-pay

## 2019-08-01 VITALS — BP 90/60 | HR 108 | Ht 71.0 in | Wt 301.5 lb

## 2019-08-01 DIAGNOSIS — E1122 Type 2 diabetes mellitus with diabetic chronic kidney disease: Secondary | ICD-10-CM

## 2019-08-01 DIAGNOSIS — E785 Hyperlipidemia, unspecified: Secondary | ICD-10-CM | POA: Diagnosis not present

## 2019-08-01 LAB — POCT GLYCOSYLATED HEMOGLOBIN (HGB A1C): Hemoglobin A1C: 8.2 % — AB (ref 4.0–5.6)

## 2019-08-01 NOTE — Patient Instructions (Addendum)
Please continue: - Basaglar 36 units but move it at lunchtime - Ozempic 1 mg weekly on Sundays - Glipizide 5 mg before b'fast (only in non-dialysis days) and before dinner   Please come back for a follow-up appointment in 3 months.

## 2019-08-01 NOTE — Progress Notes (Signed)
Patient ID: Reginald Carpenter, male   DOB: Jan 06, 1966, 54 y.o.   MRN: [id number]   This visit occurred during the SARS-CoV-2 public health emergency.  Safety protocols were in place, including screening questions prior to the visit, additional usage of staff PPE, and extensive cleaning of exam room while observing appropriate contact time as indicated for disinfecting solutions.   HPI: Reginald Carpenter is a 54 y.o.-year-oldyear-old male, initially referred by his PCP, Jarrett Soho, PA, presenting for f/u for DM2, dx in ~2000, prev. insulin-dependent since 2017, uncontrolled, with complications (CKD, PN - + Charcot foot L ankle, DR). Last visit 3 months ago.   He is having hemodialysis MWF at 6 AM. Rehab on TT - now has a prosthesis on his left leg and he is more mobile. Sugars improved.  Reviewed HbA1c levels: Lab Results  Component Value Date   HGBA1C 9.1 (A) 05/02/2019   HGBA1C 9.5 (A) 01/27/2019   HGBA1C 9.5 (A) 10/26/2018  10/22/2016: HbA1c 9.2%  Pt is on a regimen of: - Basaglar 25 >> 28 >> 36 units at bedtime >> in am >> in the evening -misses doses as he goes to bed early - Ozempic 0.5 mg weekly-started 03/2018 >> 1 mg weekly on Sundays - Glipizide 5 mg before B and D We stopped Amaryl in 03/2018. Stopped Metformin 1000 mg 2x a day, with meals due to poor kidney function He was previously on Amaryl 2 mg before dinner - started 10/2016, stopped 01/2014 due to lows  Pt checks his sugars 1-2 times a day: - am: 150-180 >> 200-230 >> 156-221, 275 >> 152-229 (forgot insulin) - 2h after b'fast: n/c  - before lunch: 115-120 >> n/c >> 180-210 >> 156-215 >> 152 - 2h after lunch: n/c - before dinner: 150s (no snacks) >> n/c  >> 165-212 >> 167 - 2h after dinner: 126-143 >> n/c >> 262 >> 188-197, 230 - bedtime: 180-230 >> 180-200, 310 >> 92, 178-210, 240 - nighttime: n/c Lowest sugar was 90s >> 93 >> 130 >> 150 >> 92; hypoglycemia awareness in the 60s. Highest sugar was 210 >> 210 >> 310 >> 275 >>  230  Glucometer: [employer]  Pt's meals are: - Breakfast: cereal; sausage and eggs - Lunch: ham and cheese sandwich, salads, pot pie - Dinner: salad, spaghetti, grilled chicken and steamed veggies - Snacks: cottage cheese He was drinking regular sodas but stopped in 2018.  -+ ESRD. 04/02/2019: GFR 12 10/24/2018: 38/1.0 12/17/2017: 65/4.75 10/15/2017: 57/3.87, GFR 17 07/01/2017: ?/2.88, GFR 24, Protein/creatinie ratio: 13,978 05/14/2017: ?/2.42, GFR 30 10/22/2016: 44/1.84, GFR 39, glucose 182  No results found for: BUN, CREATININE  On lisinopril 20.  -+ HL; last set of lipids: 10/24/2018: 202/260/37/134 12/17/2017: 205/182/35/134 10/22/2016: 208/236/45/115 No results found for: CHOL, HDL, LDLCALC, LDLDIRECT, TRIG, CHOLHDL  On Crestor 10.  - last eye exam was in 2020: +-getting IO injections.  He had bilateral cataract surgeries.  -He has numbness and tingling in his feet.  He had Charcot foot surgery in 05/2017 had to have bone graft surgery.   Pt has FH of DM in mother, sister (GDM).  He also has a history of hypertension, but this improved so he stopped antihypertensive medication.  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   He is on disability.  ROS: Constitutional: no weight gain/no weight loss, no fatigue, no subjective hyperthermia, no subjective hypothermia Eyes: no blurry vision, no xerophthalmia ENT: no sore throat, no nodules palpated in neck, no dysphagia, no odynophagia, no hoarseness Cardiovascular: no CP/no  SOB/no palpitations/no leg swelling Respiratory: no cough/no SOB/no wheezing Gastrointestinal: no N/no V/no D/no C/no acid reflux Musculoskeletal: no muscle aches/no joint aches Skin: no rashes, no hair loss Neurological: no tremors/+ numbness/+ tingling/no dizziness  I reviewed pt's medications, allergies, PMH, social hx, family hx, and changes were documented in the history of present illness. Otherwise, unchanged from my initial visit note.   Past Medical  History:  Diagnosis Date  . 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 Chronic kidney disease   . Diabetes mellitus without complication (HCC)    type 2  . High cholesterol   . Hypertension   . Neuropathy    Past Surgical History:  Procedure Laterality Date  . ANKLE SURGERY     multiple ankle surgeries, with hardware and hardware removal at [facility]  . AV FISTULA PLACEMENT Right 11/09/2017   Procedure: Creation of Right Upper Arm Brachiocephalic ARTERIOVENOUS (AV) FISTULA;  Surgeon: Chuck Hint, MD;  Location: [facility] OR;  Service: Vascular;  Laterality: Right;  . CARPAL TUNNEL RELEASE Bilateral   . COLONOSCOPY     Social History   Social History  . Marital status: Married    Spouse name: N/A  . Number of children: 2   Occupational History  . Out of work   Social History Main Topics  . Smoking status: Former Smoker - quit 2004    Years: 10.00  . Smokeless tobacco: Never Used  . Alcohol use No  . Drug use: No   Current Outpatient Medications on File Prior to Visit  Medication Sig Dispense Refill  . BD PEN NEEDLE NANO 2ND GEN 32G X 4 MM MISC 1 EACH BY DOES NOT APPLY ROUTE DAILY. WITH BASAGLAR PEN 100 each 11  . calcium acetate (PHOSLO) 667 MG capsule Take 667 mg by mouth 3 (three) times daily with meals.     . gabapentin (NEURONTIN) 100 MG capsule Take 100 mg by mouth 2 (two) times daily.    Marland Kitchen glipiZIDE (GLUCOTROL) 5 MG tablet Take 1 tablet (5 mg total) by mouth 2 (two) times daily before a meal. 180 tablet 3  . Insulin Glargine (BASAGLAR KWIKPEN) 100 UNIT/ML SOPN INJECT 28 UNITS INTO THE SKIN AT BEDTIME. 45 mL 3  . rosuvastatin (CRESTOR) 10 MG tablet Take 10 mg by mouth daily.    . Semaglutide, 1 MG/DOSE, (OZEMPIC, 1 MG/DOSE,) 2 MG/1.5ML SOPN Inject 1 mg into the skin once a week. 6 pen 3  . sodium bicarbonate 650 MG tablet Take 1,300 mg by mouth 2 (two) times daily.     . Vitamin D, Ergocalciferol, (DRISDOL) 50000 units CAPS capsule Take 50,000  Units by mouth every Monday.   0   No current facility-administered medications on file prior to visit.   No Known Allergies Family History  Problem Relation Age of Onset  . Heart failure Mother    Also; HTN, HL, heart ds, lung CA in Mother  PE: BP 90/60 (BP Location: Left Arm, Patient Position: Sitting, Cuff Size: Large)   Pulse (!) 108   Ht 5\' 11"  (1.803 m)   Wt (!) 301 lb 8 oz (136.8 kg)   SpO2 96%   BMI 42.05 kg/m  Wt Readings from Last 3 Encounters:  08/01/19 (!) 301 lb 8 oz (136.8 kg)  05/02/19 287 lb (130.2 kg)  01/27/19 (!) 310 lb (140.6 kg)   Constitutional: overweight, in NAD Eyes: PERRLA, EOMI, no exophthalmos ENT: moist mucous membranes, no thyromegaly, no cervical  lymphadenopathy Cardiovascular: RRR, No MRG Respiratory: CTA B Gastrointestinal: abdomen soft, NT, ND, BS+ Musculoskeletal: +deformities: L BKA;  strength intact in all 4 Skin: moist, warm, no rashes Neurological: no tremor with outstretched hands, DTR normal in all 4  ASSESSMENT: 1. DM2, insulin-dependent, uncontrolled, with complications - PN - DR - CKD with proteinuria  - sees nephrology. 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  2. HL  3.  Obesity class III  PLAN:  1. Patient with longstanding, uncontrolled, type 2 diabetes, on basal insulin and GLP-1 receptor agonist.  Sugars improved, plant-based diet in the past, without concentrated sweets, and we were able to stop the sulfonylurea.  However, sugars increased at last visit and we added back glipizide.  At that time sugars were variable but worse in the previous 2 months.  Upon questioning, he was moved from TTS dialysis to MWF.  He was still taking Ozempic on Monday mornings and I was afraid that some of it may be dialyzed.  I advised him to move Ozempic on Sundays. -Of note, he needs to lose a significant  amount of weight to be considered for renal transplant.  He was planning to see a weight loss specialist at [facility] but did not do so yet -At this visit, sugars are slightly better but he still has spikes in the 200s especially when he forgets [place]. He tells me that he usually forgets it in the night after he has dialysis as he goes to bed early. We discussed at this visit to move the Basaglar dose at lunchtime. She also occasionally forgets glipizide at night and we discussed about the importance of taking this to avoid high blood sugars in the evening and subsequently in the morning. He is not taking Ozempic on Sundays and we will continue this. - I suggested to:  Patient Instructions  Please continue: - Basaglar 36 units but move it at lunchtime - Ozempic 1 mg weekly on Sundays - Glipizide 5 mg before b'fast (only in non-dialysis days) and before dinner   Please come back for a follow-up appointment in 3 months.  - we checked his HbA1c: 8.2% (improved) - advised to check sugars at different times of the day - 1x a day, rotating check times - advised for yearly eye exams >> he is UTD - return to clinic in 3 months  2. HL -Reviewed latest lipid panel from 10/2018: LDL above goal, triglycerides high -Continues Crestor without side effects.  This was changed from Lipitor when the above results returned.  3.  Obesity class III -Continue Ozempic which should also help with weight loss -He lost a significant amount of weight before last visit, possibly due to fluid loss as he decrease his dry weight. -He gained 14 pounds back since last visit  Philemon Kingdom, MD PhD [facility] Endocrinology

## 2019-08-03 DIAGNOSIS — Z89512 Acquired absence of left leg below knee: Secondary | ICD-10-CM | POA: Diagnosis not present

## 2019-08-03 DIAGNOSIS — Z4781 Encounter for orthopedic aftercare following surgical amputation: Secondary | ICD-10-CM | POA: Diagnosis not present

## 2019-08-03 DIAGNOSIS — E1169 Type 2 diabetes mellitus with other specified complication: Secondary | ICD-10-CM | POA: Diagnosis not present

## 2019-08-03 DIAGNOSIS — Z794 Long term (current) use of insulin: Secondary | ICD-10-CM | POA: Diagnosis not present

## 2019-08-04 DIAGNOSIS — D631 Anemia in chronic kidney disease: Secondary | ICD-10-CM | POA: Diagnosis not present

## 2019-08-04 DIAGNOSIS — N186 End stage renal disease: Secondary | ICD-10-CM | POA: Diagnosis not present

## 2019-08-04 DIAGNOSIS — Z23 Encounter for immunization: Secondary | ICD-10-CM | POA: Diagnosis not present

## 2019-08-05 DIAGNOSIS — E785 Hyperlipidemia, unspecified: Secondary | ICD-10-CM | POA: Diagnosis not present

## 2019-08-05 DIAGNOSIS — N186 End stage renal disease: Secondary | ICD-10-CM | POA: Diagnosis not present

## 2019-08-05 DIAGNOSIS — I10 Essential (primary) hypertension: Secondary | ICD-10-CM | POA: Diagnosis not present

## 2019-08-05 DIAGNOSIS — Z89512 Acquired absence of left leg below knee: Secondary | ICD-10-CM | POA: Diagnosis not present

## 2019-08-07 DIAGNOSIS — Z23 Encounter for immunization: Secondary | ICD-10-CM | POA: Diagnosis not present

## 2019-08-07 DIAGNOSIS — Z992 Dependence on renal dialysis: Secondary | ICD-10-CM | POA: Diagnosis not present

## 2019-08-07 DIAGNOSIS — N186 End stage renal disease: Secondary | ICD-10-CM | POA: Diagnosis not present

## 2019-08-07 DIAGNOSIS — D631 Anemia in chronic kidney disease: Secondary | ICD-10-CM | POA: Diagnosis not present

## 2019-08-08 DIAGNOSIS — E1169 Type 2 diabetes mellitus with other specified complication: Secondary | ICD-10-CM | POA: Diagnosis not present

## 2019-08-08 DIAGNOSIS — Z4781 Encounter for orthopedic aftercare following surgical amputation: Secondary | ICD-10-CM | POA: Diagnosis not present

## 2019-08-08 DIAGNOSIS — Z89512 Acquired absence of left leg below knee: Secondary | ICD-10-CM | POA: Diagnosis not present

## 2019-08-08 DIAGNOSIS — Z44122 Encounter for fitting and adjustment of partial artificial left leg: Secondary | ICD-10-CM | POA: Diagnosis not present

## 2019-08-09 DIAGNOSIS — N186 End stage renal disease: Secondary | ICD-10-CM | POA: Diagnosis not present

## 2019-08-09 DIAGNOSIS — D509 Iron deficiency anemia, unspecified: Secondary | ICD-10-CM | POA: Diagnosis not present

## 2019-08-09 DIAGNOSIS — E119 Type 2 diabetes mellitus without complications: Secondary | ICD-10-CM | POA: Diagnosis not present

## 2019-08-09 DIAGNOSIS — D631 Anemia in chronic kidney disease: Secondary | ICD-10-CM | POA: Diagnosis not present

## 2019-08-10 DIAGNOSIS — Z89512 Acquired absence of left leg below knee: Secondary | ICD-10-CM | POA: Diagnosis not present

## 2019-08-10 DIAGNOSIS — E1169 Type 2 diabetes mellitus with other specified complication: Secondary | ICD-10-CM | POA: Diagnosis not present

## 2019-08-10 DIAGNOSIS — Z44122 Encounter for fitting and adjustment of partial artificial left leg: Secondary | ICD-10-CM | POA: Diagnosis not present

## 2019-08-10 DIAGNOSIS — Z4781 Encounter for orthopedic aftercare following surgical amputation: Secondary | ICD-10-CM | POA: Diagnosis not present

## 2019-08-11 DIAGNOSIS — N186 End stage renal disease: Secondary | ICD-10-CM | POA: Diagnosis not present

## 2019-08-11 DIAGNOSIS — E113413 Type 2 diabetes mellitus with severe nonproliferative diabetic retinopathy with macular edema, bilateral: Secondary | ICD-10-CM | POA: Diagnosis not present

## 2019-08-11 DIAGNOSIS — D509 Iron deficiency anemia, unspecified: Secondary | ICD-10-CM | POA: Diagnosis not present

## 2019-08-11 DIAGNOSIS — D631 Anemia in chronic kidney disease: Secondary | ICD-10-CM | POA: Diagnosis not present

## 2019-08-14 DIAGNOSIS — D509 Iron deficiency anemia, unspecified: Secondary | ICD-10-CM | POA: Diagnosis not present

## 2019-08-14 DIAGNOSIS — D631 Anemia in chronic kidney disease: Secondary | ICD-10-CM | POA: Diagnosis not present

## 2019-08-14 DIAGNOSIS — N186 End stage renal disease: Secondary | ICD-10-CM | POA: Diagnosis not present

## 2019-08-15 DIAGNOSIS — Z89512 Acquired absence of left leg below knee: Secondary | ICD-10-CM | POA: Diagnosis not present

## 2019-08-15 DIAGNOSIS — Z44122 Encounter for fitting and adjustment of partial artificial left leg: Secondary | ICD-10-CM | POA: Diagnosis not present

## 2019-08-15 DIAGNOSIS — Z4781 Encounter for orthopedic aftercare following surgical amputation: Secondary | ICD-10-CM | POA: Diagnosis not present

## 2019-08-15 DIAGNOSIS — E1169 Type 2 diabetes mellitus with other specified complication: Secondary | ICD-10-CM | POA: Diagnosis not present

## 2019-08-16 DIAGNOSIS — D631 Anemia in chronic kidney disease: Secondary | ICD-10-CM | POA: Diagnosis not present

## 2019-08-16 DIAGNOSIS — D509 Iron deficiency anemia, unspecified: Secondary | ICD-10-CM | POA: Diagnosis not present

## 2019-08-16 DIAGNOSIS — N186 End stage renal disease: Secondary | ICD-10-CM | POA: Diagnosis not present

## 2019-08-17 DIAGNOSIS — Z44122 Encounter for fitting and adjustment of partial artificial left leg: Secondary | ICD-10-CM | POA: Diagnosis not present

## 2019-08-17 DIAGNOSIS — Z89512 Acquired absence of left leg below knee: Secondary | ICD-10-CM | POA: Diagnosis not present

## 2019-08-17 DIAGNOSIS — E1169 Type 2 diabetes mellitus with other specified complication: Secondary | ICD-10-CM | POA: Diagnosis not present

## 2019-08-17 DIAGNOSIS — Z4781 Encounter for orthopedic aftercare following surgical amputation: Secondary | ICD-10-CM | POA: Diagnosis not present

## 2019-08-18 DIAGNOSIS — N186 End stage renal disease: Secondary | ICD-10-CM | POA: Diagnosis not present

## 2019-08-18 DIAGNOSIS — D631 Anemia in chronic kidney disease: Secondary | ICD-10-CM | POA: Diagnosis not present

## 2019-08-18 DIAGNOSIS — D509 Iron deficiency anemia, unspecified: Secondary | ICD-10-CM | POA: Diagnosis not present

## 2019-08-21 DIAGNOSIS — D509 Iron deficiency anemia, unspecified: Secondary | ICD-10-CM | POA: Diagnosis not present

## 2019-08-21 DIAGNOSIS — N186 End stage renal disease: Secondary | ICD-10-CM | POA: Diagnosis not present

## 2019-08-21 DIAGNOSIS — D631 Anemia in chronic kidney disease: Secondary | ICD-10-CM | POA: Diagnosis not present

## 2019-08-23 DIAGNOSIS — Z44122 Encounter for fitting and adjustment of partial artificial left leg: Secondary | ICD-10-CM | POA: Diagnosis not present

## 2019-08-23 DIAGNOSIS — E1169 Type 2 diabetes mellitus with other specified complication: Secondary | ICD-10-CM | POA: Diagnosis not present

## 2019-08-23 DIAGNOSIS — D509 Iron deficiency anemia, unspecified: Secondary | ICD-10-CM | POA: Diagnosis not present

## 2019-08-23 DIAGNOSIS — Z89512 Acquired absence of left leg below knee: Secondary | ICD-10-CM | POA: Diagnosis not present

## 2019-08-23 DIAGNOSIS — Z4781 Encounter for orthopedic aftercare following surgical amputation: Secondary | ICD-10-CM | POA: Diagnosis not present

## 2019-08-23 DIAGNOSIS — D631 Anemia in chronic kidney disease: Secondary | ICD-10-CM | POA: Diagnosis not present

## 2019-08-23 DIAGNOSIS — N186 End stage renal disease: Secondary | ICD-10-CM | POA: Diagnosis not present

## 2019-08-25 DIAGNOSIS — D509 Iron deficiency anemia, unspecified: Secondary | ICD-10-CM | POA: Diagnosis not present

## 2019-08-25 DIAGNOSIS — D631 Anemia in chronic kidney disease: Secondary | ICD-10-CM | POA: Diagnosis not present

## 2019-08-25 DIAGNOSIS — N186 End stage renal disease: Secondary | ICD-10-CM | POA: Diagnosis not present

## 2019-08-28 DIAGNOSIS — N186 End stage renal disease: Secondary | ICD-10-CM | POA: Diagnosis not present

## 2019-08-29 DIAGNOSIS — Z89512 Acquired absence of left leg below knee: Secondary | ICD-10-CM | POA: Diagnosis not present

## 2019-08-29 DIAGNOSIS — Z44122 Encounter for fitting and adjustment of partial artificial left leg: Secondary | ICD-10-CM | POA: Diagnosis not present

## 2019-08-29 DIAGNOSIS — Z4781 Encounter for orthopedic aftercare following surgical amputation: Secondary | ICD-10-CM | POA: Diagnosis not present

## 2019-08-29 DIAGNOSIS — E1169 Type 2 diabetes mellitus with other specified complication: Secondary | ICD-10-CM | POA: Diagnosis not present

## 2019-08-30 DIAGNOSIS — N186 End stage renal disease: Secondary | ICD-10-CM | POA: Diagnosis not present

## 2019-09-01 DIAGNOSIS — N186 End stage renal disease: Secondary | ICD-10-CM | POA: Diagnosis not present

## 2019-09-04 ENCOUNTER — Other Ambulatory Visit: Payer: Self-pay | Admitting: Internal Medicine

## 2019-09-04 DIAGNOSIS — N186 End stage renal disease: Secondary | ICD-10-CM | POA: Diagnosis not present

## 2019-09-05 DIAGNOSIS — N186 End stage renal disease: Secondary | ICD-10-CM | POA: Diagnosis not present

## 2019-09-05 DIAGNOSIS — I10 Essential (primary) hypertension: Secondary | ICD-10-CM | POA: Diagnosis not present

## 2019-09-05 DIAGNOSIS — Z89512 Acquired absence of left leg below knee: Secondary | ICD-10-CM | POA: Diagnosis not present

## 2019-09-05 DIAGNOSIS — E785 Hyperlipidemia, unspecified: Secondary | ICD-10-CM | POA: Diagnosis not present

## 2019-09-06 DIAGNOSIS — N186 End stage renal disease: Secondary | ICD-10-CM | POA: Diagnosis not present

## 2019-09-06 DIAGNOSIS — Z992 Dependence on renal dialysis: Secondary | ICD-10-CM | POA: Diagnosis not present

## 2019-09-07 DIAGNOSIS — Z89512 Acquired absence of left leg below knee: Secondary | ICD-10-CM | POA: Diagnosis not present

## 2019-09-08 DIAGNOSIS — N186 End stage renal disease: Secondary | ICD-10-CM | POA: Diagnosis not present

## 2019-09-08 DIAGNOSIS — D509 Iron deficiency anemia, unspecified: Secondary | ICD-10-CM | POA: Diagnosis not present

## 2019-09-08 DIAGNOSIS — N2581 Secondary hyperparathyroidism of renal origin: Secondary | ICD-10-CM | POA: Diagnosis not present

## 2019-09-11 DIAGNOSIS — D509 Iron deficiency anemia, unspecified: Secondary | ICD-10-CM | POA: Diagnosis not present

## 2019-09-11 DIAGNOSIS — N186 End stage renal disease: Secondary | ICD-10-CM | POA: Diagnosis not present

## 2019-09-11 DIAGNOSIS — N2581 Secondary hyperparathyroidism of renal origin: Secondary | ICD-10-CM | POA: Diagnosis not present

## 2019-09-12 DIAGNOSIS — E113412 Type 2 diabetes mellitus with severe nonproliferative diabetic retinopathy with macular edema, left eye: Secondary | ICD-10-CM | POA: Diagnosis not present

## 2019-09-12 DIAGNOSIS — Z89512 Acquired absence of left leg below knee: Secondary | ICD-10-CM | POA: Diagnosis not present

## 2019-09-13 DIAGNOSIS — N186 End stage renal disease: Secondary | ICD-10-CM | POA: Diagnosis not present

## 2019-09-13 DIAGNOSIS — E119 Type 2 diabetes mellitus without complications: Secondary | ICD-10-CM | POA: Diagnosis not present

## 2019-09-13 DIAGNOSIS — N2581 Secondary hyperparathyroidism of renal origin: Secondary | ICD-10-CM | POA: Diagnosis not present

## 2019-09-13 DIAGNOSIS — D509 Iron deficiency anemia, unspecified: Secondary | ICD-10-CM | POA: Diagnosis not present

## 2019-09-14 DIAGNOSIS — Z89512 Acquired absence of left leg below knee: Secondary | ICD-10-CM | POA: Diagnosis not present

## 2019-09-15 DIAGNOSIS — D509 Iron deficiency anemia, unspecified: Secondary | ICD-10-CM | POA: Diagnosis not present

## 2019-09-15 DIAGNOSIS — N2581 Secondary hyperparathyroidism of renal origin: Secondary | ICD-10-CM | POA: Diagnosis not present

## 2019-09-15 DIAGNOSIS — N186 End stage renal disease: Secondary | ICD-10-CM | POA: Diagnosis not present

## 2019-09-18 DIAGNOSIS — D509 Iron deficiency anemia, unspecified: Secondary | ICD-10-CM | POA: Diagnosis not present

## 2019-09-18 DIAGNOSIS — D631 Anemia in chronic kidney disease: Secondary | ICD-10-CM | POA: Diagnosis not present

## 2019-09-18 DIAGNOSIS — N186 End stage renal disease: Secondary | ICD-10-CM | POA: Diagnosis not present

## 2019-09-19 DIAGNOSIS — Z89512 Acquired absence of left leg below knee: Secondary | ICD-10-CM | POA: Diagnosis not present

## 2019-09-20 DIAGNOSIS — N186 End stage renal disease: Secondary | ICD-10-CM | POA: Diagnosis not present

## 2019-09-20 DIAGNOSIS — D509 Iron deficiency anemia, unspecified: Secondary | ICD-10-CM | POA: Diagnosis not present

## 2019-09-20 DIAGNOSIS — D631 Anemia in chronic kidney disease: Secondary | ICD-10-CM | POA: Diagnosis not present

## 2019-09-21 DIAGNOSIS — Z89512 Acquired absence of left leg below knee: Secondary | ICD-10-CM | POA: Diagnosis not present

## 2019-09-22 DIAGNOSIS — D509 Iron deficiency anemia, unspecified: Secondary | ICD-10-CM | POA: Diagnosis not present

## 2019-09-22 DIAGNOSIS — D631 Anemia in chronic kidney disease: Secondary | ICD-10-CM | POA: Diagnosis not present

## 2019-09-22 DIAGNOSIS — N186 End stage renal disease: Secondary | ICD-10-CM | POA: Diagnosis not present

## 2019-09-25 DIAGNOSIS — D509 Iron deficiency anemia, unspecified: Secondary | ICD-10-CM | POA: Diagnosis not present

## 2019-09-25 DIAGNOSIS — D631 Anemia in chronic kidney disease: Secondary | ICD-10-CM | POA: Diagnosis not present

## 2019-09-25 DIAGNOSIS — N186 End stage renal disease: Secondary | ICD-10-CM | POA: Diagnosis not present

## 2019-09-26 DIAGNOSIS — Z89512 Acquired absence of left leg below knee: Secondary | ICD-10-CM | POA: Diagnosis not present

## 2019-09-27 DIAGNOSIS — N186 End stage renal disease: Secondary | ICD-10-CM | POA: Diagnosis not present

## 2019-09-27 DIAGNOSIS — D509 Iron deficiency anemia, unspecified: Secondary | ICD-10-CM | POA: Diagnosis not present

## 2019-09-27 DIAGNOSIS — Z23 Encounter for immunization: Secondary | ICD-10-CM | POA: Diagnosis not present

## 2019-09-28 DIAGNOSIS — Z89512 Acquired absence of left leg below knee: Secondary | ICD-10-CM | POA: Diagnosis not present

## 2019-09-29 DIAGNOSIS — N186 End stage renal disease: Secondary | ICD-10-CM | POA: Diagnosis not present

## 2019-09-29 DIAGNOSIS — D509 Iron deficiency anemia, unspecified: Secondary | ICD-10-CM | POA: Diagnosis not present

## 2019-09-29 DIAGNOSIS — Z23 Encounter for immunization: Secondary | ICD-10-CM | POA: Diagnosis not present

## 2019-10-02 DIAGNOSIS — D509 Iron deficiency anemia, unspecified: Secondary | ICD-10-CM | POA: Diagnosis not present

## 2019-10-02 DIAGNOSIS — Z23 Encounter for immunization: Secondary | ICD-10-CM | POA: Diagnosis not present

## 2019-10-02 DIAGNOSIS — N186 End stage renal disease: Secondary | ICD-10-CM | POA: Diagnosis not present

## 2019-10-04 DIAGNOSIS — N186 End stage renal disease: Secondary | ICD-10-CM | POA: Diagnosis not present

## 2019-10-04 DIAGNOSIS — D509 Iron deficiency anemia, unspecified: Secondary | ICD-10-CM | POA: Diagnosis not present

## 2019-10-04 DIAGNOSIS — Z23 Encounter for immunization: Secondary | ICD-10-CM | POA: Diagnosis not present

## 2019-10-05 DIAGNOSIS — Z794 Long term (current) use of insulin: Secondary | ICD-10-CM | POA: Diagnosis not present

## 2019-10-05 DIAGNOSIS — Z89512 Acquired absence of left leg below knee: Secondary | ICD-10-CM | POA: Diagnosis not present

## 2019-10-05 DIAGNOSIS — E1169 Type 2 diabetes mellitus with other specified complication: Secondary | ICD-10-CM | POA: Diagnosis not present

## 2019-10-05 DIAGNOSIS — Z4781 Encounter for orthopedic aftercare following surgical amputation: Secondary | ICD-10-CM | POA: Diagnosis not present

## 2019-10-06 DIAGNOSIS — D509 Iron deficiency anemia, unspecified: Secondary | ICD-10-CM | POA: Diagnosis not present

## 2019-10-06 DIAGNOSIS — N186 End stage renal disease: Secondary | ICD-10-CM | POA: Diagnosis not present

## 2019-10-06 DIAGNOSIS — Z23 Encounter for immunization: Secondary | ICD-10-CM | POA: Diagnosis not present

## 2019-10-07 DIAGNOSIS — N186 End stage renal disease: Secondary | ICD-10-CM | POA: Diagnosis not present

## 2019-10-07 DIAGNOSIS — Z992 Dependence on renal dialysis: Secondary | ICD-10-CM | POA: Diagnosis not present

## 2019-10-09 DIAGNOSIS — N186 End stage renal disease: Secondary | ICD-10-CM | POA: Diagnosis not present

## 2019-10-09 DIAGNOSIS — D509 Iron deficiency anemia, unspecified: Secondary | ICD-10-CM | POA: Diagnosis not present

## 2019-10-10 DIAGNOSIS — Z4781 Encounter for orthopedic aftercare following surgical amputation: Secondary | ICD-10-CM | POA: Diagnosis not present

## 2019-10-11 DIAGNOSIS — N186 End stage renal disease: Secondary | ICD-10-CM | POA: Diagnosis not present

## 2019-10-11 DIAGNOSIS — D509 Iron deficiency anemia, unspecified: Secondary | ICD-10-CM | POA: Diagnosis not present

## 2019-10-11 DIAGNOSIS — E119 Type 2 diabetes mellitus without complications: Secondary | ICD-10-CM | POA: Diagnosis not present

## 2019-10-12 DIAGNOSIS — Z4781 Encounter for orthopedic aftercare following surgical amputation: Secondary | ICD-10-CM | POA: Diagnosis not present

## 2019-10-13 DIAGNOSIS — N186 End stage renal disease: Secondary | ICD-10-CM | POA: Diagnosis not present

## 2019-10-13 DIAGNOSIS — D509 Iron deficiency anemia, unspecified: Secondary | ICD-10-CM | POA: Diagnosis not present

## 2019-10-16 DIAGNOSIS — D509 Iron deficiency anemia, unspecified: Secondary | ICD-10-CM | POA: Diagnosis not present

## 2019-10-16 DIAGNOSIS — N186 End stage renal disease: Secondary | ICD-10-CM | POA: Diagnosis not present

## 2019-10-17 DIAGNOSIS — Z4781 Encounter for orthopedic aftercare following surgical amputation: Secondary | ICD-10-CM | POA: Diagnosis not present

## 2019-10-18 DIAGNOSIS — D631 Anemia in chronic kidney disease: Secondary | ICD-10-CM | POA: Diagnosis not present

## 2019-10-18 DIAGNOSIS — D509 Iron deficiency anemia, unspecified: Secondary | ICD-10-CM | POA: Diagnosis not present

## 2019-10-18 DIAGNOSIS — N186 End stage renal disease: Secondary | ICD-10-CM | POA: Diagnosis not present

## 2019-10-19 DIAGNOSIS — S46911A Strain of unspecified muscle, fascia and tendon at shoulder and upper arm level, right arm, initial encounter: Secondary | ICD-10-CM | POA: Diagnosis not present

## 2019-10-19 DIAGNOSIS — G8911 Acute pain due to trauma: Secondary | ICD-10-CM | POA: Diagnosis not present

## 2019-10-19 DIAGNOSIS — W01198A Fall on same level from slipping, tripping and stumbling with subsequent striking against other object, initial encounter: Secondary | ICD-10-CM | POA: Diagnosis not present

## 2019-10-19 DIAGNOSIS — Z992 Dependence on renal dialysis: Secondary | ICD-10-CM | POA: Diagnosis not present

## 2019-10-19 DIAGNOSIS — M19011 Primary osteoarthritis, right shoulder: Secondary | ICD-10-CM | POA: Diagnosis not present

## 2019-10-19 DIAGNOSIS — E1122 Type 2 diabetes mellitus with diabetic chronic kidney disease: Secondary | ICD-10-CM | POA: Diagnosis not present

## 2019-10-19 DIAGNOSIS — Z87891 Personal history of nicotine dependence: Secondary | ICD-10-CM | POA: Diagnosis not present

## 2019-10-19 DIAGNOSIS — Z794 Long term (current) use of insulin: Secondary | ICD-10-CM | POA: Diagnosis not present

## 2019-10-19 DIAGNOSIS — Z79899 Other long term (current) drug therapy: Secondary | ICD-10-CM | POA: Diagnosis not present

## 2019-10-19 DIAGNOSIS — I129 Hypertensive chronic kidney disease with stage 1 through stage 4 chronic kidney disease, or unspecified chronic kidney disease: Secondary | ICD-10-CM | POA: Diagnosis not present

## 2019-10-19 DIAGNOSIS — S8992XA Unspecified injury of left lower leg, initial encounter: Secondary | ICD-10-CM | POA: Diagnosis not present

## 2019-10-19 DIAGNOSIS — N184 Chronic kidney disease, stage 4 (severe): Secondary | ICD-10-CM | POA: Diagnosis not present

## 2019-10-19 DIAGNOSIS — Z4781 Encounter for orthopedic aftercare following surgical amputation: Secondary | ICD-10-CM | POA: Diagnosis not present

## 2019-10-19 DIAGNOSIS — K760 Fatty (change of) liver, not elsewhere classified: Secondary | ICD-10-CM | POA: Diagnosis not present

## 2019-10-19 DIAGNOSIS — Z86010 Personal history of colonic polyps: Secondary | ICD-10-CM | POA: Diagnosis not present

## 2019-10-19 DIAGNOSIS — K589 Irritable bowel syndrome without diarrhea: Secondary | ICD-10-CM | POA: Diagnosis not present

## 2019-10-19 DIAGNOSIS — S8002XA Contusion of left knee, initial encounter: Secondary | ICD-10-CM | POA: Diagnosis not present

## 2019-10-19 DIAGNOSIS — Z89512 Acquired absence of left leg below knee: Secondary | ICD-10-CM | POA: Diagnosis not present

## 2019-10-19 DIAGNOSIS — E785 Hyperlipidemia, unspecified: Secondary | ICD-10-CM | POA: Diagnosis not present

## 2019-10-19 DIAGNOSIS — M25562 Pain in left knee: Secondary | ICD-10-CM | POA: Diagnosis not present

## 2019-10-20 DIAGNOSIS — D631 Anemia in chronic kidney disease: Secondary | ICD-10-CM | POA: Diagnosis not present

## 2019-10-20 DIAGNOSIS — D509 Iron deficiency anemia, unspecified: Secondary | ICD-10-CM | POA: Diagnosis not present

## 2019-10-20 DIAGNOSIS — N186 End stage renal disease: Secondary | ICD-10-CM | POA: Diagnosis not present

## 2019-10-23 DIAGNOSIS — N186 End stage renal disease: Secondary | ICD-10-CM | POA: Diagnosis not present

## 2019-10-23 DIAGNOSIS — D631 Anemia in chronic kidney disease: Secondary | ICD-10-CM | POA: Diagnosis not present

## 2019-10-23 DIAGNOSIS — D509 Iron deficiency anemia, unspecified: Secondary | ICD-10-CM | POA: Diagnosis not present

## 2019-10-24 DIAGNOSIS — Z4781 Encounter for orthopedic aftercare following surgical amputation: Secondary | ICD-10-CM | POA: Diagnosis not present

## 2019-10-25 DIAGNOSIS — D509 Iron deficiency anemia, unspecified: Secondary | ICD-10-CM | POA: Diagnosis not present

## 2019-10-25 DIAGNOSIS — N186 End stage renal disease: Secondary | ICD-10-CM | POA: Diagnosis not present

## 2019-10-25 DIAGNOSIS — D631 Anemia in chronic kidney disease: Secondary | ICD-10-CM | POA: Diagnosis not present

## 2019-10-27 DIAGNOSIS — D631 Anemia in chronic kidney disease: Secondary | ICD-10-CM | POA: Diagnosis not present

## 2019-10-27 DIAGNOSIS — D509 Iron deficiency anemia, unspecified: Secondary | ICD-10-CM | POA: Diagnosis not present

## 2019-10-27 DIAGNOSIS — N186 End stage renal disease: Secondary | ICD-10-CM | POA: Diagnosis not present

## 2019-10-30 DIAGNOSIS — D509 Iron deficiency anemia, unspecified: Secondary | ICD-10-CM | POA: Diagnosis not present

## 2019-10-30 DIAGNOSIS — D631 Anemia in chronic kidney disease: Secondary | ICD-10-CM | POA: Diagnosis not present

## 2019-10-30 DIAGNOSIS — N186 End stage renal disease: Secondary | ICD-10-CM | POA: Diagnosis not present

## 2019-10-31 DIAGNOSIS — Z4781 Encounter for orthopedic aftercare following surgical amputation: Secondary | ICD-10-CM | POA: Diagnosis not present

## 2019-11-01 DIAGNOSIS — N186 End stage renal disease: Secondary | ICD-10-CM | POA: Diagnosis not present

## 2019-11-01 DIAGNOSIS — D509 Iron deficiency anemia, unspecified: Secondary | ICD-10-CM | POA: Diagnosis not present

## 2019-11-01 DIAGNOSIS — D631 Anemia in chronic kidney disease: Secondary | ICD-10-CM | POA: Diagnosis not present

## 2019-11-03 DIAGNOSIS — N186 End stage renal disease: Secondary | ICD-10-CM | POA: Diagnosis not present

## 2019-11-03 DIAGNOSIS — D631 Anemia in chronic kidney disease: Secondary | ICD-10-CM | POA: Diagnosis not present

## 2019-11-03 DIAGNOSIS — E113313 Type 2 diabetes mellitus with moderate nonproliferative diabetic retinopathy with macular edema, bilateral: Secondary | ICD-10-CM | POA: Diagnosis not present

## 2019-11-03 DIAGNOSIS — D509 Iron deficiency anemia, unspecified: Secondary | ICD-10-CM | POA: Diagnosis not present

## 2019-11-06 DIAGNOSIS — D509 Iron deficiency anemia, unspecified: Secondary | ICD-10-CM | POA: Diagnosis not present

## 2019-11-06 DIAGNOSIS — D631 Anemia in chronic kidney disease: Secondary | ICD-10-CM | POA: Diagnosis not present

## 2019-11-06 DIAGNOSIS — N186 End stage renal disease: Secondary | ICD-10-CM | POA: Diagnosis not present

## 2019-11-07 DIAGNOSIS — Z992 Dependence on renal dialysis: Secondary | ICD-10-CM | POA: Diagnosis not present

## 2019-11-07 DIAGNOSIS — N186 End stage renal disease: Secondary | ICD-10-CM | POA: Diagnosis not present

## 2019-11-07 DIAGNOSIS — Z4781 Encounter for orthopedic aftercare following surgical amputation: Secondary | ICD-10-CM | POA: Diagnosis not present

## 2019-11-08 DIAGNOSIS — D631 Anemia in chronic kidney disease: Secondary | ICD-10-CM | POA: Diagnosis not present

## 2019-11-08 DIAGNOSIS — Z89512 Acquired absence of left leg below knee: Secondary | ICD-10-CM | POA: Diagnosis not present

## 2019-11-08 DIAGNOSIS — Z4781 Encounter for orthopedic aftercare following surgical amputation: Secondary | ICD-10-CM | POA: Diagnosis not present

## 2019-11-08 DIAGNOSIS — E119 Type 2 diabetes mellitus without complications: Secondary | ICD-10-CM | POA: Diagnosis not present

## 2019-11-08 DIAGNOSIS — N186 End stage renal disease: Secondary | ICD-10-CM | POA: Diagnosis not present

## 2019-11-08 DIAGNOSIS — D509 Iron deficiency anemia, unspecified: Secondary | ICD-10-CM | POA: Diagnosis not present

## 2019-11-09 DIAGNOSIS — R2689 Other abnormalities of gait and mobility: Secondary | ICD-10-CM | POA: Diagnosis not present

## 2019-11-09 DIAGNOSIS — Z4781 Encounter for orthopedic aftercare following surgical amputation: Secondary | ICD-10-CM | POA: Diagnosis not present

## 2019-11-09 DIAGNOSIS — Z89512 Acquired absence of left leg below knee: Secondary | ICD-10-CM | POA: Diagnosis not present

## 2019-11-09 DIAGNOSIS — E1169 Type 2 diabetes mellitus with other specified complication: Secondary | ICD-10-CM | POA: Diagnosis not present

## 2019-11-10 DIAGNOSIS — D509 Iron deficiency anemia, unspecified: Secondary | ICD-10-CM | POA: Diagnosis not present

## 2019-11-10 DIAGNOSIS — N186 End stage renal disease: Secondary | ICD-10-CM | POA: Diagnosis not present

## 2019-11-10 DIAGNOSIS — D631 Anemia in chronic kidney disease: Secondary | ICD-10-CM | POA: Diagnosis not present

## 2019-11-13 DIAGNOSIS — D631 Anemia in chronic kidney disease: Secondary | ICD-10-CM | POA: Diagnosis not present

## 2019-11-13 DIAGNOSIS — N186 End stage renal disease: Secondary | ICD-10-CM | POA: Diagnosis not present

## 2019-11-13 DIAGNOSIS — D509 Iron deficiency anemia, unspecified: Secondary | ICD-10-CM | POA: Diagnosis not present

## 2019-11-14 DIAGNOSIS — E1169 Type 2 diabetes mellitus with other specified complication: Secondary | ICD-10-CM | POA: Diagnosis not present

## 2019-11-14 DIAGNOSIS — Z89512 Acquired absence of left leg below knee: Secondary | ICD-10-CM | POA: Diagnosis not present

## 2019-11-14 DIAGNOSIS — R2689 Other abnormalities of gait and mobility: Secondary | ICD-10-CM | POA: Diagnosis not present

## 2019-11-14 DIAGNOSIS — Z4781 Encounter for orthopedic aftercare following surgical amputation: Secondary | ICD-10-CM | POA: Diagnosis not present

## 2019-11-15 DIAGNOSIS — N186 End stage renal disease: Secondary | ICD-10-CM | POA: Diagnosis not present

## 2019-11-15 DIAGNOSIS — D631 Anemia in chronic kidney disease: Secondary | ICD-10-CM | POA: Diagnosis not present

## 2019-11-15 DIAGNOSIS — D509 Iron deficiency anemia, unspecified: Secondary | ICD-10-CM | POA: Diagnosis not present

## 2019-11-16 DIAGNOSIS — R2689 Other abnormalities of gait and mobility: Secondary | ICD-10-CM | POA: Diagnosis not present

## 2019-11-16 DIAGNOSIS — Z4781 Encounter for orthopedic aftercare following surgical amputation: Secondary | ICD-10-CM | POA: Diagnosis not present

## 2019-11-16 DIAGNOSIS — Z89512 Acquired absence of left leg below knee: Secondary | ICD-10-CM | POA: Diagnosis not present

## 2019-11-16 DIAGNOSIS — E1169 Type 2 diabetes mellitus with other specified complication: Secondary | ICD-10-CM | POA: Diagnosis not present

## 2019-11-17 DIAGNOSIS — N186 End stage renal disease: Secondary | ICD-10-CM | POA: Diagnosis not present

## 2019-11-17 DIAGNOSIS — D631 Anemia in chronic kidney disease: Secondary | ICD-10-CM | POA: Diagnosis not present

## 2019-11-17 DIAGNOSIS — D509 Iron deficiency anemia, unspecified: Secondary | ICD-10-CM | POA: Diagnosis not present

## 2019-11-20 DIAGNOSIS — N186 End stage renal disease: Secondary | ICD-10-CM | POA: Diagnosis not present

## 2019-11-22 DIAGNOSIS — N186 End stage renal disease: Secondary | ICD-10-CM | POA: Diagnosis not present

## 2019-11-23 DIAGNOSIS — Z89512 Acquired absence of left leg below knee: Secondary | ICD-10-CM | POA: Diagnosis not present

## 2019-11-23 DIAGNOSIS — R2689 Other abnormalities of gait and mobility: Secondary | ICD-10-CM | POA: Diagnosis not present

## 2019-11-23 DIAGNOSIS — Z4781 Encounter for orthopedic aftercare following surgical amputation: Secondary | ICD-10-CM | POA: Diagnosis not present

## 2019-11-23 DIAGNOSIS — E1169 Type 2 diabetes mellitus with other specified complication: Secondary | ICD-10-CM | POA: Diagnosis not present

## 2019-11-24 DIAGNOSIS — N186 End stage renal disease: Secondary | ICD-10-CM | POA: Diagnosis not present

## 2019-11-27 DIAGNOSIS — N186 End stage renal disease: Secondary | ICD-10-CM | POA: Diagnosis not present

## 2019-11-28 DIAGNOSIS — Z4781 Encounter for orthopedic aftercare following surgical amputation: Secondary | ICD-10-CM | POA: Diagnosis not present

## 2019-11-28 DIAGNOSIS — E1169 Type 2 diabetes mellitus with other specified complication: Secondary | ICD-10-CM | POA: Diagnosis not present

## 2019-11-28 DIAGNOSIS — R2689 Other abnormalities of gait and mobility: Secondary | ICD-10-CM | POA: Diagnosis not present

## 2019-11-28 DIAGNOSIS — Z89512 Acquired absence of left leg below knee: Secondary | ICD-10-CM | POA: Diagnosis not present

## 2019-11-29 DIAGNOSIS — D631 Anemia in chronic kidney disease: Secondary | ICD-10-CM | POA: Diagnosis not present

## 2019-11-29 DIAGNOSIS — N186 End stage renal disease: Secondary | ICD-10-CM | POA: Diagnosis not present

## 2019-11-30 DIAGNOSIS — Z4781 Encounter for orthopedic aftercare following surgical amputation: Secondary | ICD-10-CM | POA: Diagnosis not present

## 2019-11-30 DIAGNOSIS — Z89512 Acquired absence of left leg below knee: Secondary | ICD-10-CM | POA: Diagnosis not present

## 2019-11-30 DIAGNOSIS — R2689 Other abnormalities of gait and mobility: Secondary | ICD-10-CM | POA: Diagnosis not present

## 2019-11-30 DIAGNOSIS — E1169 Type 2 diabetes mellitus with other specified complication: Secondary | ICD-10-CM | POA: Diagnosis not present

## 2019-12-01 DIAGNOSIS — N186 End stage renal disease: Secondary | ICD-10-CM | POA: Diagnosis not present

## 2019-12-01 DIAGNOSIS — D631 Anemia in chronic kidney disease: Secondary | ICD-10-CM | POA: Diagnosis not present

## 2019-12-04 DIAGNOSIS — N186 End stage renal disease: Secondary | ICD-10-CM | POA: Diagnosis not present

## 2019-12-04 DIAGNOSIS — D631 Anemia in chronic kidney disease: Secondary | ICD-10-CM | POA: Diagnosis not present

## 2019-12-05 ENCOUNTER — Ambulatory Visit: Payer: BC Managed Care – PPO | Admitting: Internal Medicine

## 2019-12-06 DIAGNOSIS — D631 Anemia in chronic kidney disease: Secondary | ICD-10-CM | POA: Diagnosis not present

## 2019-12-06 DIAGNOSIS — N186 End stage renal disease: Secondary | ICD-10-CM | POA: Diagnosis not present

## 2019-12-07 DIAGNOSIS — Z89512 Acquired absence of left leg below knee: Secondary | ICD-10-CM | POA: Diagnosis not present

## 2019-12-07 DIAGNOSIS — Z992 Dependence on renal dialysis: Secondary | ICD-10-CM | POA: Diagnosis not present

## 2019-12-07 DIAGNOSIS — N186 End stage renal disease: Secondary | ICD-10-CM | POA: Diagnosis not present

## 2019-12-07 DIAGNOSIS — E1169 Type 2 diabetes mellitus with other specified complication: Secondary | ICD-10-CM | POA: Diagnosis not present

## 2019-12-07 DIAGNOSIS — Z4781 Encounter for orthopedic aftercare following surgical amputation: Secondary | ICD-10-CM | POA: Diagnosis not present

## 2019-12-07 DIAGNOSIS — R2689 Other abnormalities of gait and mobility: Secondary | ICD-10-CM | POA: Diagnosis not present

## 2019-12-08 DIAGNOSIS — D631 Anemia in chronic kidney disease: Secondary | ICD-10-CM | POA: Diagnosis not present

## 2019-12-08 DIAGNOSIS — N186 End stage renal disease: Secondary | ICD-10-CM | POA: Diagnosis not present

## 2019-12-08 DIAGNOSIS — D509 Iron deficiency anemia, unspecified: Secondary | ICD-10-CM | POA: Diagnosis not present

## 2019-12-08 DIAGNOSIS — N2581 Secondary hyperparathyroidism of renal origin: Secondary | ICD-10-CM | POA: Diagnosis not present

## 2019-12-11 DIAGNOSIS — D509 Iron deficiency anemia, unspecified: Secondary | ICD-10-CM | POA: Diagnosis not present

## 2019-12-11 DIAGNOSIS — D631 Anemia in chronic kidney disease: Secondary | ICD-10-CM | POA: Diagnosis not present

## 2019-12-11 DIAGNOSIS — N186 End stage renal disease: Secondary | ICD-10-CM | POA: Diagnosis not present

## 2019-12-11 DIAGNOSIS — N2581 Secondary hyperparathyroidism of renal origin: Secondary | ICD-10-CM | POA: Diagnosis not present

## 2019-12-12 DIAGNOSIS — Z89512 Acquired absence of left leg below knee: Secondary | ICD-10-CM | POA: Diagnosis not present

## 2019-12-12 DIAGNOSIS — E1165 Type 2 diabetes mellitus with hyperglycemia: Secondary | ICD-10-CM | POA: Diagnosis not present

## 2019-12-12 DIAGNOSIS — Z4781 Encounter for orthopedic aftercare following surgical amputation: Secondary | ICD-10-CM | POA: Diagnosis not present

## 2019-12-12 DIAGNOSIS — Z794 Long term (current) use of insulin: Secondary | ICD-10-CM | POA: Diagnosis not present

## 2019-12-13 DIAGNOSIS — D509 Iron deficiency anemia, unspecified: Secondary | ICD-10-CM | POA: Diagnosis not present

## 2019-12-13 DIAGNOSIS — E119 Type 2 diabetes mellitus without complications: Secondary | ICD-10-CM | POA: Diagnosis not present

## 2019-12-13 DIAGNOSIS — N2581 Secondary hyperparathyroidism of renal origin: Secondary | ICD-10-CM | POA: Diagnosis not present

## 2019-12-13 DIAGNOSIS — D631 Anemia in chronic kidney disease: Secondary | ICD-10-CM | POA: Diagnosis not present

## 2019-12-13 DIAGNOSIS — N186 End stage renal disease: Secondary | ICD-10-CM | POA: Diagnosis not present

## 2019-12-14 DIAGNOSIS — Z4781 Encounter for orthopedic aftercare following surgical amputation: Secondary | ICD-10-CM | POA: Diagnosis not present

## 2019-12-14 DIAGNOSIS — Z89512 Acquired absence of left leg below knee: Secondary | ICD-10-CM | POA: Diagnosis not present

## 2019-12-14 DIAGNOSIS — E1165 Type 2 diabetes mellitus with hyperglycemia: Secondary | ICD-10-CM | POA: Diagnosis not present

## 2019-12-14 DIAGNOSIS — Z794 Long term (current) use of insulin: Secondary | ICD-10-CM | POA: Diagnosis not present

## 2019-12-15 DIAGNOSIS — D509 Iron deficiency anemia, unspecified: Secondary | ICD-10-CM | POA: Diagnosis not present

## 2019-12-15 DIAGNOSIS — N2581 Secondary hyperparathyroidism of renal origin: Secondary | ICD-10-CM | POA: Diagnosis not present

## 2019-12-15 DIAGNOSIS — D631 Anemia in chronic kidney disease: Secondary | ICD-10-CM | POA: Diagnosis not present

## 2019-12-15 DIAGNOSIS — Z89512 Acquired absence of left leg below knee: Secondary | ICD-10-CM | POA: Diagnosis not present

## 2019-12-15 DIAGNOSIS — N186 End stage renal disease: Secondary | ICD-10-CM | POA: Diagnosis not present

## 2019-12-18 DIAGNOSIS — Z23 Encounter for immunization: Secondary | ICD-10-CM | POA: Diagnosis not present

## 2019-12-18 DIAGNOSIS — N186 End stage renal disease: Secondary | ICD-10-CM | POA: Diagnosis not present

## 2019-12-18 DIAGNOSIS — D631 Anemia in chronic kidney disease: Secondary | ICD-10-CM | POA: Diagnosis not present

## 2019-12-18 DIAGNOSIS — D509 Iron deficiency anemia, unspecified: Secondary | ICD-10-CM | POA: Diagnosis not present

## 2019-12-19 DIAGNOSIS — Z4781 Encounter for orthopedic aftercare following surgical amputation: Secondary | ICD-10-CM | POA: Diagnosis not present

## 2019-12-19 DIAGNOSIS — Z89512 Acquired absence of left leg below knee: Secondary | ICD-10-CM | POA: Diagnosis not present

## 2019-12-19 DIAGNOSIS — E1165 Type 2 diabetes mellitus with hyperglycemia: Secondary | ICD-10-CM | POA: Diagnosis not present

## 2019-12-19 DIAGNOSIS — Z794 Long term (current) use of insulin: Secondary | ICD-10-CM | POA: Diagnosis not present

## 2019-12-20 DIAGNOSIS — N186 End stage renal disease: Secondary | ICD-10-CM | POA: Diagnosis not present

## 2019-12-20 DIAGNOSIS — Z23 Encounter for immunization: Secondary | ICD-10-CM | POA: Diagnosis not present

## 2019-12-20 DIAGNOSIS — D509 Iron deficiency anemia, unspecified: Secondary | ICD-10-CM | POA: Diagnosis not present

## 2019-12-20 DIAGNOSIS — D631 Anemia in chronic kidney disease: Secondary | ICD-10-CM | POA: Diagnosis not present

## 2019-12-21 DIAGNOSIS — Z4781 Encounter for orthopedic aftercare following surgical amputation: Secondary | ICD-10-CM | POA: Diagnosis not present

## 2019-12-21 DIAGNOSIS — E1165 Type 2 diabetes mellitus with hyperglycemia: Secondary | ICD-10-CM | POA: Diagnosis not present

## 2019-12-21 DIAGNOSIS — Z794 Long term (current) use of insulin: Secondary | ICD-10-CM | POA: Diagnosis not present

## 2019-12-21 DIAGNOSIS — Z89512 Acquired absence of left leg below knee: Secondary | ICD-10-CM | POA: Diagnosis not present

## 2019-12-22 DIAGNOSIS — Z23 Encounter for immunization: Secondary | ICD-10-CM | POA: Diagnosis not present

## 2019-12-22 DIAGNOSIS — D509 Iron deficiency anemia, unspecified: Secondary | ICD-10-CM | POA: Diagnosis not present

## 2019-12-22 DIAGNOSIS — D631 Anemia in chronic kidney disease: Secondary | ICD-10-CM | POA: Diagnosis not present

## 2019-12-22 DIAGNOSIS — N186 End stage renal disease: Secondary | ICD-10-CM | POA: Diagnosis not present

## 2019-12-25 DIAGNOSIS — N186 End stage renal disease: Secondary | ICD-10-CM | POA: Diagnosis not present

## 2019-12-25 DIAGNOSIS — Z23 Encounter for immunization: Secondary | ICD-10-CM | POA: Diagnosis not present

## 2019-12-25 DIAGNOSIS — D631 Anemia in chronic kidney disease: Secondary | ICD-10-CM | POA: Diagnosis not present

## 2019-12-25 DIAGNOSIS — D509 Iron deficiency anemia, unspecified: Secondary | ICD-10-CM | POA: Diagnosis not present

## 2019-12-26 DIAGNOSIS — Z89512 Acquired absence of left leg below knee: Secondary | ICD-10-CM | POA: Diagnosis not present

## 2019-12-26 DIAGNOSIS — Z4781 Encounter for orthopedic aftercare following surgical amputation: Secondary | ICD-10-CM | POA: Diagnosis not present

## 2019-12-26 DIAGNOSIS — Z794 Long term (current) use of insulin: Secondary | ICD-10-CM | POA: Diagnosis not present

## 2019-12-26 DIAGNOSIS — E1165 Type 2 diabetes mellitus with hyperglycemia: Secondary | ICD-10-CM | POA: Diagnosis not present

## 2019-12-27 DIAGNOSIS — Z23 Encounter for immunization: Secondary | ICD-10-CM | POA: Diagnosis not present

## 2019-12-27 DIAGNOSIS — D631 Anemia in chronic kidney disease: Secondary | ICD-10-CM | POA: Diagnosis not present

## 2019-12-27 DIAGNOSIS — N186 End stage renal disease: Secondary | ICD-10-CM | POA: Diagnosis not present

## 2019-12-27 DIAGNOSIS — D509 Iron deficiency anemia, unspecified: Secondary | ICD-10-CM | POA: Diagnosis not present

## 2019-12-28 DIAGNOSIS — E1165 Type 2 diabetes mellitus with hyperglycemia: Secondary | ICD-10-CM | POA: Diagnosis not present

## 2019-12-28 DIAGNOSIS — Z89512 Acquired absence of left leg below knee: Secondary | ICD-10-CM | POA: Diagnosis not present

## 2019-12-28 DIAGNOSIS — Z4781 Encounter for orthopedic aftercare following surgical amputation: Secondary | ICD-10-CM | POA: Diagnosis not present

## 2019-12-28 DIAGNOSIS — Z794 Long term (current) use of insulin: Secondary | ICD-10-CM | POA: Diagnosis not present

## 2019-12-29 DIAGNOSIS — D509 Iron deficiency anemia, unspecified: Secondary | ICD-10-CM | POA: Diagnosis not present

## 2019-12-29 DIAGNOSIS — N186 End stage renal disease: Secondary | ICD-10-CM | POA: Diagnosis not present

## 2020-01-01 DIAGNOSIS — N186 End stage renal disease: Secondary | ICD-10-CM | POA: Diagnosis not present

## 2020-01-01 DIAGNOSIS — D509 Iron deficiency anemia, unspecified: Secondary | ICD-10-CM | POA: Diagnosis not present

## 2020-01-02 DIAGNOSIS — E1165 Type 2 diabetes mellitus with hyperglycemia: Secondary | ICD-10-CM | POA: Diagnosis not present

## 2020-01-02 DIAGNOSIS — Z794 Long term (current) use of insulin: Secondary | ICD-10-CM | POA: Diagnosis not present

## 2020-01-02 DIAGNOSIS — Z89512 Acquired absence of left leg below knee: Secondary | ICD-10-CM | POA: Diagnosis not present

## 2020-01-02 DIAGNOSIS — Z4781 Encounter for orthopedic aftercare following surgical amputation: Secondary | ICD-10-CM | POA: Diagnosis not present

## 2020-01-03 DIAGNOSIS — D509 Iron deficiency anemia, unspecified: Secondary | ICD-10-CM | POA: Diagnosis not present

## 2020-01-03 DIAGNOSIS — N186 End stage renal disease: Secondary | ICD-10-CM | POA: Diagnosis not present

## 2020-01-04 DIAGNOSIS — E785 Hyperlipidemia, unspecified: Secondary | ICD-10-CM | POA: Diagnosis not present

## 2020-01-04 DIAGNOSIS — Z7984 Long term (current) use of oral hypoglycemic drugs: Secondary | ICD-10-CM | POA: Diagnosis not present

## 2020-01-04 DIAGNOSIS — E11610 Type 2 diabetes mellitus with diabetic neuropathic arthropathy: Secondary | ICD-10-CM | POA: Diagnosis not present

## 2020-01-04 DIAGNOSIS — Z992 Dependence on renal dialysis: Secondary | ICD-10-CM | POA: Diagnosis not present

## 2020-01-04 DIAGNOSIS — Z9841 Cataract extraction status, right eye: Secondary | ICD-10-CM | POA: Diagnosis not present

## 2020-01-04 DIAGNOSIS — Z9889 Other specified postprocedural states: Secondary | ICD-10-CM | POA: Diagnosis not present

## 2020-01-04 DIAGNOSIS — T82858A Stenosis of vascular prosthetic devices, implants and grafts, initial encounter: Secondary | ICD-10-CM | POA: Diagnosis not present

## 2020-01-04 DIAGNOSIS — Z87891 Personal history of nicotine dependence: Secondary | ICD-10-CM | POA: Diagnosis not present

## 2020-01-04 DIAGNOSIS — Z833 Family history of diabetes mellitus: Secondary | ICD-10-CM | POA: Diagnosis not present

## 2020-01-04 DIAGNOSIS — Z981 Arthrodesis status: Secondary | ICD-10-CM | POA: Diagnosis not present

## 2020-01-04 DIAGNOSIS — Z79899 Other long term (current) drug therapy: Secondary | ICD-10-CM | POA: Diagnosis not present

## 2020-01-04 DIAGNOSIS — N186 End stage renal disease: Secondary | ICD-10-CM | POA: Diagnosis not present

## 2020-01-04 DIAGNOSIS — T829XXA Unspecified complication of cardiac and vascular prosthetic device, implant and graft, initial encounter: Secondary | ICD-10-CM | POA: Diagnosis not present

## 2020-01-04 DIAGNOSIS — Z794 Long term (current) use of insulin: Secondary | ICD-10-CM | POA: Diagnosis not present

## 2020-01-04 DIAGNOSIS — E1122 Type 2 diabetes mellitus with diabetic chronic kidney disease: Secondary | ICD-10-CM | POA: Diagnosis not present

## 2020-01-04 DIAGNOSIS — M1990 Unspecified osteoarthritis, unspecified site: Secondary | ICD-10-CM | POA: Diagnosis not present

## 2020-01-04 DIAGNOSIS — I120 Hypertensive chronic kidney disease with stage 5 chronic kidney disease or end stage renal disease: Secondary | ICD-10-CM | POA: Diagnosis not present

## 2020-01-05 DIAGNOSIS — N186 End stage renal disease: Secondary | ICD-10-CM | POA: Diagnosis not present

## 2020-01-05 DIAGNOSIS — D509 Iron deficiency anemia, unspecified: Secondary | ICD-10-CM | POA: Diagnosis not present

## 2020-01-07 DIAGNOSIS — Z992 Dependence on renal dialysis: Secondary | ICD-10-CM | POA: Diagnosis not present

## 2020-01-07 DIAGNOSIS — N186 End stage renal disease: Secondary | ICD-10-CM | POA: Diagnosis not present

## 2020-01-08 DIAGNOSIS — N186 End stage renal disease: Secondary | ICD-10-CM | POA: Diagnosis not present

## 2020-01-08 DIAGNOSIS — D631 Anemia in chronic kidney disease: Secondary | ICD-10-CM | POA: Diagnosis not present

## 2020-01-08 DIAGNOSIS — D509 Iron deficiency anemia, unspecified: Secondary | ICD-10-CM | POA: Diagnosis not present

## 2020-01-09 DIAGNOSIS — Z4789 Encounter for other orthopedic aftercare: Secondary | ICD-10-CM | POA: Diagnosis not present

## 2020-01-09 DIAGNOSIS — Z89512 Acquired absence of left leg below knee: Secondary | ICD-10-CM | POA: Diagnosis not present

## 2020-01-09 DIAGNOSIS — M5459 Other low back pain: Secondary | ICD-10-CM | POA: Diagnosis not present

## 2020-01-09 DIAGNOSIS — E1169 Type 2 diabetes mellitus with other specified complication: Secondary | ICD-10-CM | POA: Diagnosis not present

## 2020-01-10 DIAGNOSIS — E119 Type 2 diabetes mellitus without complications: Secondary | ICD-10-CM | POA: Diagnosis not present

## 2020-01-10 DIAGNOSIS — D509 Iron deficiency anemia, unspecified: Secondary | ICD-10-CM | POA: Diagnosis not present

## 2020-01-10 DIAGNOSIS — N186 End stage renal disease: Secondary | ICD-10-CM | POA: Diagnosis not present

## 2020-01-10 DIAGNOSIS — D631 Anemia in chronic kidney disease: Secondary | ICD-10-CM | POA: Diagnosis not present

## 2020-01-12 DIAGNOSIS — N186 End stage renal disease: Secondary | ICD-10-CM | POA: Diagnosis not present

## 2020-01-12 DIAGNOSIS — D509 Iron deficiency anemia, unspecified: Secondary | ICD-10-CM | POA: Diagnosis not present

## 2020-01-12 DIAGNOSIS — D631 Anemia in chronic kidney disease: Secondary | ICD-10-CM | POA: Diagnosis not present

## 2020-01-15 DIAGNOSIS — D509 Iron deficiency anemia, unspecified: Secondary | ICD-10-CM | POA: Diagnosis not present

## 2020-01-15 DIAGNOSIS — N186 End stage renal disease: Secondary | ICD-10-CM | POA: Diagnosis not present

## 2020-01-15 DIAGNOSIS — D631 Anemia in chronic kidney disease: Secondary | ICD-10-CM | POA: Diagnosis not present

## 2020-01-16 DIAGNOSIS — M5136 Other intervertebral disc degeneration, lumbar region: Secondary | ICD-10-CM | POA: Diagnosis not present

## 2020-01-16 DIAGNOSIS — G8929 Other chronic pain: Secondary | ICD-10-CM | POA: Diagnosis not present

## 2020-01-16 DIAGNOSIS — M519 Unspecified thoracic, thoracolumbar and lumbosacral intervertebral disc disorder: Secondary | ICD-10-CM | POA: Diagnosis not present

## 2020-01-16 DIAGNOSIS — M545 Low back pain, unspecified: Secondary | ICD-10-CM | POA: Diagnosis not present

## 2020-01-16 DIAGNOSIS — Z89512 Acquired absence of left leg below knee: Secondary | ICD-10-CM | POA: Diagnosis not present

## 2020-01-16 DIAGNOSIS — M5459 Other low back pain: Secondary | ICD-10-CM | POA: Diagnosis not present

## 2020-01-16 DIAGNOSIS — M47816 Spondylosis without myelopathy or radiculopathy, lumbar region: Secondary | ICD-10-CM | POA: Diagnosis not present

## 2020-01-16 DIAGNOSIS — M4606 Spinal enthesopathy, lumbar region: Secondary | ICD-10-CM | POA: Diagnosis not present

## 2020-01-16 DIAGNOSIS — M47817 Spondylosis without myelopathy or radiculopathy, lumbosacral region: Secondary | ICD-10-CM | POA: Diagnosis not present

## 2020-01-16 DIAGNOSIS — E1169 Type 2 diabetes mellitus with other specified complication: Secondary | ICD-10-CM | POA: Diagnosis not present

## 2020-01-16 DIAGNOSIS — Z4789 Encounter for other orthopedic aftercare: Secondary | ICD-10-CM | POA: Diagnosis not present

## 2020-01-16 DIAGNOSIS — N186 End stage renal disease: Secondary | ICD-10-CM | POA: Diagnosis not present

## 2020-01-17 DIAGNOSIS — D631 Anemia in chronic kidney disease: Secondary | ICD-10-CM | POA: Diagnosis not present

## 2020-01-17 DIAGNOSIS — N186 End stage renal disease: Secondary | ICD-10-CM | POA: Diagnosis not present

## 2020-01-17 DIAGNOSIS — D509 Iron deficiency anemia, unspecified: Secondary | ICD-10-CM | POA: Diagnosis not present

## 2020-01-18 DIAGNOSIS — E782 Mixed hyperlipidemia: Secondary | ICD-10-CM | POA: Diagnosis not present

## 2020-01-18 DIAGNOSIS — I10 Essential (primary) hypertension: Secondary | ICD-10-CM | POA: Diagnosis not present

## 2020-01-18 DIAGNOSIS — Z794 Long term (current) use of insulin: Secondary | ICD-10-CM | POA: Diagnosis not present

## 2020-01-18 DIAGNOSIS — E1142 Type 2 diabetes mellitus with diabetic polyneuropathy: Secondary | ICD-10-CM | POA: Diagnosis not present

## 2020-01-18 DIAGNOSIS — E1165 Type 2 diabetes mellitus with hyperglycemia: Secondary | ICD-10-CM | POA: Diagnosis not present

## 2020-01-19 DIAGNOSIS — D631 Anemia in chronic kidney disease: Secondary | ICD-10-CM | POA: Diagnosis not present

## 2020-01-19 DIAGNOSIS — N186 End stage renal disease: Secondary | ICD-10-CM | POA: Diagnosis not present

## 2020-01-19 DIAGNOSIS — D509 Iron deficiency anemia, unspecified: Secondary | ICD-10-CM | POA: Diagnosis not present

## 2020-01-22 DIAGNOSIS — N186 End stage renal disease: Secondary | ICD-10-CM | POA: Diagnosis not present

## 2020-01-22 DIAGNOSIS — D631 Anemia in chronic kidney disease: Secondary | ICD-10-CM | POA: Diagnosis not present

## 2020-01-22 DIAGNOSIS — D509 Iron deficiency anemia, unspecified: Secondary | ICD-10-CM | POA: Diagnosis not present

## 2020-01-23 DIAGNOSIS — Z89512 Acquired absence of left leg below knee: Secondary | ICD-10-CM | POA: Diagnosis not present

## 2020-01-23 DIAGNOSIS — M5459 Other low back pain: Secondary | ICD-10-CM | POA: Diagnosis not present

## 2020-01-23 DIAGNOSIS — E1169 Type 2 diabetes mellitus with other specified complication: Secondary | ICD-10-CM | POA: Diagnosis not present

## 2020-01-23 DIAGNOSIS — Z4789 Encounter for other orthopedic aftercare: Secondary | ICD-10-CM | POA: Diagnosis not present

## 2020-01-24 DIAGNOSIS — N186 End stage renal disease: Secondary | ICD-10-CM | POA: Diagnosis not present

## 2020-01-24 DIAGNOSIS — D631 Anemia in chronic kidney disease: Secondary | ICD-10-CM | POA: Diagnosis not present

## 2020-01-24 DIAGNOSIS — D509 Iron deficiency anemia, unspecified: Secondary | ICD-10-CM | POA: Diagnosis not present

## 2020-01-26 DIAGNOSIS — N186 End stage renal disease: Secondary | ICD-10-CM | POA: Diagnosis not present

## 2020-01-26 DIAGNOSIS — D509 Iron deficiency anemia, unspecified: Secondary | ICD-10-CM | POA: Diagnosis not present

## 2020-01-26 DIAGNOSIS — D631 Anemia in chronic kidney disease: Secondary | ICD-10-CM | POA: Diagnosis not present

## 2020-01-28 DIAGNOSIS — D509 Iron deficiency anemia, unspecified: Secondary | ICD-10-CM | POA: Diagnosis not present

## 2020-01-28 DIAGNOSIS — D631 Anemia in chronic kidney disease: Secondary | ICD-10-CM | POA: Diagnosis not present

## 2020-01-28 DIAGNOSIS — N186 End stage renal disease: Secondary | ICD-10-CM | POA: Diagnosis not present

## 2020-01-30 ENCOUNTER — Other Ambulatory Visit: Payer: Self-pay

## 2020-01-30 ENCOUNTER — Encounter: Payer: Self-pay | Admitting: Internal Medicine

## 2020-01-30 ENCOUNTER — Ambulatory Visit (INDEPENDENT_AMBULATORY_CARE_PROVIDER_SITE_OTHER): Payer: BC Managed Care – PPO | Admitting: Internal Medicine

## 2020-01-30 VITALS — BP 132/82 | HR 87 | Ht 71.0 in | Wt 292.9 lb

## 2020-01-30 DIAGNOSIS — E1122 Type 2 diabetes mellitus with diabetic chronic kidney disease: Secondary | ICD-10-CM | POA: Diagnosis not present

## 2020-01-30 DIAGNOSIS — E785 Hyperlipidemia, unspecified: Secondary | ICD-10-CM

## 2020-01-30 DIAGNOSIS — D509 Iron deficiency anemia, unspecified: Secondary | ICD-10-CM | POA: Diagnosis not present

## 2020-01-30 DIAGNOSIS — N186 End stage renal disease: Secondary | ICD-10-CM | POA: Diagnosis not present

## 2020-01-30 DIAGNOSIS — D631 Anemia in chronic kidney disease: Secondary | ICD-10-CM | POA: Diagnosis not present

## 2020-01-30 MED ORDER — BASAGLAR KWIKPEN 100 UNIT/ML ~~LOC~~ SOPN: PEN_INJECTOR | SUBCUTANEOUS | 3 refills | Status: DC

## 2020-01-30 MED ORDER — OZEMPIC (1 MG/DOSE) 4 MG/3ML ~~LOC~~ SOPN
1.0000 mg | PEN_INJECTOR | SUBCUTANEOUS | 3 refills | Status: DC
Start: 2020-01-30 — End: 2021-02-20

## 2020-01-30 NOTE — Progress Notes (Signed)
Patient ID: Reginald Carpenter, male   DOB: 04-15-1965, 54 y.o.   MRN: [id number]   This visit occurred during the SARS-CoV-2 public health emergency.  Safety protocols were in place, including screening questions prior to the visit, additional usage of staff PPE, and extensive cleaning of exam room while observing appropriate contact time as indicated for disinfecting solutions.   HPI: Reginald Carpenter is a 54 y.o.-year-old male, initially referred by his PCP, Jarrett Soho, PA, presenting for f/u for DM2, dx in ~2000, prev. insulin-dependent since 2017, uncontrolled, with complications (CKD, PN - + Charcot foot L ankle, DR). Last visit 6 months ago.  He is having hemodialysis MWF at 6 AM.  Sugars improved after he got a prosthesis for his left leg and could be  more active -before last visit.  He started to go to [facility] in [place] - for wt loss >> needs to lose 30-35 lbs to qualify for a kidney transplant. He will d/w them about gastric bypass sx.  He will also start going to [employer] soon.  Reviewed HbA1c levels: 01/18/2019: HbA1c 8.1% Lab Results  Component Value Date   HGBA1C 8.2 (A) 08/01/2019   HGBA1C 9.1 (A) 05/02/2019   HGBA1C 9.5 (A) 01/27/2019  10/22/2016: HbA1c 9.2%  Pt is on a regimen of: - Basaglar 25 >> 28 >> 36 units at bedtime >> in am >> in the evening but missing doses >> moved to lunchtime - Ozempic 0.5 mg weekly-started 03/2018 >> 1 mg weekly on Sundays - Glipizide 5 mg before B and D We stopped Amaryl in 03/2018. Stopped Metformin 1000 mg 2x a day, with meals due to poor kidney function He was previously on Amaryl 2 mg before dinner - started 10/2016, stopped 01/2014 due to lows  Pt is not checking his sugars now - ran out of strips... From what he can remember - before starting the diet: - am:200-230 >> 156-221, 275 >> 152-229 (forgot insulin) >> 115-120 - 2h after b'fast: n/c  - before lunch:180-210 >> 156-215 >> 152 >> n/c - 2h after lunch: n/c  >> 200-250 - before dinner:  n/c  >> 165-212 >> 167 >> 150-160 - 2h after dinner: 262 >> 188-197, 230 >> 200 - bedtime:  180-200, 310 >> 92, 178-210, 240 >> see above - nighttime: n/c Lowest sugar was 90s >> 93 >> 130 >> 150 >> 92 >> 90; hypoglycemia awareness in the 60s. Highest sugar was 210 >> 210 >> 310 >> 275 >> 230 >> 200s  Glucometer: [employer]  Pt's meals are: - Breakfast: cereal; sausage and eggs - Lunch: ham and cheese sandwich, salads, pot pie - Dinner: salad, spaghetti, grilled chicken and steamed veggies - Snacks: cottage cheese He was drinking regular sodas but stopped in 2018.  -+ ESRD. 04/02/2019: GFR 12 10/24/2018: 38/1.0 12/17/2017: 65/4.75 10/15/2017: 57/3.87, GFR 17 07/01/2017: ?/2.88, GFR 24, Protein/creatinie ratio: 13,978 05/14/2017: ?/2.42, GFR 30 10/22/2016: 44/1.84, GFR 39, glucose 182  No results found for: BUN, CREATININE  On lisinopril 20.  -+ HL; last set of lipids: 01/18/2020: 170/293/33/88 10/24/2018: 202/260/37/134 12/17/2017: 205/182/35/134 10/22/2016: 208/236/45/115 No results found for: CHOL, HDL, LDLCALC, LDLDIRECT, TRIG, CHOLHDL  On Crestor 10.  - last eye exam was in  2020: no DR reportedly, + Getting IO injectionsbilateral cataract surgery.  -+ numbness and tingling in his feet.  He had Charcot foot surgery in 05/2017 had to have bone graft surgery.   Pt has FH of DM in mother, sister (GDM).  He  also has a history of  HTN, but this improved so he stopped antihypertensive medication.  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  He is on disability.  ROS: Constitutional: + weight loss, no fatigue, no subjective hyperthermia, no subjective hypothermia Eyes: no blurry vision, no xerophthalmia ENT: no sore throat, no nodules palpated in neck, no dysphagia, no odynophagia, no hoarseness Cardiovascular: no CP/no SOB/no palpitations/no leg swelling Respiratory: no cough/no SOB/no wheezing Gastrointestinal: no N/no V/no D/no C/no acid  reflux Musculoskeletal: no muscle aches/no joint aches Skin: no rashes, no hair loss Neurological: no tremors/no numbness/no tingling/no dizziness  I reviewed pt's medications, allergies, PMH, social hx, family hx, and changes were documented in the history of present illness. Otherwise, unchanged from my initial visit note.   Past Medical History:  Diagnosis Date  . 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 Chronic kidney disease   . Diabetes mellitus without complication (HCC)    type 2  . High cholesterol   . Hypertension   . Neuropathy    Past Surgical History:  Procedure Laterality Date  . ANKLE SURGERY     multiple ankle surgeries, with hardware and hardware removal at [facility]  . AV FISTULA PLACEMENT Right 11/09/2017   Procedure: Creation of Right Upper Arm Brachiocephalic ARTERIOVENOUS (AV) FISTULA;  Surgeon: Chuck Hintickson, Christopher S, MD;  [facility];  Service: Vascular;  Laterality: Right;  . CARPAL TUNNEL RELEASE Bilateral   . COLONOSCOPY     Social History   Social History  . Marital status: Married    Spouse name: N/A  . Number of children: 2   Occupational History  . Out of work   Social History Main Topics  . Smoking status: Former Smoker - quit 2004    Years: 10.00  . Smokeless tobacco: Never Used  . Alcohol use No  . Drug use: No   Current Outpatient Medications on File Prior to Visit  Medication Sig Dispense Refill  . BD PEN NEEDLE NANO 2ND GEN 32G X 4 MM MISC 1 EACH BY DOES NOT APPLY ROUTE DAILY. WITH BASAGLAR PEN 100 each 11  . famotidine (PEPCID) 20 MG tablet Take 20 mg by mouth at bedtime as needed.    . furosemide (LASIX) 80 MG tablet Take by mouth.    . gabapentin (NEURONTIN) 100 MG capsule Take 100 mg by mouth 2 (two) times daily.    Marland Kitchen. glipiZIDE (GLUCOTROL) 5 MG tablet Take 1 tablet (5 mg total) by mouth 2 (two) times daily before a meal. 180 tablet 3  . Insulin Glargine (BASAGLAR KWIKPEN) 100 UNIT/ML SOPN INJECT 28 UNITS  INTO THE SKIN AT BEDTIME. (Patient taking differently: INJECT 36 UNITS INTO THE SKIN AT BEDTIME.) 45 mL 3  . lisinopril (ZESTRIL) 40 MG tablet Take 40 mg by mouth daily.    . multivitamin (RENA-VIT) TABS tablet Take 1 tablet by mouth daily.    . rosuvastatin (CRESTOR) 10 MG tablet Take 10 mg by mouth daily.    . Semaglutide, 1 MG/DOSE, (OZEMPIC, 1 MG/DOSE,) 2 MG/1.5ML SOPN Inject 1 mg into the skin once a week. 6 pen 3  . sevelamer carbonate (RENVELA) 800 MG tablet TAKE 2 TABS WITH EVERY MEAL AND ONE TAB WITH SNACKS     No current facility-administered medications on file prior to visit.   No Known Allergies Family History  Problem Relation Age of Onset  . Heart failure Mother    Also; HTN, HL, heart ds, lung CA in  Mother  PE: BP 132/82   Pulse 87   Ht 5\' 11"  (1.803 m)   Wt 292 lb 14.4 oz (132.9 kg) Comment: calculated 7.7 prosthetic leg weight  SpO2 95%   BMI 40.85 kg/m  Wt Readings from Last 3 Encounters:  01/30/20 292 lb 14.4 oz (132.9 kg)  08/01/19 (!) 301 lb 8 oz (136.8 kg)  05/02/19 287 lb (130.2 kg)   Constitutional: overweight, in NAD Eyes: PERRLA, EOMI, no exophthalmos ENT: moist mucous membranes, no thyromegaly, no cervical lymphadenopathy Cardiovascular: RRR, No MRG Respiratory: CTA B Gastrointestinal: abdomen soft, NT, ND, BS+ Musculoskeletal: + deformities (left BKA), strength intact in all 4 Skin: moist, warm, no rashes Neurological: no tremor with outstretched hands, DTR normal in all 4  ASSESSMENT: 1. DM2, insulin-dependent, uncontrolled, with complications - PN - DR - CKD with proteinuria  - sees nephrology. 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  2. HL  3.  Obesity class III  PLAN:  1. Patient with longstanding, uncontrolled, type 2 diabetes, on basal insulin and GLP-1 receptor agonist along with sulfonylurea.   Sugars improved on a plant-based diet in the past so we can stop the sulfonylurea, however, afterwards, he relaxes diet and sugars increase so we added back glipizide.  At last visit, sugars were slightly better but he still had spikes in the 200s when he was forgetting 12/06/2016.  He was telling me that he usually forgot it in the night after dialysis as he went to bed early.  We discussed about the Basaglar at lunchtime.  He was also occasionally forgetting glipizide at night and we discussed about the importance of taking his avoid high blood sugars in the evening and subsequently in the morning.  At last visit, HbA1c was 8.2% and he has a recent HbA1c earlier this month visit which was only slightly better, at 8.1%. -At this visit, he does not have recent blood sugars after he started on the weight loss program.  He already lost approximately 9 pounds so I do expect his blood sugars to have improved.  Before he started the program, his sugars were at goal in the morning but they were increasing especially after lunch.  Since then, his meals have changed and he also will start going to the gym so we decided not to make any changes in his regimen pending more data.  He needs to let me know exactly what the steps he is using-he was sent me a message through my chart after he gets home. - I suggested to:  Patient Instructions  Please continue: - Basaglar 36 units at lunchtime - Ozempic 1 mg weekly  - Glipizide 5 mg before b'fast (only in non-dialysis days) and before dinner   Please come back for a follow-up appointment in 3 months.  - advised to check sugars at different times of the day - 1x a day, rotating check times - advised for yearly eye exams >> he is UTD - return to clinic in 3 months  2. HL - Reviewed latest lipid panel from 01/2020: LDL above goal, but improved triglycerides higher - Continues Crestor 10 without side effects.  This was changed from Lipitor last year.  3.  Obesity class  III -He needs to lose 30 to 30 pounds to qualify for a kidney transplant. -Continues Ozempic which should also help with weight loss -However, at last visit, he gained 14 pounds since the previous visit,  but the previous weight loss could have been fluid weight\ - he lost 9 lbs since last OV after starting to go to [facility].  He is also contemplating gastric bypass surgery.  Carlus Pavlov, MD PhD [facility] Endocrinology

## 2020-01-30 NOTE — Patient Instructions (Signed)
Please continue: - Basaglar 36 units at lunchtime - Ozempic 1 mg weekly  - Glipizide 5 mg before b'fast (only in non-dialysis days) and before dinner   Please come back for a follow-up appointment in 3 months.

## 2020-02-02 DIAGNOSIS — D631 Anemia in chronic kidney disease: Secondary | ICD-10-CM | POA: Diagnosis not present

## 2020-02-02 DIAGNOSIS — N186 End stage renal disease: Secondary | ICD-10-CM | POA: Diagnosis not present

## 2020-02-02 DIAGNOSIS — D509 Iron deficiency anemia, unspecified: Secondary | ICD-10-CM | POA: Diagnosis not present

## 2020-02-05 DIAGNOSIS — N186 End stage renal disease: Secondary | ICD-10-CM | POA: Diagnosis not present

## 2020-02-05 DIAGNOSIS — D631 Anemia in chronic kidney disease: Secondary | ICD-10-CM | POA: Diagnosis not present

## 2020-02-05 DIAGNOSIS — D509 Iron deficiency anemia, unspecified: Secondary | ICD-10-CM | POA: Diagnosis not present

## 2020-02-06 DIAGNOSIS — G478 Other sleep disorders: Secondary | ICD-10-CM | POA: Diagnosis not present

## 2020-02-06 DIAGNOSIS — R0683 Snoring: Secondary | ICD-10-CM | POA: Diagnosis not present

## 2020-02-06 DIAGNOSIS — Z992 Dependence on renal dialysis: Secondary | ICD-10-CM | POA: Diagnosis not present

## 2020-02-06 DIAGNOSIS — G4719 Other hypersomnia: Secondary | ICD-10-CM | POA: Diagnosis not present

## 2020-02-06 DIAGNOSIS — N186 End stage renal disease: Secondary | ICD-10-CM | POA: Diagnosis not present

## 2020-02-06 DIAGNOSIS — R29818 Other symptoms and signs involving the nervous system: Secondary | ICD-10-CM | POA: Diagnosis not present

## 2020-02-07 DIAGNOSIS — D509 Iron deficiency anemia, unspecified: Secondary | ICD-10-CM | POA: Diagnosis not present

## 2020-02-07 DIAGNOSIS — N186 End stage renal disease: Secondary | ICD-10-CM | POA: Diagnosis not present

## 2020-02-07 DIAGNOSIS — D631 Anemia in chronic kidney disease: Secondary | ICD-10-CM | POA: Diagnosis not present

## 2020-02-07 DIAGNOSIS — E119 Type 2 diabetes mellitus without complications: Secondary | ICD-10-CM | POA: Diagnosis not present

## 2020-02-09 DIAGNOSIS — D509 Iron deficiency anemia, unspecified: Secondary | ICD-10-CM | POA: Diagnosis not present

## 2020-02-09 DIAGNOSIS — D631 Anemia in chronic kidney disease: Secondary | ICD-10-CM | POA: Diagnosis not present

## 2020-02-09 DIAGNOSIS — N186 End stage renal disease: Secondary | ICD-10-CM | POA: Diagnosis not present

## 2020-02-12 DIAGNOSIS — D631 Anemia in chronic kidney disease: Secondary | ICD-10-CM | POA: Diagnosis not present

## 2020-02-12 DIAGNOSIS — D509 Iron deficiency anemia, unspecified: Secondary | ICD-10-CM | POA: Diagnosis not present

## 2020-02-12 DIAGNOSIS — N186 End stage renal disease: Secondary | ICD-10-CM | POA: Diagnosis not present

## 2020-02-13 DIAGNOSIS — G8929 Other chronic pain: Secondary | ICD-10-CM | POA: Diagnosis not present

## 2020-02-13 DIAGNOSIS — Z4781 Encounter for orthopedic aftercare following surgical amputation: Secondary | ICD-10-CM | POA: Diagnosis not present

## 2020-02-13 DIAGNOSIS — M5459 Other low back pain: Secondary | ICD-10-CM | POA: Diagnosis not present

## 2020-02-13 DIAGNOSIS — Z89512 Acquired absence of left leg below knee: Secondary | ICD-10-CM | POA: Diagnosis not present

## 2020-02-14 DIAGNOSIS — D509 Iron deficiency anemia, unspecified: Secondary | ICD-10-CM | POA: Diagnosis not present

## 2020-02-14 DIAGNOSIS — D631 Anemia in chronic kidney disease: Secondary | ICD-10-CM | POA: Diagnosis not present

## 2020-02-14 DIAGNOSIS — N186 End stage renal disease: Secondary | ICD-10-CM | POA: Diagnosis not present

## 2020-02-16 DIAGNOSIS — N186 End stage renal disease: Secondary | ICD-10-CM | POA: Diagnosis not present

## 2020-02-16 DIAGNOSIS — D509 Iron deficiency anemia, unspecified: Secondary | ICD-10-CM | POA: Diagnosis not present

## 2020-02-16 DIAGNOSIS — D631 Anemia in chronic kidney disease: Secondary | ICD-10-CM | POA: Diagnosis not present

## 2020-02-19 DIAGNOSIS — N186 End stage renal disease: Secondary | ICD-10-CM | POA: Diagnosis not present

## 2020-02-21 DIAGNOSIS — N186 End stage renal disease: Secondary | ICD-10-CM | POA: Diagnosis not present

## 2020-02-23 DIAGNOSIS — N186 End stage renal disease: Secondary | ICD-10-CM | POA: Diagnosis not present

## 2020-02-26 DIAGNOSIS — N186 End stage renal disease: Secondary | ICD-10-CM | POA: Diagnosis not present

## 2020-02-27 DIAGNOSIS — Z4781 Encounter for orthopedic aftercare following surgical amputation: Secondary | ICD-10-CM | POA: Diagnosis not present

## 2020-02-27 DIAGNOSIS — M5459 Other low back pain: Secondary | ICD-10-CM | POA: Diagnosis not present

## 2020-02-27 DIAGNOSIS — G8929 Other chronic pain: Secondary | ICD-10-CM | POA: Diagnosis not present

## 2020-02-27 DIAGNOSIS — Z89512 Acquired absence of left leg below knee: Secondary | ICD-10-CM | POA: Diagnosis not present

## 2020-02-28 DIAGNOSIS — D631 Anemia in chronic kidney disease: Secondary | ICD-10-CM | POA: Diagnosis not present

## 2020-02-28 DIAGNOSIS — N186 End stage renal disease: Secondary | ICD-10-CM | POA: Diagnosis not present

## 2020-03-01 DIAGNOSIS — D631 Anemia in chronic kidney disease: Secondary | ICD-10-CM | POA: Diagnosis not present

## 2020-03-01 DIAGNOSIS — N186 End stage renal disease: Secondary | ICD-10-CM | POA: Diagnosis not present

## 2020-03-04 DIAGNOSIS — N186 End stage renal disease: Secondary | ICD-10-CM | POA: Diagnosis not present

## 2020-03-04 DIAGNOSIS — D631 Anemia in chronic kidney disease: Secondary | ICD-10-CM | POA: Diagnosis not present

## 2020-03-05 DIAGNOSIS — Z4781 Encounter for orthopedic aftercare following surgical amputation: Secondary | ICD-10-CM | POA: Diagnosis not present

## 2020-03-05 DIAGNOSIS — M5459 Other low back pain: Secondary | ICD-10-CM | POA: Diagnosis not present

## 2020-03-05 DIAGNOSIS — Z89512 Acquired absence of left leg below knee: Secondary | ICD-10-CM | POA: Diagnosis not present

## 2020-03-05 DIAGNOSIS — G8929 Other chronic pain: Secondary | ICD-10-CM | POA: Diagnosis not present

## 2020-03-06 DIAGNOSIS — D631 Anemia in chronic kidney disease: Secondary | ICD-10-CM | POA: Diagnosis not present

## 2020-03-06 DIAGNOSIS — N186 End stage renal disease: Secondary | ICD-10-CM | POA: Diagnosis not present

## 2020-03-08 DIAGNOSIS — D631 Anemia in chronic kidney disease: Secondary | ICD-10-CM | POA: Diagnosis not present

## 2020-03-08 DIAGNOSIS — Z992 Dependence on renal dialysis: Secondary | ICD-10-CM | POA: Diagnosis not present

## 2020-03-08 DIAGNOSIS — N186 End stage renal disease: Secondary | ICD-10-CM | POA: Diagnosis not present

## 2020-03-11 DIAGNOSIS — N186 End stage renal disease: Secondary | ICD-10-CM | POA: Diagnosis not present

## 2020-03-11 DIAGNOSIS — D509 Iron deficiency anemia, unspecified: Secondary | ICD-10-CM | POA: Diagnosis not present

## 2020-03-11 DIAGNOSIS — N2581 Secondary hyperparathyroidism of renal origin: Secondary | ICD-10-CM | POA: Diagnosis not present

## 2020-03-11 DIAGNOSIS — D631 Anemia in chronic kidney disease: Secondary | ICD-10-CM | POA: Diagnosis not present

## 2020-03-12 DIAGNOSIS — Z89512 Acquired absence of left leg below knee: Secondary | ICD-10-CM | POA: Diagnosis not present

## 2020-03-12 DIAGNOSIS — M5459 Other low back pain: Secondary | ICD-10-CM | POA: Diagnosis not present

## 2020-03-12 DIAGNOSIS — Z4781 Encounter for orthopedic aftercare following surgical amputation: Secondary | ICD-10-CM | POA: Diagnosis not present

## 2020-03-13 DIAGNOSIS — D509 Iron deficiency anemia, unspecified: Secondary | ICD-10-CM | POA: Diagnosis not present

## 2020-03-13 DIAGNOSIS — N2581 Secondary hyperparathyroidism of renal origin: Secondary | ICD-10-CM | POA: Diagnosis not present

## 2020-03-13 DIAGNOSIS — E119 Type 2 diabetes mellitus without complications: Secondary | ICD-10-CM | POA: Diagnosis not present

## 2020-03-13 DIAGNOSIS — D631 Anemia in chronic kidney disease: Secondary | ICD-10-CM | POA: Diagnosis not present

## 2020-03-13 DIAGNOSIS — N186 End stage renal disease: Secondary | ICD-10-CM | POA: Diagnosis not present

## 2020-03-15 DIAGNOSIS — N2581 Secondary hyperparathyroidism of renal origin: Secondary | ICD-10-CM | POA: Diagnosis not present

## 2020-03-15 DIAGNOSIS — N186 End stage renal disease: Secondary | ICD-10-CM | POA: Diagnosis not present

## 2020-03-15 DIAGNOSIS — D631 Anemia in chronic kidney disease: Secondary | ICD-10-CM | POA: Diagnosis not present

## 2020-03-15 DIAGNOSIS — D509 Iron deficiency anemia, unspecified: Secondary | ICD-10-CM | POA: Diagnosis not present

## 2020-03-18 DIAGNOSIS — D509 Iron deficiency anemia, unspecified: Secondary | ICD-10-CM | POA: Diagnosis not present

## 2020-03-18 DIAGNOSIS — N186 End stage renal disease: Secondary | ICD-10-CM | POA: Diagnosis not present

## 2020-03-18 DIAGNOSIS — D631 Anemia in chronic kidney disease: Secondary | ICD-10-CM | POA: Diagnosis not present

## 2020-03-18 DIAGNOSIS — N2581 Secondary hyperparathyroidism of renal origin: Secondary | ICD-10-CM | POA: Diagnosis not present

## 2020-03-19 DIAGNOSIS — Z89512 Acquired absence of left leg below knee: Secondary | ICD-10-CM | POA: Diagnosis not present

## 2020-03-19 DIAGNOSIS — E1142 Type 2 diabetes mellitus with diabetic polyneuropathy: Secondary | ICD-10-CM | POA: Diagnosis not present

## 2020-03-19 DIAGNOSIS — M5459 Other low back pain: Secondary | ICD-10-CM | POA: Diagnosis not present

## 2020-03-19 DIAGNOSIS — Z5181 Encounter for therapeutic drug level monitoring: Secondary | ICD-10-CM | POA: Diagnosis not present

## 2020-03-19 DIAGNOSIS — Z79899 Other long term (current) drug therapy: Secondary | ICD-10-CM | POA: Diagnosis not present

## 2020-03-19 DIAGNOSIS — Z4781 Encounter for orthopedic aftercare following surgical amputation: Secondary | ICD-10-CM | POA: Diagnosis not present

## 2020-03-20 DIAGNOSIS — D509 Iron deficiency anemia, unspecified: Secondary | ICD-10-CM | POA: Diagnosis not present

## 2020-03-20 DIAGNOSIS — D631 Anemia in chronic kidney disease: Secondary | ICD-10-CM | POA: Diagnosis not present

## 2020-03-20 DIAGNOSIS — N186 End stage renal disease: Secondary | ICD-10-CM | POA: Diagnosis not present

## 2020-03-22 DIAGNOSIS — D509 Iron deficiency anemia, unspecified: Secondary | ICD-10-CM | POA: Diagnosis not present

## 2020-03-22 DIAGNOSIS — D631 Anemia in chronic kidney disease: Secondary | ICD-10-CM | POA: Diagnosis not present

## 2020-03-22 DIAGNOSIS — N186 End stage renal disease: Secondary | ICD-10-CM | POA: Diagnosis not present

## 2020-03-25 DIAGNOSIS — D509 Iron deficiency anemia, unspecified: Secondary | ICD-10-CM | POA: Diagnosis not present

## 2020-03-25 DIAGNOSIS — N186 End stage renal disease: Secondary | ICD-10-CM | POA: Diagnosis not present

## 2020-03-25 DIAGNOSIS — D631 Anemia in chronic kidney disease: Secondary | ICD-10-CM | POA: Diagnosis not present

## 2020-03-26 DIAGNOSIS — Z4781 Encounter for orthopedic aftercare following surgical amputation: Secondary | ICD-10-CM | POA: Diagnosis not present

## 2020-03-26 DIAGNOSIS — Z89512 Acquired absence of left leg below knee: Secondary | ICD-10-CM | POA: Diagnosis not present

## 2020-03-26 DIAGNOSIS — M5459 Other low back pain: Secondary | ICD-10-CM | POA: Diagnosis not present

## 2020-03-27 DIAGNOSIS — D509 Iron deficiency anemia, unspecified: Secondary | ICD-10-CM | POA: Diagnosis not present

## 2020-03-27 DIAGNOSIS — D631 Anemia in chronic kidney disease: Secondary | ICD-10-CM | POA: Diagnosis not present

## 2020-03-27 DIAGNOSIS — N186 End stage renal disease: Secondary | ICD-10-CM | POA: Diagnosis not present

## 2020-03-29 DIAGNOSIS — N186 End stage renal disease: Secondary | ICD-10-CM | POA: Diagnosis not present

## 2020-04-01 DIAGNOSIS — N186 End stage renal disease: Secondary | ICD-10-CM | POA: Diagnosis not present

## 2020-04-02 DIAGNOSIS — Z4781 Encounter for orthopedic aftercare following surgical amputation: Secondary | ICD-10-CM | POA: Diagnosis not present

## 2020-04-02 DIAGNOSIS — M5459 Other low back pain: Secondary | ICD-10-CM | POA: Diagnosis not present

## 2020-04-02 DIAGNOSIS — Z89512 Acquired absence of left leg below knee: Secondary | ICD-10-CM | POA: Diagnosis not present

## 2020-04-03 DIAGNOSIS — Z114 Encounter for screening for human immunodeficiency virus [HIV]: Secondary | ICD-10-CM | POA: Diagnosis not present

## 2020-04-03 DIAGNOSIS — Z1159 Encounter for screening for other viral diseases: Secondary | ICD-10-CM | POA: Diagnosis not present

## 2020-04-03 DIAGNOSIS — N186 End stage renal disease: Secondary | ICD-10-CM | POA: Diagnosis not present

## 2020-04-04 DIAGNOSIS — Z794 Long term (current) use of insulin: Secondary | ICD-10-CM | POA: Diagnosis not present

## 2020-04-04 DIAGNOSIS — M47816 Spondylosis without myelopathy or radiculopathy, lumbar region: Secondary | ICD-10-CM | POA: Diagnosis not present

## 2020-04-04 DIAGNOSIS — E1165 Type 2 diabetes mellitus with hyperglycemia: Secondary | ICD-10-CM | POA: Diagnosis not present

## 2020-04-04 DIAGNOSIS — E1142 Type 2 diabetes mellitus with diabetic polyneuropathy: Secondary | ICD-10-CM | POA: Diagnosis not present

## 2020-04-05 DIAGNOSIS — N186 End stage renal disease: Secondary | ICD-10-CM | POA: Diagnosis not present

## 2020-04-08 DIAGNOSIS — Z992 Dependence on renal dialysis: Secondary | ICD-10-CM | POA: Diagnosis not present

## 2020-04-08 DIAGNOSIS — N186 End stage renal disease: Secondary | ICD-10-CM | POA: Diagnosis not present

## 2020-04-10 DIAGNOSIS — Z23 Encounter for immunization: Secondary | ICD-10-CM | POA: Diagnosis not present

## 2020-04-10 DIAGNOSIS — N186 End stage renal disease: Secondary | ICD-10-CM | POA: Diagnosis not present

## 2020-04-10 DIAGNOSIS — E1122 Type 2 diabetes mellitus with diabetic chronic kidney disease: Secondary | ICD-10-CM | POA: Diagnosis not present

## 2020-04-10 DIAGNOSIS — D509 Iron deficiency anemia, unspecified: Secondary | ICD-10-CM | POA: Diagnosis not present

## 2020-04-10 DIAGNOSIS — N2581 Secondary hyperparathyroidism of renal origin: Secondary | ICD-10-CM | POA: Diagnosis not present

## 2020-04-12 DIAGNOSIS — D509 Iron deficiency anemia, unspecified: Secondary | ICD-10-CM | POA: Diagnosis not present

## 2020-04-12 DIAGNOSIS — Z23 Encounter for immunization: Secondary | ICD-10-CM | POA: Diagnosis not present

## 2020-04-12 DIAGNOSIS — N186 End stage renal disease: Secondary | ICD-10-CM | POA: Diagnosis not present

## 2020-04-12 DIAGNOSIS — N2581 Secondary hyperparathyroidism of renal origin: Secondary | ICD-10-CM | POA: Diagnosis not present

## 2020-04-15 DIAGNOSIS — N186 End stage renal disease: Secondary | ICD-10-CM | POA: Diagnosis not present

## 2020-04-15 DIAGNOSIS — Z23 Encounter for immunization: Secondary | ICD-10-CM | POA: Diagnosis not present

## 2020-04-15 DIAGNOSIS — D509 Iron deficiency anemia, unspecified: Secondary | ICD-10-CM | POA: Diagnosis not present

## 2020-04-15 DIAGNOSIS — N2581 Secondary hyperparathyroidism of renal origin: Secondary | ICD-10-CM | POA: Diagnosis not present

## 2020-04-16 DIAGNOSIS — M25552 Pain in left hip: Secondary | ICD-10-CM | POA: Diagnosis not present

## 2020-04-16 DIAGNOSIS — M5386 Other specified dorsopathies, lumbar region: Secondary | ICD-10-CM | POA: Diagnosis not present

## 2020-04-16 DIAGNOSIS — M25551 Pain in right hip: Secondary | ICD-10-CM | POA: Diagnosis not present

## 2020-04-16 DIAGNOSIS — M542 Cervicalgia: Secondary | ICD-10-CM | POA: Diagnosis not present

## 2020-04-17 DIAGNOSIS — D509 Iron deficiency anemia, unspecified: Secondary | ICD-10-CM | POA: Diagnosis not present

## 2020-04-17 DIAGNOSIS — N186 End stage renal disease: Secondary | ICD-10-CM | POA: Diagnosis not present

## 2020-04-17 DIAGNOSIS — N2581 Secondary hyperparathyroidism of renal origin: Secondary | ICD-10-CM | POA: Diagnosis not present

## 2020-04-17 DIAGNOSIS — Z23 Encounter for immunization: Secondary | ICD-10-CM | POA: Diagnosis not present

## 2020-04-19 DIAGNOSIS — N186 End stage renal disease: Secondary | ICD-10-CM | POA: Diagnosis not present

## 2020-04-19 DIAGNOSIS — N2581 Secondary hyperparathyroidism of renal origin: Secondary | ICD-10-CM | POA: Diagnosis not present

## 2020-04-19 DIAGNOSIS — D509 Iron deficiency anemia, unspecified: Secondary | ICD-10-CM | POA: Diagnosis not present

## 2020-04-22 DIAGNOSIS — D509 Iron deficiency anemia, unspecified: Secondary | ICD-10-CM | POA: Diagnosis not present

## 2020-04-22 DIAGNOSIS — N2581 Secondary hyperparathyroidism of renal origin: Secondary | ICD-10-CM | POA: Diagnosis not present

## 2020-04-22 DIAGNOSIS — N186 End stage renal disease: Secondary | ICD-10-CM | POA: Diagnosis not present

## 2020-04-24 DIAGNOSIS — N186 End stage renal disease: Secondary | ICD-10-CM | POA: Diagnosis not present

## 2020-04-24 DIAGNOSIS — N2581 Secondary hyperparathyroidism of renal origin: Secondary | ICD-10-CM | POA: Diagnosis not present

## 2020-04-24 DIAGNOSIS — D509 Iron deficiency anemia, unspecified: Secondary | ICD-10-CM | POA: Diagnosis not present

## 2020-04-25 DIAGNOSIS — Z89512 Acquired absence of left leg below knee: Secondary | ICD-10-CM | POA: Diagnosis not present

## 2020-04-26 DIAGNOSIS — N186 End stage renal disease: Secondary | ICD-10-CM | POA: Diagnosis not present

## 2020-04-26 DIAGNOSIS — N2581 Secondary hyperparathyroidism of renal origin: Secondary | ICD-10-CM | POA: Diagnosis not present

## 2020-04-26 DIAGNOSIS — D509 Iron deficiency anemia, unspecified: Secondary | ICD-10-CM | POA: Diagnosis not present

## 2020-04-29 DIAGNOSIS — N2581 Secondary hyperparathyroidism of renal origin: Secondary | ICD-10-CM | POA: Diagnosis not present

## 2020-04-29 DIAGNOSIS — N186 End stage renal disease: Secondary | ICD-10-CM | POA: Diagnosis not present

## 2020-04-30 DIAGNOSIS — M25552 Pain in left hip: Secondary | ICD-10-CM | POA: Diagnosis not present

## 2020-04-30 DIAGNOSIS — N186 End stage renal disease: Secondary | ICD-10-CM | POA: Diagnosis not present

## 2020-04-30 DIAGNOSIS — M25551 Pain in right hip: Secondary | ICD-10-CM | POA: Diagnosis not present

## 2020-04-30 DIAGNOSIS — M5386 Other specified dorsopathies, lumbar region: Secondary | ICD-10-CM | POA: Diagnosis not present

## 2020-04-30 DIAGNOSIS — M542 Cervicalgia: Secondary | ICD-10-CM | POA: Diagnosis not present

## 2020-04-30 DIAGNOSIS — M47816 Spondylosis without myelopathy or radiculopathy, lumbar region: Secondary | ICD-10-CM | POA: Diagnosis not present

## 2020-05-01 DIAGNOSIS — N2581 Secondary hyperparathyroidism of renal origin: Secondary | ICD-10-CM | POA: Diagnosis not present

## 2020-05-01 DIAGNOSIS — N186 End stage renal disease: Secondary | ICD-10-CM | POA: Diagnosis not present

## 2020-05-02 ENCOUNTER — Ambulatory Visit: Payer: BC Managed Care – PPO | Admitting: Internal Medicine

## 2020-05-03 DIAGNOSIS — N186 End stage renal disease: Secondary | ICD-10-CM | POA: Diagnosis not present

## 2020-05-03 DIAGNOSIS — N2581 Secondary hyperparathyroidism of renal origin: Secondary | ICD-10-CM | POA: Diagnosis not present

## 2020-05-06 DIAGNOSIS — Z992 Dependence on renal dialysis: Secondary | ICD-10-CM | POA: Diagnosis not present

## 2020-05-06 DIAGNOSIS — N2581 Secondary hyperparathyroidism of renal origin: Secondary | ICD-10-CM | POA: Diagnosis not present

## 2020-05-06 DIAGNOSIS — N186 End stage renal disease: Secondary | ICD-10-CM | POA: Diagnosis not present

## 2020-05-07 DIAGNOSIS — M47816 Spondylosis without myelopathy or radiculopathy, lumbar region: Secondary | ICD-10-CM | POA: Diagnosis not present

## 2020-05-07 DIAGNOSIS — I739 Peripheral vascular disease, unspecified: Secondary | ICD-10-CM | POA: Diagnosis not present

## 2020-05-07 DIAGNOSIS — M5386 Other specified dorsopathies, lumbar region: Secondary | ICD-10-CM | POA: Diagnosis not present

## 2020-05-07 DIAGNOSIS — Z9714 Presence of artificial left leg (complete) (partial): Secondary | ICD-10-CM | POA: Diagnosis not present

## 2020-05-07 DIAGNOSIS — N186 End stage renal disease: Secondary | ICD-10-CM | POA: Diagnosis not present

## 2020-05-07 DIAGNOSIS — Z89512 Acquired absence of left leg below knee: Secondary | ICD-10-CM | POA: Diagnosis not present

## 2020-05-07 DIAGNOSIS — Z48812 Encounter for surgical aftercare following surgery on the circulatory system: Secondary | ICD-10-CM | POA: Diagnosis not present

## 2020-05-08 ENCOUNTER — Other Ambulatory Visit: Payer: Self-pay | Admitting: Internal Medicine

## 2020-05-08 DIAGNOSIS — N186 End stage renal disease: Secondary | ICD-10-CM | POA: Diagnosis not present

## 2020-05-08 DIAGNOSIS — E1122 Type 2 diabetes mellitus with diabetic chronic kidney disease: Secondary | ICD-10-CM | POA: Diagnosis not present

## 2020-05-08 DIAGNOSIS — D509 Iron deficiency anemia, unspecified: Secondary | ICD-10-CM | POA: Diagnosis not present

## 2020-05-08 DIAGNOSIS — N2581 Secondary hyperparathyroidism of renal origin: Secondary | ICD-10-CM | POA: Diagnosis not present

## 2020-05-10 DIAGNOSIS — D509 Iron deficiency anemia, unspecified: Secondary | ICD-10-CM | POA: Diagnosis not present

## 2020-05-10 DIAGNOSIS — N186 End stage renal disease: Secondary | ICD-10-CM | POA: Diagnosis not present

## 2020-05-10 DIAGNOSIS — N2581 Secondary hyperparathyroidism of renal origin: Secondary | ICD-10-CM | POA: Diagnosis not present

## 2020-05-11 DIAGNOSIS — I10 Essential (primary) hypertension: Secondary | ICD-10-CM | POA: Diagnosis not present

## 2020-05-11 DIAGNOSIS — H5712 Ocular pain, left eye: Secondary | ICD-10-CM | POA: Diagnosis not present

## 2020-05-11 DIAGNOSIS — H539 Unspecified visual disturbance: Secondary | ICD-10-CM | POA: Diagnosis not present

## 2020-05-11 DIAGNOSIS — H43393 Other vitreous opacities, bilateral: Secondary | ICD-10-CM | POA: Diagnosis not present

## 2020-05-11 DIAGNOSIS — H538 Other visual disturbances: Secondary | ICD-10-CM | POA: Diagnosis not present

## 2020-05-11 DIAGNOSIS — R519 Headache, unspecified: Secondary | ICD-10-CM | POA: Diagnosis not present

## 2020-05-12 DIAGNOSIS — H4312 Vitreous hemorrhage, left eye: Secondary | ICD-10-CM | POA: Diagnosis not present

## 2020-05-12 DIAGNOSIS — E113592 Type 2 diabetes mellitus with proliferative diabetic retinopathy without macular edema, left eye: Secondary | ICD-10-CM | POA: Diagnosis not present

## 2020-05-12 DIAGNOSIS — E113411 Type 2 diabetes mellitus with severe nonproliferative diabetic retinopathy with macular edema, right eye: Secondary | ICD-10-CM | POA: Diagnosis not present

## 2020-05-12 DIAGNOSIS — H35033 Hypertensive retinopathy, bilateral: Secondary | ICD-10-CM | POA: Diagnosis not present

## 2020-05-12 DIAGNOSIS — H3582 Retinal ischemia: Secondary | ICD-10-CM | POA: Diagnosis not present

## 2020-05-13 DIAGNOSIS — N186 End stage renal disease: Secondary | ICD-10-CM | POA: Diagnosis not present

## 2020-05-13 DIAGNOSIS — N2581 Secondary hyperparathyroidism of renal origin: Secondary | ICD-10-CM | POA: Diagnosis not present

## 2020-05-13 DIAGNOSIS — D509 Iron deficiency anemia, unspecified: Secondary | ICD-10-CM | POA: Diagnosis not present

## 2020-05-14 DIAGNOSIS — N186 End stage renal disease: Secondary | ICD-10-CM | POA: Diagnosis not present

## 2020-05-14 DIAGNOSIS — E113592 Type 2 diabetes mellitus with proliferative diabetic retinopathy without macular edema, left eye: Secondary | ICD-10-CM | POA: Diagnosis not present

## 2020-05-14 DIAGNOSIS — I10 Essential (primary) hypertension: Secondary | ICD-10-CM | POA: Diagnosis not present

## 2020-05-14 DIAGNOSIS — E1142 Type 2 diabetes mellitus with diabetic polyneuropathy: Secondary | ICD-10-CM | POA: Diagnosis not present

## 2020-05-14 DIAGNOSIS — E1165 Type 2 diabetes mellitus with hyperglycemia: Secondary | ICD-10-CM | POA: Diagnosis not present

## 2020-05-14 DIAGNOSIS — Z794 Long term (current) use of insulin: Secondary | ICD-10-CM | POA: Diagnosis not present

## 2020-05-14 DIAGNOSIS — Z6841 Body Mass Index (BMI) 40.0 and over, adult: Secondary | ICD-10-CM | POA: Diagnosis not present

## 2020-05-15 DIAGNOSIS — N186 End stage renal disease: Secondary | ICD-10-CM | POA: Diagnosis not present

## 2020-05-15 DIAGNOSIS — N2581 Secondary hyperparathyroidism of renal origin: Secondary | ICD-10-CM | POA: Diagnosis not present

## 2020-05-15 DIAGNOSIS — D509 Iron deficiency anemia, unspecified: Secondary | ICD-10-CM | POA: Diagnosis not present

## 2020-05-17 DIAGNOSIS — N2581 Secondary hyperparathyroidism of renal origin: Secondary | ICD-10-CM | POA: Diagnosis not present

## 2020-05-17 DIAGNOSIS — D509 Iron deficiency anemia, unspecified: Secondary | ICD-10-CM | POA: Diagnosis not present

## 2020-05-17 DIAGNOSIS — N186 End stage renal disease: Secondary | ICD-10-CM | POA: Diagnosis not present

## 2020-05-20 DIAGNOSIS — D509 Iron deficiency anemia, unspecified: Secondary | ICD-10-CM | POA: Diagnosis not present

## 2020-05-20 DIAGNOSIS — N2581 Secondary hyperparathyroidism of renal origin: Secondary | ICD-10-CM | POA: Diagnosis not present

## 2020-05-20 DIAGNOSIS — N186 End stage renal disease: Secondary | ICD-10-CM | POA: Diagnosis not present

## 2020-05-22 DIAGNOSIS — N186 End stage renal disease: Secondary | ICD-10-CM | POA: Diagnosis not present

## 2020-05-22 DIAGNOSIS — N2581 Secondary hyperparathyroidism of renal origin: Secondary | ICD-10-CM | POA: Diagnosis not present

## 2020-05-22 DIAGNOSIS — D509 Iron deficiency anemia, unspecified: Secondary | ICD-10-CM | POA: Diagnosis not present

## 2020-05-22 DIAGNOSIS — E1122 Type 2 diabetes mellitus with diabetic chronic kidney disease: Secondary | ICD-10-CM | POA: Diagnosis not present

## 2020-05-24 DIAGNOSIS — N186 End stage renal disease: Secondary | ICD-10-CM | POA: Diagnosis not present

## 2020-05-24 DIAGNOSIS — N2581 Secondary hyperparathyroidism of renal origin: Secondary | ICD-10-CM | POA: Diagnosis not present

## 2020-05-24 DIAGNOSIS — D509 Iron deficiency anemia, unspecified: Secondary | ICD-10-CM | POA: Diagnosis not present

## 2020-05-27 DIAGNOSIS — N186 End stage renal disease: Secondary | ICD-10-CM | POA: Diagnosis not present

## 2020-05-27 DIAGNOSIS — D631 Anemia in chronic kidney disease: Secondary | ICD-10-CM | POA: Diagnosis not present

## 2020-05-27 DIAGNOSIS — N2581 Secondary hyperparathyroidism of renal origin: Secondary | ICD-10-CM | POA: Diagnosis not present

## 2020-05-29 DIAGNOSIS — G478 Other sleep disorders: Secondary | ICD-10-CM | POA: Diagnosis not present

## 2020-05-29 DIAGNOSIS — R29818 Other symptoms and signs involving the nervous system: Secondary | ICD-10-CM | POA: Diagnosis not present

## 2020-05-29 DIAGNOSIS — Z6841 Body Mass Index (BMI) 40.0 and over, adult: Secondary | ICD-10-CM | POA: Diagnosis not present

## 2020-05-29 DIAGNOSIS — R5383 Other fatigue: Secondary | ICD-10-CM | POA: Diagnosis not present

## 2020-05-29 DIAGNOSIS — G4719 Other hypersomnia: Secondary | ICD-10-CM | POA: Diagnosis not present

## 2020-05-29 DIAGNOSIS — I10 Essential (primary) hypertension: Secondary | ICD-10-CM | POA: Diagnosis not present

## 2020-05-29 DIAGNOSIS — N186 End stage renal disease: Secondary | ICD-10-CM | POA: Diagnosis not present

## 2020-05-29 DIAGNOSIS — R0683 Snoring: Secondary | ICD-10-CM | POA: Diagnosis not present

## 2020-05-29 DIAGNOSIS — D631 Anemia in chronic kidney disease: Secondary | ICD-10-CM | POA: Diagnosis not present

## 2020-05-29 DIAGNOSIS — N2581 Secondary hyperparathyroidism of renal origin: Secondary | ICD-10-CM | POA: Diagnosis not present

## 2020-05-31 DIAGNOSIS — N186 End stage renal disease: Secondary | ICD-10-CM | POA: Diagnosis not present

## 2020-05-31 DIAGNOSIS — D631 Anemia in chronic kidney disease: Secondary | ICD-10-CM | POA: Diagnosis not present

## 2020-05-31 DIAGNOSIS — N2581 Secondary hyperparathyroidism of renal origin: Secondary | ICD-10-CM | POA: Diagnosis not present

## 2020-06-03 DIAGNOSIS — D631 Anemia in chronic kidney disease: Secondary | ICD-10-CM | POA: Diagnosis not present

## 2020-06-03 DIAGNOSIS — N2581 Secondary hyperparathyroidism of renal origin: Secondary | ICD-10-CM | POA: Diagnosis not present

## 2020-06-03 DIAGNOSIS — N186 End stage renal disease: Secondary | ICD-10-CM | POA: Diagnosis not present

## 2020-06-04 DIAGNOSIS — I10 Essential (primary) hypertension: Secondary | ICD-10-CM | POA: Diagnosis not present

## 2020-06-04 DIAGNOSIS — N186 End stage renal disease: Secondary | ICD-10-CM | POA: Diagnosis not present

## 2020-06-04 DIAGNOSIS — G4731 Primary central sleep apnea: Secondary | ICD-10-CM | POA: Diagnosis not present

## 2020-06-05 DIAGNOSIS — N186 End stage renal disease: Secondary | ICD-10-CM | POA: Diagnosis not present

## 2020-06-05 DIAGNOSIS — N2581 Secondary hyperparathyroidism of renal origin: Secondary | ICD-10-CM | POA: Diagnosis not present

## 2020-06-05 DIAGNOSIS — D631 Anemia in chronic kidney disease: Secondary | ICD-10-CM | POA: Diagnosis not present

## 2020-06-06 DIAGNOSIS — Z992 Dependence on renal dialysis: Secondary | ICD-10-CM | POA: Diagnosis not present

## 2020-06-06 DIAGNOSIS — N186 End stage renal disease: Secondary | ICD-10-CM | POA: Diagnosis not present

## 2020-06-07 DIAGNOSIS — D509 Iron deficiency anemia, unspecified: Secondary | ICD-10-CM | POA: Diagnosis not present

## 2020-06-07 DIAGNOSIS — D631 Anemia in chronic kidney disease: Secondary | ICD-10-CM | POA: Diagnosis not present

## 2020-06-07 DIAGNOSIS — E1140 Type 2 diabetes mellitus with diabetic neuropathy, unspecified: Secondary | ICD-10-CM | POA: Diagnosis not present

## 2020-06-07 DIAGNOSIS — E782 Mixed hyperlipidemia: Secondary | ICD-10-CM | POA: Diagnosis not present

## 2020-06-07 DIAGNOSIS — Z01818 Encounter for other preprocedural examination: Secondary | ICD-10-CM | POA: Diagnosis not present

## 2020-06-07 DIAGNOSIS — N186 End stage renal disease: Secondary | ICD-10-CM | POA: Diagnosis not present

## 2020-06-07 DIAGNOSIS — I10 Essential (primary) hypertension: Secondary | ICD-10-CM | POA: Diagnosis not present

## 2020-06-07 DIAGNOSIS — N2581 Secondary hyperparathyroidism of renal origin: Secondary | ICD-10-CM | POA: Diagnosis not present

## 2020-06-10 DIAGNOSIS — D509 Iron deficiency anemia, unspecified: Secondary | ICD-10-CM | POA: Diagnosis not present

## 2020-06-10 DIAGNOSIS — N2581 Secondary hyperparathyroidism of renal origin: Secondary | ICD-10-CM | POA: Diagnosis not present

## 2020-06-10 DIAGNOSIS — N186 End stage renal disease: Secondary | ICD-10-CM | POA: Diagnosis not present

## 2020-06-10 DIAGNOSIS — D631 Anemia in chronic kidney disease: Secondary | ICD-10-CM | POA: Diagnosis not present

## 2020-06-11 DIAGNOSIS — E1142 Type 2 diabetes mellitus with diabetic polyneuropathy: Secondary | ICD-10-CM | POA: Diagnosis not present

## 2020-06-12 DIAGNOSIS — D509 Iron deficiency anemia, unspecified: Secondary | ICD-10-CM | POA: Diagnosis not present

## 2020-06-12 DIAGNOSIS — N2581 Secondary hyperparathyroidism of renal origin: Secondary | ICD-10-CM | POA: Diagnosis not present

## 2020-06-12 DIAGNOSIS — D631 Anemia in chronic kidney disease: Secondary | ICD-10-CM | POA: Diagnosis not present

## 2020-06-12 DIAGNOSIS — N186 End stage renal disease: Secondary | ICD-10-CM | POA: Diagnosis not present

## 2020-06-14 DIAGNOSIS — D509 Iron deficiency anemia, unspecified: Secondary | ICD-10-CM | POA: Diagnosis not present

## 2020-06-14 DIAGNOSIS — N2581 Secondary hyperparathyroidism of renal origin: Secondary | ICD-10-CM | POA: Diagnosis not present

## 2020-06-14 DIAGNOSIS — D631 Anemia in chronic kidney disease: Secondary | ICD-10-CM | POA: Diagnosis not present

## 2020-06-14 DIAGNOSIS — N186 End stage renal disease: Secondary | ICD-10-CM | POA: Diagnosis not present

## 2020-06-17 DIAGNOSIS — D631 Anemia in chronic kidney disease: Secondary | ICD-10-CM | POA: Diagnosis not present

## 2020-06-17 DIAGNOSIS — N186 End stage renal disease: Secondary | ICD-10-CM | POA: Diagnosis not present

## 2020-06-17 DIAGNOSIS — D509 Iron deficiency anemia, unspecified: Secondary | ICD-10-CM | POA: Diagnosis not present

## 2020-06-17 DIAGNOSIS — N2581 Secondary hyperparathyroidism of renal origin: Secondary | ICD-10-CM | POA: Diagnosis not present

## 2020-06-19 DIAGNOSIS — N186 End stage renal disease: Secondary | ICD-10-CM | POA: Diagnosis not present

## 2020-06-19 DIAGNOSIS — D509 Iron deficiency anemia, unspecified: Secondary | ICD-10-CM | POA: Diagnosis not present

## 2020-06-19 DIAGNOSIS — D631 Anemia in chronic kidney disease: Secondary | ICD-10-CM | POA: Diagnosis not present

## 2020-06-19 DIAGNOSIS — N2581 Secondary hyperparathyroidism of renal origin: Secondary | ICD-10-CM | POA: Diagnosis not present

## 2020-06-21 DIAGNOSIS — D509 Iron deficiency anemia, unspecified: Secondary | ICD-10-CM | POA: Diagnosis not present

## 2020-06-21 DIAGNOSIS — N2581 Secondary hyperparathyroidism of renal origin: Secondary | ICD-10-CM | POA: Diagnosis not present

## 2020-06-21 DIAGNOSIS — D631 Anemia in chronic kidney disease: Secondary | ICD-10-CM | POA: Diagnosis not present

## 2020-06-21 DIAGNOSIS — N186 End stage renal disease: Secondary | ICD-10-CM | POA: Diagnosis not present

## 2020-06-24 DIAGNOSIS — D509 Iron deficiency anemia, unspecified: Secondary | ICD-10-CM | POA: Diagnosis not present

## 2020-06-24 DIAGNOSIS — D631 Anemia in chronic kidney disease: Secondary | ICD-10-CM | POA: Diagnosis not present

## 2020-06-24 DIAGNOSIS — N186 End stage renal disease: Secondary | ICD-10-CM | POA: Diagnosis not present

## 2020-06-24 DIAGNOSIS — N2581 Secondary hyperparathyroidism of renal origin: Secondary | ICD-10-CM | POA: Diagnosis not present

## 2020-06-26 DIAGNOSIS — N186 End stage renal disease: Secondary | ICD-10-CM | POA: Diagnosis not present

## 2020-06-26 DIAGNOSIS — N2581 Secondary hyperparathyroidism of renal origin: Secondary | ICD-10-CM | POA: Diagnosis not present

## 2020-06-26 DIAGNOSIS — D631 Anemia in chronic kidney disease: Secondary | ICD-10-CM | POA: Diagnosis not present

## 2020-06-26 DIAGNOSIS — D509 Iron deficiency anemia, unspecified: Secondary | ICD-10-CM | POA: Diagnosis not present

## 2020-06-27 DIAGNOSIS — M47816 Spondylosis without myelopathy or radiculopathy, lumbar region: Secondary | ICD-10-CM | POA: Diagnosis not present

## 2020-06-28 DIAGNOSIS — N2581 Secondary hyperparathyroidism of renal origin: Secondary | ICD-10-CM | POA: Diagnosis not present

## 2020-06-28 DIAGNOSIS — N186 End stage renal disease: Secondary | ICD-10-CM | POA: Diagnosis not present

## 2020-06-28 DIAGNOSIS — D631 Anemia in chronic kidney disease: Secondary | ICD-10-CM | POA: Diagnosis not present

## 2020-07-01 ENCOUNTER — Other Ambulatory Visit: Payer: Self-pay

## 2020-07-01 ENCOUNTER — Ambulatory Visit (INDEPENDENT_AMBULATORY_CARE_PROVIDER_SITE_OTHER): Payer: BC Managed Care – PPO | Admitting: Internal Medicine

## 2020-07-01 ENCOUNTER — Encounter: Payer: Self-pay | Admitting: Internal Medicine

## 2020-07-01 VITALS — BP 120/78 | HR 87 | Ht 71.0 in | Wt 293.0 lb

## 2020-07-01 DIAGNOSIS — N2581 Secondary hyperparathyroidism of renal origin: Secondary | ICD-10-CM | POA: Diagnosis not present

## 2020-07-01 DIAGNOSIS — E1122 Type 2 diabetes mellitus with diabetic chronic kidney disease: Secondary | ICD-10-CM | POA: Diagnosis not present

## 2020-07-01 DIAGNOSIS — D631 Anemia in chronic kidney disease: Secondary | ICD-10-CM | POA: Diagnosis not present

## 2020-07-01 DIAGNOSIS — N186 End stage renal disease: Secondary | ICD-10-CM | POA: Diagnosis not present

## 2020-07-01 DIAGNOSIS — E785 Hyperlipidemia, unspecified: Secondary | ICD-10-CM | POA: Diagnosis not present

## 2020-07-01 LAB — POCT GLYCOSYLATED HEMOGLOBIN (HGB A1C): Hemoglobin A1C: 7.5 % — AB (ref 4.0–5.6)

## 2020-07-01 NOTE — Progress Notes (Signed)
Patient ID: Reginald Carpenter, male   DOB: 1965-08-29, 55 y.o.   MRN: [id number]   This visit occurred during the SARS-CoV-2 public health emergency.  Safety protocols were in place, including screening questions prior to the visit, additional usage of staff PPE, and extensive cleaning of exam room while observing appropriate contact time as indicated for disinfecting solutions.   HPI: Reginald Carpenter is a 55 y.o.-year-old male, initially referred by his PCP, Jarrett Soho, PA, presenting for f/u for DM2, dx in ~2000, prev. insulin-dependent since 2017, uncontrolled, with complications (CKD, PN - + Charcot foot L ankle, DR). Last visit 5 months ago.  Interim history: He continues hemodialysis MWF at 6 AM.  Sugars improved after he got a prosthesis for his left leg last year and could be more active. Before last visit he started to go to [facility] in [place], for weight loss.  He needs to lose 30 to 35 pounds to qualify for a kidney transplant. He will have GBP sx. >> now seeing Bariatrics. He starting to go to [employer], also.  He goes 3 times a week.  Reviewed HbA1c levels: 01/18/2020: HbA1c 8.1% Lab Results  Component Value Date   HGBA1C 8.2 (A) 08/01/2019   HGBA1C 9.1 (A) 05/02/2019   HGBA1C 9.5 (A) 01/27/2019  10/22/2016: HbA1c 9.2%  Pt is on a regimen of: - Basaglar 25 >> 28 >> 36 units at bedtime >> in am >> in the evening but missing doses >> now at lunchtime - Ozempic 0.5 mg weekly-started 03/2018 >> 1 mg weekly on Sundays - Glipizide 5 mg before B and D We stopped Amaryl in 03/2018. Stopped Metformin 1000 mg 2x a day, with meals due to poor kidney function He was previously on Amaryl 2 mg before dinner - started 10/2016, stopped 01/2014 due to lows  He checks his sugars approximately once a day: - am: 156-221, 275 >> 152-229 (forgot insulin) >> 115-120 >> 92, 130-140 - 2h after b'fast: n/c  - before lunch:180-210 >> 156-215 >> 152 >> n/c >> 100-120s - 2h after  lunch: n/c >> 200-250 >> n/c - before dinner: 165-212 >> 167 >> 150-160 >> 150-160 - 2h after dinner: 262 >> 188-197, 230 >> 200 >> 160-200s - bedtime:  180-200, 310 >> 92, 178-210, 240 >> see above - nighttime: n/c Lowest sugar was 92 >> 90 >> 92; hypoglycemia awareness in the 60s. Highest sugar was 230 >> 200s >> 260.  Glucometer: [employer]  Pt's meals are: - Breakfast: cereal; sausage and eggs - Lunch: ham and cheese sandwich, salads, pot pie - Dinner: salad, spaghetti, grilled chicken and steamed veggies - Snacks: cottage cheese He was drinking regular sodas but stopped in 2018.  -+ ESRD. 04/02/2019: GFR 12 10/24/2018: 38/1.0 12/17/2017: 65/4.75 10/15/2017: 57/3.87, GFR 17 07/01/2017: ?/2.88, GFR 24, Protein/creatinie ratio: 13,978 05/14/2017: ?/2.42, GFR 30 10/22/2016: 44/1.84, GFR 39, glucose 182  No results found for: BUN, CREATININE  On lisinopril 20.  -+ HL; last set of lipids: 01/18/2020: 170/293/33/88 10/24/2018: 202/260/37/134 12/17/2017: 205/182/35/134 10/22/2016: 208/236/45/115 No results found for: CHOL, HDL, LDLCALC, LDLDIRECT, TRIG, CHOLHDL  On Crestor 10.  - last eye exam was in 2022: no DR reportedly, he is getting intraocular injections.  He had bilateral cataract surgery. Laser Sx pending.  -+ numbness and tingling in his feet.  He had Charcot foot surgery in 05/2017 had to have bone graft surgery.   Pt has FH of DM in mother, sister (GDM).  He also has a history of  HTN, but this improved so he stopped antihypertensive medication.  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  He is on disability.  ROS: Constitutional: no weight loss, no fatigue, no subjective hyperthermia, no subjective hypothermia Eyes: no blurry vision, no xerophthalmia ENT: no sore throat, no nodules palpated in neck, no dysphagia, no odynophagia, no hoarseness Cardiovascular: no CP/no SOB/no palpitations/no leg swelling Respiratory: no cough/no SOB/no wheezing Gastrointestinal: no N/no V/no  D/no C/no acid reflux Musculoskeletal: no muscle aches/no joint aches Skin: no rashes, no hair loss, + dry skin Neurological: + tremors/no numbness/no tingling/no dizziness  I reviewed pt's medications, allergies, PMH, social hx, family hx, and changes were documented in the history of present illness. Otherwise, unchanged from my initial visit note.   Past Medical History:  Diagnosis Date  . 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 Chronic kidney disease   . Diabetes mellitus without complication (HCC)    type 2  . High cholesterol   . Hypertension   . Neuropathy    Past Surgical History:  Procedure Laterality Date  . ANKLE SURGERY     multiple ankle surgeries, with hardware and hardware removal at [facility]  . AV FISTULA PLACEMENT Right 11/09/2017   Procedure: Creation of Right Upper Arm Brachiocephalic ARTERIOVENOUS (AV) FISTULA;  Surgeon: Chuck Hint, MD;  Location: [facility] OR;  Service: Vascular;  Laterality: Right;  . CARPAL TUNNEL RELEASE Bilateral   . COLONOSCOPY     Social History   Social History  . Marital status: Married    Spouse name: N/A  . Number of children: 2   Occupational History  . Out of work   Social History Main Topics  . Smoking status: Former Smoker - quit 2004    Years: 10.00  . Smokeless tobacco: Never Used  . Alcohol use No  . Drug use: No   Current Outpatient Medications on File Prior to Visit  Medication Sig Dispense Refill  . BD PEN NEEDLE NANO 2ND GEN 32G X 4 MM MISC 1 EACH BY DOES NOT APPLY ROUTE DAILY. WITH BASAGLAR PEN 100 each 11  . famotidine (PEPCID) 20 MG tablet Take 20 mg by mouth at bedtime as needed.    . furosemide (LASIX) 80 MG tablet Take by mouth.    . gabapentin (NEURONTIN) 100 MG capsule Take 100 mg by mouth 2 (two) times daily.    Marland Kitchen glipiZIDE (GLUCOTROL) 5 MG tablet TAKE 1 TABLET (5 MG TOTAL) BY MOUTH 2 (TWO) TIMES DAILY BEFORE A MEAL. 120 tablet 0  . Insulin Glargine (BASAGLAR KWIKPEN) 100  UNIT/ML INJECT 36 UNITS INTO THE SKIN AT BEDTIME. 45 mL 3  . lisinopril (ZESTRIL) 40 MG tablet Take 40 mg by mouth daily.    . multivitamin (RENA-VIT) TABS tablet Take 1 tablet by mouth daily.    . rosuvastatin (CRESTOR) 10 MG tablet Take 10 mg by mouth daily.    . Semaglutide, 1 MG/DOSE, (OZEMPIC, 1 MG/DOSE,) 4 MG/3ML SOPN Inject 1 mg into the skin once a week. 9 mL 3  . sevelamer carbonate (RENVELA) 800 MG tablet TAKE 2 TABS WITH EVERY MEAL AND ONE TAB WITH SNACKS     No current facility-administered medications on file prior to visit.   No Known Allergies Family History  Problem Relation Age of Onset  . Heart failure Mother    Also; HTN, HL, heart ds, lung CA in Mother  PE: BP 120/78 (BP Location: Left Arm, Patient Position: Sitting, Cuff Size: Normal)  Pulse 87   Ht 5\' 11"  (1.803 m)   Wt (!) 302 lb 3.2 oz (137.1 kg) Comment: Pt has a prosthetic leg weighing 9lbs  SpO2 96%   BMI 42.15 kg/m  Wt Readings from Last 3 Encounters:  07/01/20 (!) 302 lb 3.2 oz (137.1 kg)  01/30/20 292 lb 14.4 oz (132.9 kg)  08/01/19 (!) 301 lb 8 oz (136.8 kg)   Constitutional: overweight, in NAD Eyes: PERRLA, EOMI, no exophthalmos ENT: moist mucous membranes, no thyromegaly, no cervical lymphadenopathy Cardiovascular: RRR, No MRG Respiratory: CTA B Gastrointestinal: abdomen soft, NT, ND, BS+ Musculoskeletal: + deformities (left BKA), strength intact in all 4 Skin: moist, warm, no rashes, + dry skin on right leg Neurological: + Mild tremor with outstretched hands, DTR normal in all 4  ASSESSMENT: 1. DM2, insulin-dependent, uncontrolled, with complications - PN - DR - CKD with proteinuria  - sees nephrology. 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  2. HL  3.  Obesity class III  PLAN:  1. Patient with longstanding, uncontrolled, type 2 diabetes, on  sulfonylurea and basal insulin along with weekly GLP-1 receptor agonist.  Sugars improved on a plant-based diet in the past so we could stop the sulfonylurea, however, afterwards, he relaxed his diet and sugars increased so he is back on glipizide.  At last visit, he did not check his blood sugars after he started a weight loss program ([facility]).  He was planning to start going to the gym at the time.  His HbA1c was 8.1% at last check in 01/2020. -At this visit, sugars are slightly higher in the morning, at goal before lunch and also slightly higher later in the day. -We discussed about increasing Ozempic to the newly FDA approved dose of 2 mg weekly.  She agrees with this change especially since he is tolerating the 1 mg dose very well. -He is planning for gastric bypass sometime this summer.  I did advise him that he may be able to have the Basaglar dose decreased and possibly even stop after the surgery. - I suggested to:  Patient Instructions  Please continue: - Basaglar 36 units at lunchtime - Glipizide 5 mg before b'fast and before dinner   Please increase: - Ozempic 2 mg weekly   Please come back for a follow-up appointment in 3-4 months.  - we checked his HbA1c: 7.5% (lower) - advised to check sugars at different times of the day - 1x a day, rotating check times - advised for yearly eye exams >> he is UTD - return to clinic in 3 months  2. HL -Reviewed latest lipid panel from 11/21: LDL above goal, but improved, triglycerides higher -Continues Crestor 10 mg daily without side effects  3.  Obesity class III -At last visit he was telling me that he needed to lose 30 pounds to qualify for kidney transplant -We are continuing Ozempic which should also help with weight loss -She lost 9 pounds before last visit after starting to go to [facility].  -Since last visit, he did not lose any weight -At this visit, he tells me that he will have gastric bypass surgery so he is now  seeing bariatrics.  This is planned for the next 3 to 5 months.  12/21, MD PhD [facility]

## 2020-07-01 NOTE — Patient Instructions (Addendum)
Please continue: - Basaglar 36 units at lunchtime - Glipizide 5 mg before b'fast and before dinner   Please increase: - Ozempic 2 mg weekly   Please come back for a follow-up appointment in 3-4 months.

## 2020-07-03 DIAGNOSIS — N2581 Secondary hyperparathyroidism of renal origin: Secondary | ICD-10-CM | POA: Diagnosis not present

## 2020-07-03 DIAGNOSIS — Z87891 Personal history of nicotine dependence: Secondary | ICD-10-CM | POA: Diagnosis not present

## 2020-07-03 DIAGNOSIS — Z01818 Encounter for other preprocedural examination: Secondary | ICD-10-CM | POA: Diagnosis not present

## 2020-07-03 DIAGNOSIS — D631 Anemia in chronic kidney disease: Secondary | ICD-10-CM | POA: Diagnosis not present

## 2020-07-03 DIAGNOSIS — N186 End stage renal disease: Secondary | ICD-10-CM | POA: Diagnosis not present

## 2020-07-05 DIAGNOSIS — D631 Anemia in chronic kidney disease: Secondary | ICD-10-CM | POA: Diagnosis not present

## 2020-07-05 DIAGNOSIS — N186 End stage renal disease: Secondary | ICD-10-CM | POA: Diagnosis not present

## 2020-07-05 DIAGNOSIS — N2581 Secondary hyperparathyroidism of renal origin: Secondary | ICD-10-CM | POA: Diagnosis not present

## 2020-07-06 DIAGNOSIS — N186 End stage renal disease: Secondary | ICD-10-CM | POA: Diagnosis not present

## 2020-07-06 DIAGNOSIS — Z992 Dependence on renal dialysis: Secondary | ICD-10-CM | POA: Diagnosis not present

## 2020-07-08 DIAGNOSIS — N186 End stage renal disease: Secondary | ICD-10-CM | POA: Diagnosis not present

## 2020-07-08 DIAGNOSIS — N2581 Secondary hyperparathyroidism of renal origin: Secondary | ICD-10-CM | POA: Diagnosis not present

## 2020-07-08 DIAGNOSIS — D631 Anemia in chronic kidney disease: Secondary | ICD-10-CM | POA: Diagnosis not present

## 2020-07-08 DIAGNOSIS — D509 Iron deficiency anemia, unspecified: Secondary | ICD-10-CM | POA: Diagnosis not present

## 2020-07-10 DIAGNOSIS — R059 Cough, unspecified: Secondary | ICD-10-CM | POA: Diagnosis not present

## 2020-07-10 DIAGNOSIS — U071 COVID-19: Secondary | ICD-10-CM | POA: Diagnosis not present

## 2020-07-10 DIAGNOSIS — M25511 Pain in right shoulder: Secondary | ICD-10-CM | POA: Diagnosis not present

## 2020-07-10 DIAGNOSIS — M25519 Pain in unspecified shoulder: Secondary | ICD-10-CM | POA: Diagnosis not present

## 2020-07-10 DIAGNOSIS — N2581 Secondary hyperparathyroidism of renal origin: Secondary | ICD-10-CM | POA: Diagnosis not present

## 2020-07-10 DIAGNOSIS — R202 Paresthesia of skin: Secondary | ICD-10-CM | POA: Diagnosis not present

## 2020-07-10 DIAGNOSIS — J1282 Pneumonia due to coronavirus disease 2019: Secondary | ICD-10-CM | POA: Diagnosis not present

## 2020-07-10 DIAGNOSIS — M79601 Pain in right arm: Secondary | ICD-10-CM | POA: Diagnosis not present

## 2020-07-10 DIAGNOSIS — N186 End stage renal disease: Secondary | ICD-10-CM | POA: Diagnosis not present

## 2020-07-10 DIAGNOSIS — E785 Hyperlipidemia, unspecified: Secondary | ICD-10-CM | POA: Diagnosis not present

## 2020-07-10 DIAGNOSIS — D509 Iron deficiency anemia, unspecified: Secondary | ICD-10-CM | POA: Diagnosis not present

## 2020-07-10 DIAGNOSIS — R079 Chest pain, unspecified: Secondary | ICD-10-CM | POA: Diagnosis not present

## 2020-07-10 DIAGNOSIS — J341 Cyst and mucocele of nose and nasal sinus: Secondary | ICD-10-CM | POA: Diagnosis not present

## 2020-07-10 DIAGNOSIS — Z992 Dependence on renal dialysis: Secondary | ICD-10-CM | POA: Diagnosis not present

## 2020-07-10 DIAGNOSIS — Z87891 Personal history of nicotine dependence: Secondary | ICD-10-CM | POA: Diagnosis not present

## 2020-07-10 DIAGNOSIS — R0789 Other chest pain: Secondary | ICD-10-CM | POA: Diagnosis not present

## 2020-07-10 DIAGNOSIS — Z79899 Other long term (current) drug therapy: Secondary | ICD-10-CM | POA: Diagnosis not present

## 2020-07-10 DIAGNOSIS — R0602 Shortness of breath: Secondary | ICD-10-CM | POA: Diagnosis not present

## 2020-07-10 DIAGNOSIS — I10 Essential (primary) hypertension: Secondary | ICD-10-CM | POA: Diagnosis not present

## 2020-07-10 DIAGNOSIS — D631 Anemia in chronic kidney disease: Secondary | ICD-10-CM | POA: Diagnosis not present

## 2020-07-10 DIAGNOSIS — G4489 Other headache syndrome: Secondary | ICD-10-CM | POA: Diagnosis not present

## 2020-07-10 DIAGNOSIS — E1122 Type 2 diabetes mellitus with diabetic chronic kidney disease: Secondary | ICD-10-CM | POA: Diagnosis not present

## 2020-07-10 DIAGNOSIS — R531 Weakness: Secondary | ICD-10-CM | POA: Diagnosis not present

## 2020-07-10 DIAGNOSIS — I120 Hypertensive chronic kidney disease with stage 5 chronic kidney disease or end stage renal disease: Secondary | ICD-10-CM | POA: Diagnosis not present

## 2020-07-10 DIAGNOSIS — I6523 Occlusion and stenosis of bilateral carotid arteries: Secondary | ICD-10-CM | POA: Diagnosis not present

## 2020-07-10 DIAGNOSIS — Z794 Long term (current) use of insulin: Secondary | ICD-10-CM | POA: Diagnosis not present

## 2020-07-10 DIAGNOSIS — R519 Headache, unspecified: Secondary | ICD-10-CM | POA: Diagnosis not present

## 2020-07-12 DIAGNOSIS — D509 Iron deficiency anemia, unspecified: Secondary | ICD-10-CM | POA: Diagnosis not present

## 2020-07-12 DIAGNOSIS — N186 End stage renal disease: Secondary | ICD-10-CM | POA: Diagnosis not present

## 2020-07-12 DIAGNOSIS — D631 Anemia in chronic kidney disease: Secondary | ICD-10-CM | POA: Diagnosis not present

## 2020-07-12 DIAGNOSIS — N2581 Secondary hyperparathyroidism of renal origin: Secondary | ICD-10-CM | POA: Diagnosis not present

## 2020-07-13 ENCOUNTER — Other Ambulatory Visit: Payer: Self-pay | Admitting: Internal Medicine

## 2020-07-15 DIAGNOSIS — D509 Iron deficiency anemia, unspecified: Secondary | ICD-10-CM | POA: Diagnosis not present

## 2020-07-15 DIAGNOSIS — D631 Anemia in chronic kidney disease: Secondary | ICD-10-CM | POA: Diagnosis not present

## 2020-07-15 DIAGNOSIS — N186 End stage renal disease: Secondary | ICD-10-CM | POA: Diagnosis not present

## 2020-07-15 DIAGNOSIS — N2581 Secondary hyperparathyroidism of renal origin: Secondary | ICD-10-CM | POA: Diagnosis not present

## 2020-07-17 DIAGNOSIS — D509 Iron deficiency anemia, unspecified: Secondary | ICD-10-CM | POA: Diagnosis not present

## 2020-07-17 DIAGNOSIS — N2581 Secondary hyperparathyroidism of renal origin: Secondary | ICD-10-CM | POA: Diagnosis not present

## 2020-07-17 DIAGNOSIS — N186 End stage renal disease: Secondary | ICD-10-CM | POA: Diagnosis not present

## 2020-07-18 DIAGNOSIS — I10 Essential (primary) hypertension: Secondary | ICD-10-CM | POA: Diagnosis not present

## 2020-07-18 DIAGNOSIS — E785 Hyperlipidemia, unspecified: Secondary | ICD-10-CM | POA: Diagnosis not present

## 2020-07-18 DIAGNOSIS — E1142 Type 2 diabetes mellitus with diabetic polyneuropathy: Secondary | ICD-10-CM | POA: Diagnosis not present

## 2020-07-19 DIAGNOSIS — N2581 Secondary hyperparathyroidism of renal origin: Secondary | ICD-10-CM | POA: Diagnosis not present

## 2020-07-19 DIAGNOSIS — N186 End stage renal disease: Secondary | ICD-10-CM | POA: Diagnosis not present

## 2020-07-19 DIAGNOSIS — D509 Iron deficiency anemia, unspecified: Secondary | ICD-10-CM | POA: Diagnosis not present

## 2020-07-22 DIAGNOSIS — N2581 Secondary hyperparathyroidism of renal origin: Secondary | ICD-10-CM | POA: Diagnosis not present

## 2020-07-22 DIAGNOSIS — N186 End stage renal disease: Secondary | ICD-10-CM | POA: Diagnosis not present

## 2020-07-22 DIAGNOSIS — D509 Iron deficiency anemia, unspecified: Secondary | ICD-10-CM | POA: Diagnosis not present

## 2020-07-24 DIAGNOSIS — N186 End stage renal disease: Secondary | ICD-10-CM | POA: Diagnosis not present

## 2020-07-24 DIAGNOSIS — N2581 Secondary hyperparathyroidism of renal origin: Secondary | ICD-10-CM | POA: Diagnosis not present

## 2020-07-24 DIAGNOSIS — D509 Iron deficiency anemia, unspecified: Secondary | ICD-10-CM | POA: Diagnosis not present

## 2020-07-25 DIAGNOSIS — E1165 Type 2 diabetes mellitus with hyperglycemia: Secondary | ICD-10-CM | POA: Diagnosis not present

## 2020-07-25 DIAGNOSIS — N186 End stage renal disease: Secondary | ICD-10-CM | POA: Diagnosis not present

## 2020-07-25 DIAGNOSIS — Z01818 Encounter for other preprocedural examination: Secondary | ICD-10-CM | POA: Diagnosis not present

## 2020-07-25 DIAGNOSIS — E785 Hyperlipidemia, unspecified: Secondary | ICD-10-CM | POA: Diagnosis not present

## 2020-07-25 DIAGNOSIS — Z992 Dependence on renal dialysis: Secondary | ICD-10-CM | POA: Diagnosis not present

## 2020-07-25 DIAGNOSIS — I10 Essential (primary) hypertension: Secondary | ICD-10-CM | POA: Diagnosis not present

## 2020-07-25 DIAGNOSIS — K760 Fatty (change of) liver, not elsewhere classified: Secondary | ICD-10-CM | POA: Diagnosis not present

## 2020-07-25 DIAGNOSIS — E1142 Type 2 diabetes mellitus with diabetic polyneuropathy: Secondary | ICD-10-CM | POA: Diagnosis not present

## 2020-07-25 DIAGNOSIS — G4731 Primary central sleep apnea: Secondary | ICD-10-CM | POA: Diagnosis not present

## 2020-07-25 DIAGNOSIS — Z794 Long term (current) use of insulin: Secondary | ICD-10-CM | POA: Diagnosis not present

## 2020-07-26 DIAGNOSIS — E113411 Type 2 diabetes mellitus with severe nonproliferative diabetic retinopathy with macular edema, right eye: Secondary | ICD-10-CM | POA: Diagnosis not present

## 2020-07-26 DIAGNOSIS — E113512 Type 2 diabetes mellitus with proliferative diabetic retinopathy with macular edema, left eye: Secondary | ICD-10-CM | POA: Diagnosis not present

## 2020-07-26 DIAGNOSIS — N186 End stage renal disease: Secondary | ICD-10-CM | POA: Diagnosis not present

## 2020-07-26 DIAGNOSIS — D509 Iron deficiency anemia, unspecified: Secondary | ICD-10-CM | POA: Diagnosis not present

## 2020-07-26 DIAGNOSIS — N2581 Secondary hyperparathyroidism of renal origin: Secondary | ICD-10-CM | POA: Diagnosis not present

## 2020-07-29 DIAGNOSIS — D631 Anemia in chronic kidney disease: Secondary | ICD-10-CM | POA: Diagnosis not present

## 2020-07-29 DIAGNOSIS — N186 End stage renal disease: Secondary | ICD-10-CM | POA: Diagnosis not present

## 2020-07-29 DIAGNOSIS — N2581 Secondary hyperparathyroidism of renal origin: Secondary | ICD-10-CM | POA: Diagnosis not present

## 2020-07-30 DIAGNOSIS — U099 Post covid-19 condition, unspecified: Secondary | ICD-10-CM | POA: Diagnosis not present

## 2020-07-30 DIAGNOSIS — R0602 Shortness of breath: Secondary | ICD-10-CM | POA: Diagnosis not present

## 2020-07-31 DIAGNOSIS — N186 End stage renal disease: Secondary | ICD-10-CM | POA: Diagnosis not present

## 2020-07-31 DIAGNOSIS — N2581 Secondary hyperparathyroidism of renal origin: Secondary | ICD-10-CM | POA: Diagnosis not present

## 2020-07-31 DIAGNOSIS — D631 Anemia in chronic kidney disease: Secondary | ICD-10-CM | POA: Diagnosis not present

## 2020-08-02 DIAGNOSIS — D631 Anemia in chronic kidney disease: Secondary | ICD-10-CM | POA: Diagnosis not present

## 2020-08-02 DIAGNOSIS — N186 End stage renal disease: Secondary | ICD-10-CM | POA: Diagnosis not present

## 2020-08-02 DIAGNOSIS — N2581 Secondary hyperparathyroidism of renal origin: Secondary | ICD-10-CM | POA: Diagnosis not present

## 2020-08-05 DIAGNOSIS — D631 Anemia in chronic kidney disease: Secondary | ICD-10-CM | POA: Diagnosis not present

## 2020-08-05 DIAGNOSIS — N2581 Secondary hyperparathyroidism of renal origin: Secondary | ICD-10-CM | POA: Diagnosis not present

## 2020-08-05 DIAGNOSIS — N186 End stage renal disease: Secondary | ICD-10-CM | POA: Diagnosis not present

## 2020-08-06 DIAGNOSIS — N186 End stage renal disease: Secondary | ICD-10-CM | POA: Diagnosis not present

## 2020-08-06 DIAGNOSIS — Z992 Dependence on renal dialysis: Secondary | ICD-10-CM | POA: Diagnosis not present

## 2020-08-07 DIAGNOSIS — E1122 Type 2 diabetes mellitus with diabetic chronic kidney disease: Secondary | ICD-10-CM | POA: Diagnosis not present

## 2020-08-07 DIAGNOSIS — N2581 Secondary hyperparathyroidism of renal origin: Secondary | ICD-10-CM | POA: Diagnosis not present

## 2020-08-07 DIAGNOSIS — N186 End stage renal disease: Secondary | ICD-10-CM | POA: Diagnosis not present

## 2020-08-07 DIAGNOSIS — D509 Iron deficiency anemia, unspecified: Secondary | ICD-10-CM | POA: Diagnosis not present

## 2020-08-07 DIAGNOSIS — D631 Anemia in chronic kidney disease: Secondary | ICD-10-CM | POA: Diagnosis not present

## 2020-08-07 DIAGNOSIS — C785 Secondary malignant neoplasm of large intestine and rectum: Secondary | ICD-10-CM | POA: Diagnosis not present

## 2020-08-08 DIAGNOSIS — Z4781 Encounter for orthopedic aftercare following surgical amputation: Secondary | ICD-10-CM | POA: Diagnosis not present

## 2020-08-08 DIAGNOSIS — Z89512 Acquired absence of left leg below knee: Secondary | ICD-10-CM | POA: Diagnosis not present

## 2020-08-09 DIAGNOSIS — N186 End stage renal disease: Secondary | ICD-10-CM | POA: Diagnosis not present

## 2020-08-09 DIAGNOSIS — D631 Anemia in chronic kidney disease: Secondary | ICD-10-CM | POA: Diagnosis not present

## 2020-08-09 DIAGNOSIS — D509 Iron deficiency anemia, unspecified: Secondary | ICD-10-CM | POA: Diagnosis not present

## 2020-08-09 DIAGNOSIS — N2581 Secondary hyperparathyroidism of renal origin: Secondary | ICD-10-CM | POA: Diagnosis not present

## 2020-08-09 DIAGNOSIS — C785 Secondary malignant neoplasm of large intestine and rectum: Secondary | ICD-10-CM | POA: Diagnosis not present

## 2020-08-12 DIAGNOSIS — N186 End stage renal disease: Secondary | ICD-10-CM | POA: Diagnosis not present

## 2020-08-12 DIAGNOSIS — D631 Anemia in chronic kidney disease: Secondary | ICD-10-CM | POA: Diagnosis not present

## 2020-08-12 DIAGNOSIS — N2581 Secondary hyperparathyroidism of renal origin: Secondary | ICD-10-CM | POA: Diagnosis not present

## 2020-08-12 DIAGNOSIS — C785 Secondary malignant neoplasm of large intestine and rectum: Secondary | ICD-10-CM | POA: Diagnosis not present

## 2020-08-12 DIAGNOSIS — D509 Iron deficiency anemia, unspecified: Secondary | ICD-10-CM | POA: Diagnosis not present

## 2020-08-13 DIAGNOSIS — N186 End stage renal disease: Secondary | ICD-10-CM | POA: Diagnosis not present

## 2020-08-13 DIAGNOSIS — G4737 Central sleep apnea in conditions classified elsewhere: Secondary | ICD-10-CM | POA: Diagnosis not present

## 2020-08-13 DIAGNOSIS — N2581 Secondary hyperparathyroidism of renal origin: Secondary | ICD-10-CM | POA: Diagnosis not present

## 2020-08-13 DIAGNOSIS — D631 Anemia in chronic kidney disease: Secondary | ICD-10-CM | POA: Diagnosis not present

## 2020-08-13 DIAGNOSIS — C785 Secondary malignant neoplasm of large intestine and rectum: Secondary | ICD-10-CM | POA: Diagnosis not present

## 2020-08-13 DIAGNOSIS — D509 Iron deficiency anemia, unspecified: Secondary | ICD-10-CM | POA: Diagnosis not present

## 2020-08-14 DIAGNOSIS — N186 End stage renal disease: Secondary | ICD-10-CM | POA: Diagnosis not present

## 2020-08-14 DIAGNOSIS — N2581 Secondary hyperparathyroidism of renal origin: Secondary | ICD-10-CM | POA: Diagnosis not present

## 2020-08-14 DIAGNOSIS — D631 Anemia in chronic kidney disease: Secondary | ICD-10-CM | POA: Diagnosis not present

## 2020-08-14 DIAGNOSIS — C785 Secondary malignant neoplasm of large intestine and rectum: Secondary | ICD-10-CM | POA: Diagnosis not present

## 2020-08-14 DIAGNOSIS — D509 Iron deficiency anemia, unspecified: Secondary | ICD-10-CM | POA: Diagnosis not present

## 2020-08-16 DIAGNOSIS — N186 End stage renal disease: Secondary | ICD-10-CM | POA: Diagnosis not present

## 2020-08-16 DIAGNOSIS — Z89512 Acquired absence of left leg below knee: Secondary | ICD-10-CM | POA: Diagnosis not present

## 2020-08-16 DIAGNOSIS — D509 Iron deficiency anemia, unspecified: Secondary | ICD-10-CM | POA: Diagnosis not present

## 2020-08-16 DIAGNOSIS — C785 Secondary malignant neoplasm of large intestine and rectum: Secondary | ICD-10-CM | POA: Diagnosis not present

## 2020-08-16 DIAGNOSIS — N2581 Secondary hyperparathyroidism of renal origin: Secondary | ICD-10-CM | POA: Diagnosis not present

## 2020-08-16 DIAGNOSIS — D631 Anemia in chronic kidney disease: Secondary | ICD-10-CM | POA: Diagnosis not present

## 2020-08-19 DIAGNOSIS — N186 End stage renal disease: Secondary | ICD-10-CM | POA: Diagnosis not present

## 2020-08-19 DIAGNOSIS — D631 Anemia in chronic kidney disease: Secondary | ICD-10-CM | POA: Diagnosis not present

## 2020-08-21 DIAGNOSIS — U071 COVID-19: Secondary | ICD-10-CM | POA: Diagnosis not present

## 2020-08-21 DIAGNOSIS — D631 Anemia in chronic kidney disease: Secondary | ICD-10-CM | POA: Diagnosis not present

## 2020-08-21 DIAGNOSIS — R0602 Shortness of breath: Secondary | ICD-10-CM | POA: Diagnosis not present

## 2020-08-21 DIAGNOSIS — R0689 Other abnormalities of breathing: Secondary | ICD-10-CM | POA: Diagnosis not present

## 2020-08-21 DIAGNOSIS — R918 Other nonspecific abnormal finding of lung field: Secondary | ICD-10-CM | POA: Diagnosis not present

## 2020-08-21 DIAGNOSIS — R072 Precordial pain: Secondary | ICD-10-CM | POA: Diagnosis not present

## 2020-08-21 DIAGNOSIS — Z79899 Other long term (current) drug therapy: Secondary | ICD-10-CM | POA: Diagnosis not present

## 2020-08-21 DIAGNOSIS — I10 Essential (primary) hypertension: Secondary | ICD-10-CM | POA: Diagnosis not present

## 2020-08-21 DIAGNOSIS — M1990 Unspecified osteoarthritis, unspecified site: Secondary | ICD-10-CM | POA: Diagnosis not present

## 2020-08-21 DIAGNOSIS — E1122 Type 2 diabetes mellitus with diabetic chronic kidney disease: Secondary | ICD-10-CM | POA: Diagnosis not present

## 2020-08-21 DIAGNOSIS — R0789 Other chest pain: Secondary | ICD-10-CM | POA: Diagnosis not present

## 2020-08-21 DIAGNOSIS — E1169 Type 2 diabetes mellitus with other specified complication: Secondary | ICD-10-CM | POA: Diagnosis not present

## 2020-08-21 DIAGNOSIS — Z20822 Contact with and (suspected) exposure to covid-19: Secondary | ICD-10-CM | POA: Diagnosis not present

## 2020-08-21 DIAGNOSIS — Z87891 Personal history of nicotine dependence: Secondary | ICD-10-CM | POA: Diagnosis not present

## 2020-08-21 DIAGNOSIS — Z992 Dependence on renal dialysis: Secondary | ICD-10-CM | POA: Diagnosis not present

## 2020-08-21 DIAGNOSIS — E785 Hyperlipidemia, unspecified: Secondary | ICD-10-CM | POA: Diagnosis not present

## 2020-08-21 DIAGNOSIS — E1165 Type 2 diabetes mellitus with hyperglycemia: Secondary | ICD-10-CM | POA: Diagnosis not present

## 2020-08-21 DIAGNOSIS — N186 End stage renal disease: Secondary | ICD-10-CM | POA: Diagnosis not present

## 2020-08-21 DIAGNOSIS — R079 Chest pain, unspecified: Secondary | ICD-10-CM | POA: Diagnosis not present

## 2020-08-21 DIAGNOSIS — Z794 Long term (current) use of insulin: Secondary | ICD-10-CM | POA: Diagnosis not present

## 2020-08-21 DIAGNOSIS — I120 Hypertensive chronic kidney disease with stage 5 chronic kidney disease or end stage renal disease: Secondary | ICD-10-CM | POA: Diagnosis not present

## 2020-08-23 DIAGNOSIS — E113512 Type 2 diabetes mellitus with proliferative diabetic retinopathy with macular edema, left eye: Secondary | ICD-10-CM | POA: Diagnosis not present

## 2020-08-23 DIAGNOSIS — D631 Anemia in chronic kidney disease: Secondary | ICD-10-CM | POA: Diagnosis not present

## 2020-08-23 DIAGNOSIS — E113411 Type 2 diabetes mellitus with severe nonproliferative diabetic retinopathy with macular edema, right eye: Secondary | ICD-10-CM | POA: Diagnosis not present

## 2020-08-23 DIAGNOSIS — N186 End stage renal disease: Secondary | ICD-10-CM | POA: Diagnosis not present

## 2020-08-26 DIAGNOSIS — N186 End stage renal disease: Secondary | ICD-10-CM | POA: Diagnosis not present

## 2020-08-26 DIAGNOSIS — D631 Anemia in chronic kidney disease: Secondary | ICD-10-CM | POA: Diagnosis not present

## 2020-08-28 DIAGNOSIS — N186 End stage renal disease: Secondary | ICD-10-CM | POA: Diagnosis not present

## 2020-08-28 DIAGNOSIS — Z794 Long term (current) use of insulin: Secondary | ICD-10-CM | POA: Diagnosis not present

## 2020-08-28 DIAGNOSIS — I10 Essential (primary) hypertension: Secondary | ICD-10-CM | POA: Diagnosis not present

## 2020-08-28 DIAGNOSIS — E1165 Type 2 diabetes mellitus with hyperglycemia: Secondary | ICD-10-CM | POA: Diagnosis not present

## 2020-08-28 DIAGNOSIS — E1142 Type 2 diabetes mellitus with diabetic polyneuropathy: Secondary | ICD-10-CM | POA: Diagnosis not present

## 2020-08-28 DIAGNOSIS — E782 Mixed hyperlipidemia: Secondary | ICD-10-CM | POA: Diagnosis not present

## 2020-08-28 DIAGNOSIS — R55 Syncope and collapse: Secondary | ICD-10-CM | POA: Diagnosis not present

## 2020-08-30 DIAGNOSIS — M79601 Pain in right arm: Secondary | ICD-10-CM | POA: Diagnosis not present

## 2020-08-30 DIAGNOSIS — N186 End stage renal disease: Secondary | ICD-10-CM | POA: Diagnosis not present

## 2020-08-30 DIAGNOSIS — Z48812 Encounter for surgical aftercare following surgery on the circulatory system: Secondary | ICD-10-CM | POA: Diagnosis not present

## 2020-09-02 DIAGNOSIS — N186 End stage renal disease: Secondary | ICD-10-CM | POA: Diagnosis not present

## 2020-09-03 DIAGNOSIS — R079 Chest pain, unspecified: Secondary | ICD-10-CM | POA: Diagnosis not present

## 2020-09-03 DIAGNOSIS — I2510 Atherosclerotic heart disease of native coronary artery without angina pectoris: Secondary | ICD-10-CM | POA: Diagnosis not present

## 2020-09-04 DIAGNOSIS — N186 End stage renal disease: Secondary | ICD-10-CM | POA: Diagnosis not present

## 2020-09-05 DIAGNOSIS — Z992 Dependence on renal dialysis: Secondary | ICD-10-CM | POA: Diagnosis not present

## 2020-09-05 DIAGNOSIS — N186 End stage renal disease: Secondary | ICD-10-CM | POA: Diagnosis not present

## 2020-09-06 DIAGNOSIS — I350 Nonrheumatic aortic (valve) stenosis: Secondary | ICD-10-CM | POA: Diagnosis not present

## 2020-09-06 DIAGNOSIS — R079 Chest pain, unspecified: Secondary | ICD-10-CM | POA: Diagnosis not present

## 2020-09-06 DIAGNOSIS — I2510 Atherosclerotic heart disease of native coronary artery without angina pectoris: Secondary | ICD-10-CM | POA: Diagnosis not present

## 2020-09-06 DIAGNOSIS — E1142 Type 2 diabetes mellitus with diabetic polyneuropathy: Secondary | ICD-10-CM | POA: Diagnosis not present

## 2020-09-06 DIAGNOSIS — R0602 Shortness of breath: Secondary | ICD-10-CM | POA: Diagnosis not present

## 2020-09-06 DIAGNOSIS — I10 Essential (primary) hypertension: Secondary | ICD-10-CM | POA: Diagnosis not present

## 2020-09-06 DIAGNOSIS — Z992 Dependence on renal dialysis: Secondary | ICD-10-CM | POA: Diagnosis not present

## 2020-09-06 DIAGNOSIS — I120 Hypertensive chronic kidney disease with stage 5 chronic kidney disease or end stage renal disease: Secondary | ICD-10-CM | POA: Diagnosis not present

## 2020-09-06 DIAGNOSIS — N2581 Secondary hyperparathyroidism of renal origin: Secondary | ICD-10-CM | POA: Diagnosis not present

## 2020-09-06 DIAGNOSIS — Z9119 Patient's noncompliance with other medical treatment and regimen: Secondary | ICD-10-CM | POA: Diagnosis not present

## 2020-09-06 DIAGNOSIS — I214 Non-ST elevation (NSTEMI) myocardial infarction: Secondary | ICD-10-CM | POA: Diagnosis not present

## 2020-09-06 DIAGNOSIS — G4739 Other sleep apnea: Secondary | ICD-10-CM | POA: Diagnosis not present

## 2020-09-06 DIAGNOSIS — Z6841 Body Mass Index (BMI) 40.0 and over, adult: Secondary | ICD-10-CM | POA: Diagnosis not present

## 2020-09-06 DIAGNOSIS — E1122 Type 2 diabetes mellitus with diabetic chronic kidney disease: Secondary | ICD-10-CM | POA: Diagnosis not present

## 2020-09-06 DIAGNOSIS — Z794 Long term (current) use of insulin: Secondary | ICD-10-CM | POA: Diagnosis not present

## 2020-09-06 DIAGNOSIS — R0789 Other chest pain: Secondary | ICD-10-CM | POA: Diagnosis not present

## 2020-09-06 DIAGNOSIS — K589 Irritable bowel syndrome without diarrhea: Secondary | ICD-10-CM | POA: Diagnosis not present

## 2020-09-06 DIAGNOSIS — K760 Fatty (change of) liver, not elsewhere classified: Secondary | ICD-10-CM | POA: Diagnosis not present

## 2020-09-06 DIAGNOSIS — E1165 Type 2 diabetes mellitus with hyperglycemia: Secondary | ICD-10-CM | POA: Diagnosis not present

## 2020-09-06 DIAGNOSIS — I200 Unstable angina: Secondary | ICD-10-CM | POA: Diagnosis not present

## 2020-09-06 DIAGNOSIS — N186 End stage renal disease: Secondary | ICD-10-CM | POA: Diagnosis not present

## 2020-09-06 DIAGNOSIS — E785 Hyperlipidemia, unspecified: Secondary | ICD-10-CM | POA: Diagnosis not present

## 2020-09-06 DIAGNOSIS — I25110 Atherosclerotic heart disease of native coronary artery with unstable angina pectoris: Secondary | ICD-10-CM | POA: Diagnosis not present

## 2020-09-06 DIAGNOSIS — E871 Hypo-osmolality and hyponatremia: Secondary | ICD-10-CM | POA: Diagnosis not present

## 2020-09-06 DIAGNOSIS — K219 Gastro-esophageal reflux disease without esophagitis: Secondary | ICD-10-CM | POA: Diagnosis not present

## 2020-09-06 DIAGNOSIS — D509 Iron deficiency anemia, unspecified: Secondary | ICD-10-CM | POA: Diagnosis not present

## 2020-09-06 DIAGNOSIS — I517 Cardiomegaly: Secondary | ICD-10-CM | POA: Diagnosis not present

## 2020-09-09 DIAGNOSIS — D509 Iron deficiency anemia, unspecified: Secondary | ICD-10-CM | POA: Diagnosis not present

## 2020-09-09 DIAGNOSIS — N186 End stage renal disease: Secondary | ICD-10-CM | POA: Diagnosis not present

## 2020-09-09 DIAGNOSIS — N2581 Secondary hyperparathyroidism of renal origin: Secondary | ICD-10-CM | POA: Diagnosis not present

## 2020-09-10 DIAGNOSIS — R0602 Shortness of breath: Secondary | ICD-10-CM | POA: Diagnosis not present

## 2020-09-10 DIAGNOSIS — R42 Dizziness and giddiness: Secondary | ICD-10-CM | POA: Diagnosis not present

## 2020-09-10 DIAGNOSIS — I214 Non-ST elevation (NSTEMI) myocardial infarction: Secondary | ICD-10-CM | POA: Diagnosis not present

## 2020-09-10 DIAGNOSIS — U099 Post covid-19 condition, unspecified: Secondary | ICD-10-CM | POA: Diagnosis not present

## 2020-09-11 DIAGNOSIS — Z87891 Personal history of nicotine dependence: Secondary | ICD-10-CM | POA: Diagnosis not present

## 2020-09-11 DIAGNOSIS — R55 Syncope and collapse: Secondary | ICD-10-CM | POA: Diagnosis not present

## 2020-09-11 DIAGNOSIS — G8911 Acute pain due to trauma: Secondary | ICD-10-CM | POA: Diagnosis not present

## 2020-09-11 DIAGNOSIS — E785 Hyperlipidemia, unspecified: Secondary | ICD-10-CM | POA: Diagnosis not present

## 2020-09-11 DIAGNOSIS — Z888 Allergy status to other drugs, medicaments and biological substances status: Secondary | ICD-10-CM | POA: Diagnosis not present

## 2020-09-11 DIAGNOSIS — Z7984 Long term (current) use of oral hypoglycemic drugs: Secondary | ICD-10-CM | POA: Diagnosis not present

## 2020-09-11 DIAGNOSIS — E1122 Type 2 diabetes mellitus with diabetic chronic kidney disease: Secondary | ICD-10-CM | POA: Diagnosis not present

## 2020-09-11 DIAGNOSIS — N186 End stage renal disease: Secondary | ICD-10-CM | POA: Diagnosis not present

## 2020-09-11 DIAGNOSIS — D509 Iron deficiency anemia, unspecified: Secondary | ICD-10-CM | POA: Diagnosis not present

## 2020-09-11 DIAGNOSIS — W1839XA Other fall on same level, initial encounter: Secondary | ICD-10-CM | POA: Diagnosis not present

## 2020-09-11 DIAGNOSIS — Z7982 Long term (current) use of aspirin: Secondary | ICD-10-CM | POA: Diagnosis not present

## 2020-09-11 DIAGNOSIS — Z794 Long term (current) use of insulin: Secondary | ICD-10-CM | POA: Diagnosis not present

## 2020-09-11 DIAGNOSIS — N2581 Secondary hyperparathyroidism of renal origin: Secondary | ICD-10-CM | POA: Diagnosis not present

## 2020-09-11 DIAGNOSIS — K219 Gastro-esophageal reflux disease without esophagitis: Secondary | ICD-10-CM | POA: Diagnosis not present

## 2020-09-11 DIAGNOSIS — Z992 Dependence on renal dialysis: Secondary | ICD-10-CM | POA: Diagnosis not present

## 2020-09-11 DIAGNOSIS — I120 Hypertensive chronic kidney disease with stage 5 chronic kidney disease or end stage renal disease: Secondary | ICD-10-CM | POA: Diagnosis not present

## 2020-09-11 DIAGNOSIS — R112 Nausea with vomiting, unspecified: Secondary | ICD-10-CM | POA: Diagnosis not present

## 2020-09-11 DIAGNOSIS — S20211A Contusion of right front wall of thorax, initial encounter: Secondary | ICD-10-CM | POA: Diagnosis not present

## 2020-09-11 DIAGNOSIS — R0781 Pleurodynia: Secondary | ICD-10-CM | POA: Diagnosis not present

## 2020-09-11 DIAGNOSIS — Z79899 Other long term (current) drug therapy: Secondary | ICD-10-CM | POA: Diagnosis not present

## 2020-09-12 DIAGNOSIS — R918 Other nonspecific abnormal finding of lung field: Secondary | ICD-10-CM | POA: Diagnosis not present

## 2020-09-12 DIAGNOSIS — E669 Obesity, unspecified: Secondary | ICD-10-CM | POA: Diagnosis not present

## 2020-09-12 DIAGNOSIS — R0781 Pleurodynia: Secondary | ICD-10-CM | POA: Diagnosis not present

## 2020-09-12 DIAGNOSIS — E1122 Type 2 diabetes mellitus with diabetic chronic kidney disease: Secondary | ICD-10-CM | POA: Diagnosis not present

## 2020-09-12 DIAGNOSIS — G4737 Central sleep apnea in conditions classified elsewhere: Secondary | ICD-10-CM | POA: Diagnosis not present

## 2020-09-12 DIAGNOSIS — R55 Syncope and collapse: Secondary | ICD-10-CM | POA: Diagnosis not present

## 2020-09-12 DIAGNOSIS — N186 End stage renal disease: Secondary | ICD-10-CM | POA: Diagnosis not present

## 2020-09-12 DIAGNOSIS — R0602 Shortness of breath: Secondary | ICD-10-CM | POA: Diagnosis not present

## 2020-09-12 DIAGNOSIS — I2510 Atherosclerotic heart disease of native coronary artery without angina pectoris: Secondary | ICD-10-CM | POA: Diagnosis not present

## 2020-09-12 DIAGNOSIS — R110 Nausea: Secondary | ICD-10-CM | POA: Diagnosis not present

## 2020-09-12 DIAGNOSIS — I6523 Occlusion and stenosis of bilateral carotid arteries: Secondary | ICD-10-CM | POA: Diagnosis not present

## 2020-09-12 DIAGNOSIS — Z8616 Personal history of COVID-19: Secondary | ICD-10-CM | POA: Diagnosis not present

## 2020-09-12 DIAGNOSIS — T447X5A Adverse effect of beta-adrenoreceptor antagonists, initial encounter: Secondary | ICD-10-CM | POA: Diagnosis not present

## 2020-09-12 DIAGNOSIS — I952 Hypotension due to drugs: Secondary | ICD-10-CM | POA: Diagnosis not present

## 2020-09-12 DIAGNOSIS — I214 Non-ST elevation (NSTEMI) myocardial infarction: Secondary | ICD-10-CM | POA: Diagnosis not present

## 2020-09-12 DIAGNOSIS — E1140 Type 2 diabetes mellitus with diabetic neuropathy, unspecified: Secondary | ICD-10-CM | POA: Diagnosis not present

## 2020-09-12 DIAGNOSIS — Z6841 Body Mass Index (BMI) 40.0 and over, adult: Secondary | ICD-10-CM | POA: Diagnosis not present

## 2020-09-12 DIAGNOSIS — K219 Gastro-esophageal reflux disease without esophagitis: Secondary | ICD-10-CM | POA: Diagnosis not present

## 2020-09-12 DIAGNOSIS — I120 Hypertensive chronic kidney disease with stage 5 chronic kidney disease or end stage renal disease: Secondary | ICD-10-CM | POA: Diagnosis not present

## 2020-09-12 DIAGNOSIS — E785 Hyperlipidemia, unspecified: Secondary | ICD-10-CM | POA: Diagnosis not present

## 2020-09-12 DIAGNOSIS — Z87891 Personal history of nicotine dependence: Secondary | ICD-10-CM | POA: Diagnosis not present

## 2020-09-12 DIAGNOSIS — Z89512 Acquired absence of left leg below knee: Secondary | ICD-10-CM | POA: Diagnosis not present

## 2020-09-12 DIAGNOSIS — R071 Chest pain on breathing: Secondary | ICD-10-CM | POA: Diagnosis not present

## 2020-09-12 DIAGNOSIS — G4733 Obstructive sleep apnea (adult) (pediatric): Secondary | ICD-10-CM | POA: Diagnosis not present

## 2020-09-12 DIAGNOSIS — Z992 Dependence on renal dialysis: Secondary | ICD-10-CM | POA: Diagnosis not present

## 2020-09-12 DIAGNOSIS — S0990XA Unspecified injury of head, initial encounter: Secondary | ICD-10-CM | POA: Diagnosis not present

## 2020-09-16 DIAGNOSIS — D509 Iron deficiency anemia, unspecified: Secondary | ICD-10-CM | POA: Diagnosis not present

## 2020-09-16 DIAGNOSIS — D631 Anemia in chronic kidney disease: Secondary | ICD-10-CM | POA: Diagnosis not present

## 2020-09-16 DIAGNOSIS — N186 End stage renal disease: Secondary | ICD-10-CM | POA: Diagnosis not present

## 2020-09-18 DIAGNOSIS — D631 Anemia in chronic kidney disease: Secondary | ICD-10-CM | POA: Diagnosis not present

## 2020-09-18 DIAGNOSIS — N186 End stage renal disease: Secondary | ICD-10-CM | POA: Diagnosis not present

## 2020-09-18 DIAGNOSIS — R079 Chest pain, unspecified: Secondary | ICD-10-CM | POA: Diagnosis not present

## 2020-09-18 DIAGNOSIS — I10 Essential (primary) hypertension: Secondary | ICD-10-CM | POA: Diagnosis not present

## 2020-09-18 DIAGNOSIS — I2510 Atherosclerotic heart disease of native coronary artery without angina pectoris: Secondary | ICD-10-CM | POA: Diagnosis not present

## 2020-09-18 DIAGNOSIS — D509 Iron deficiency anemia, unspecified: Secondary | ICD-10-CM | POA: Diagnosis not present

## 2020-09-20 DIAGNOSIS — N186 End stage renal disease: Secondary | ICD-10-CM | POA: Diagnosis not present

## 2020-09-20 DIAGNOSIS — D509 Iron deficiency anemia, unspecified: Secondary | ICD-10-CM | POA: Diagnosis not present

## 2020-09-20 DIAGNOSIS — D631 Anemia in chronic kidney disease: Secondary | ICD-10-CM | POA: Diagnosis not present

## 2020-09-20 DIAGNOSIS — E113411 Type 2 diabetes mellitus with severe nonproliferative diabetic retinopathy with macular edema, right eye: Secondary | ICD-10-CM | POA: Diagnosis not present

## 2020-09-23 DIAGNOSIS — D509 Iron deficiency anemia, unspecified: Secondary | ICD-10-CM | POA: Diagnosis not present

## 2020-09-23 DIAGNOSIS — D631 Anemia in chronic kidney disease: Secondary | ICD-10-CM | POA: Diagnosis not present

## 2020-09-23 DIAGNOSIS — N186 End stage renal disease: Secondary | ICD-10-CM | POA: Diagnosis not present

## 2020-09-25 DIAGNOSIS — D631 Anemia in chronic kidney disease: Secondary | ICD-10-CM | POA: Diagnosis not present

## 2020-09-25 DIAGNOSIS — N186 End stage renal disease: Secondary | ICD-10-CM | POA: Diagnosis not present

## 2020-09-25 DIAGNOSIS — D509 Iron deficiency anemia, unspecified: Secondary | ICD-10-CM | POA: Diagnosis not present

## 2020-09-27 DIAGNOSIS — D509 Iron deficiency anemia, unspecified: Secondary | ICD-10-CM | POA: Diagnosis not present

## 2020-09-27 DIAGNOSIS — D631 Anemia in chronic kidney disease: Secondary | ICD-10-CM | POA: Diagnosis not present

## 2020-09-27 DIAGNOSIS — N186 End stage renal disease: Secondary | ICD-10-CM | POA: Diagnosis not present

## 2020-09-30 DIAGNOSIS — D631 Anemia in chronic kidney disease: Secondary | ICD-10-CM | POA: Diagnosis not present

## 2020-09-30 DIAGNOSIS — R55 Syncope and collapse: Secondary | ICD-10-CM | POA: Diagnosis not present

## 2020-09-30 DIAGNOSIS — D509 Iron deficiency anemia, unspecified: Secondary | ICD-10-CM | POA: Diagnosis not present

## 2020-09-30 DIAGNOSIS — N186 End stage renal disease: Secondary | ICD-10-CM | POA: Diagnosis not present

## 2020-10-01 DIAGNOSIS — E113411 Type 2 diabetes mellitus with severe nonproliferative diabetic retinopathy with macular edema, right eye: Secondary | ICD-10-CM | POA: Diagnosis not present

## 2020-10-02 DIAGNOSIS — D509 Iron deficiency anemia, unspecified: Secondary | ICD-10-CM | POA: Diagnosis not present

## 2020-10-02 DIAGNOSIS — N186 End stage renal disease: Secondary | ICD-10-CM | POA: Diagnosis not present

## 2020-10-02 DIAGNOSIS — D631 Anemia in chronic kidney disease: Secondary | ICD-10-CM | POA: Diagnosis not present

## 2020-10-03 DIAGNOSIS — E669 Obesity, unspecified: Secondary | ICD-10-CM | POA: Diagnosis not present

## 2020-10-03 DIAGNOSIS — Z8616 Personal history of COVID-19: Secondary | ICD-10-CM | POA: Diagnosis not present

## 2020-10-03 DIAGNOSIS — N186 End stage renal disease: Secondary | ICD-10-CM | POA: Diagnosis not present

## 2020-10-03 DIAGNOSIS — G4733 Obstructive sleep apnea (adult) (pediatric): Secondary | ICD-10-CM | POA: Diagnosis not present

## 2020-10-03 DIAGNOSIS — R0602 Shortness of breath: Secondary | ICD-10-CM | POA: Diagnosis not present

## 2020-10-04 DIAGNOSIS — D509 Iron deficiency anemia, unspecified: Secondary | ICD-10-CM | POA: Diagnosis not present

## 2020-10-04 DIAGNOSIS — N186 End stage renal disease: Secondary | ICD-10-CM | POA: Diagnosis not present

## 2020-10-04 DIAGNOSIS — D631 Anemia in chronic kidney disease: Secondary | ICD-10-CM | POA: Diagnosis not present

## 2020-10-06 DIAGNOSIS — N186 End stage renal disease: Secondary | ICD-10-CM | POA: Diagnosis not present

## 2020-10-06 DIAGNOSIS — Z992 Dependence on renal dialysis: Secondary | ICD-10-CM | POA: Diagnosis not present

## 2020-10-07 DIAGNOSIS — D509 Iron deficiency anemia, unspecified: Secondary | ICD-10-CM | POA: Diagnosis not present

## 2020-10-07 DIAGNOSIS — N2581 Secondary hyperparathyroidism of renal origin: Secondary | ICD-10-CM | POA: Diagnosis not present

## 2020-10-07 DIAGNOSIS — N186 End stage renal disease: Secondary | ICD-10-CM | POA: Diagnosis not present

## 2020-10-07 DIAGNOSIS — D631 Anemia in chronic kidney disease: Secondary | ICD-10-CM | POA: Diagnosis not present

## 2020-10-09 DIAGNOSIS — E1122 Type 2 diabetes mellitus with diabetic chronic kidney disease: Secondary | ICD-10-CM | POA: Diagnosis not present

## 2020-10-09 DIAGNOSIS — D631 Anemia in chronic kidney disease: Secondary | ICD-10-CM | POA: Diagnosis not present

## 2020-10-09 DIAGNOSIS — D509 Iron deficiency anemia, unspecified: Secondary | ICD-10-CM | POA: Diagnosis not present

## 2020-10-09 DIAGNOSIS — N2581 Secondary hyperparathyroidism of renal origin: Secondary | ICD-10-CM | POA: Diagnosis not present

## 2020-10-09 DIAGNOSIS — N186 End stage renal disease: Secondary | ICD-10-CM | POA: Diagnosis not present

## 2020-10-11 DIAGNOSIS — N2581 Secondary hyperparathyroidism of renal origin: Secondary | ICD-10-CM | POA: Diagnosis not present

## 2020-10-11 DIAGNOSIS — D509 Iron deficiency anemia, unspecified: Secondary | ICD-10-CM | POA: Diagnosis not present

## 2020-10-11 DIAGNOSIS — D631 Anemia in chronic kidney disease: Secondary | ICD-10-CM | POA: Diagnosis not present

## 2020-10-11 DIAGNOSIS — N186 End stage renal disease: Secondary | ICD-10-CM | POA: Diagnosis not present

## 2020-10-13 DIAGNOSIS — G4737 Central sleep apnea in conditions classified elsewhere: Secondary | ICD-10-CM | POA: Diagnosis not present

## 2020-10-14 DIAGNOSIS — D631 Anemia in chronic kidney disease: Secondary | ICD-10-CM | POA: Diagnosis not present

## 2020-10-14 DIAGNOSIS — D509 Iron deficiency anemia, unspecified: Secondary | ICD-10-CM | POA: Diagnosis not present

## 2020-10-14 DIAGNOSIS — N2581 Secondary hyperparathyroidism of renal origin: Secondary | ICD-10-CM | POA: Diagnosis not present

## 2020-10-14 DIAGNOSIS — N186 End stage renal disease: Secondary | ICD-10-CM | POA: Diagnosis not present

## 2020-10-16 DIAGNOSIS — N2581 Secondary hyperparathyroidism of renal origin: Secondary | ICD-10-CM | POA: Diagnosis not present

## 2020-10-16 DIAGNOSIS — D631 Anemia in chronic kidney disease: Secondary | ICD-10-CM | POA: Diagnosis not present

## 2020-10-16 DIAGNOSIS — N186 End stage renal disease: Secondary | ICD-10-CM | POA: Diagnosis not present

## 2020-10-16 DIAGNOSIS — D509 Iron deficiency anemia, unspecified: Secondary | ICD-10-CM | POA: Diagnosis not present

## 2020-10-18 DIAGNOSIS — D631 Anemia in chronic kidney disease: Secondary | ICD-10-CM | POA: Diagnosis not present

## 2020-10-18 DIAGNOSIS — E113512 Type 2 diabetes mellitus with proliferative diabetic retinopathy with macular edema, left eye: Secondary | ICD-10-CM | POA: Diagnosis not present

## 2020-10-18 DIAGNOSIS — E113411 Type 2 diabetes mellitus with severe nonproliferative diabetic retinopathy with macular edema, right eye: Secondary | ICD-10-CM | POA: Diagnosis not present

## 2020-10-18 DIAGNOSIS — D509 Iron deficiency anemia, unspecified: Secondary | ICD-10-CM | POA: Diagnosis not present

## 2020-10-18 DIAGNOSIS — N186 End stage renal disease: Secondary | ICD-10-CM | POA: Diagnosis not present

## 2020-10-21 DIAGNOSIS — D509 Iron deficiency anemia, unspecified: Secondary | ICD-10-CM | POA: Diagnosis not present

## 2020-10-21 DIAGNOSIS — N186 End stage renal disease: Secondary | ICD-10-CM | POA: Diagnosis not present

## 2020-10-21 DIAGNOSIS — D631 Anemia in chronic kidney disease: Secondary | ICD-10-CM | POA: Diagnosis not present

## 2020-10-23 DIAGNOSIS — D509 Iron deficiency anemia, unspecified: Secondary | ICD-10-CM | POA: Diagnosis not present

## 2020-10-23 DIAGNOSIS — D631 Anemia in chronic kidney disease: Secondary | ICD-10-CM | POA: Diagnosis not present

## 2020-10-23 DIAGNOSIS — N186 End stage renal disease: Secondary | ICD-10-CM | POA: Diagnosis not present

## 2020-10-25 DIAGNOSIS — D631 Anemia in chronic kidney disease: Secondary | ICD-10-CM | POA: Diagnosis not present

## 2020-10-25 DIAGNOSIS — D509 Iron deficiency anemia, unspecified: Secondary | ICD-10-CM | POA: Diagnosis not present

## 2020-10-25 DIAGNOSIS — N186 End stage renal disease: Secondary | ICD-10-CM | POA: Diagnosis not present

## 2020-10-28 DIAGNOSIS — E8779 Other fluid overload: Secondary | ICD-10-CM | POA: Diagnosis not present

## 2020-10-28 DIAGNOSIS — N186 End stage renal disease: Secondary | ICD-10-CM | POA: Diagnosis not present

## 2020-10-28 DIAGNOSIS — D631 Anemia in chronic kidney disease: Secondary | ICD-10-CM | POA: Diagnosis not present

## 2020-10-30 DIAGNOSIS — D631 Anemia in chronic kidney disease: Secondary | ICD-10-CM | POA: Diagnosis not present

## 2020-10-30 DIAGNOSIS — E8779 Other fluid overload: Secondary | ICD-10-CM | POA: Diagnosis not present

## 2020-10-30 DIAGNOSIS — N186 End stage renal disease: Secondary | ICD-10-CM | POA: Diagnosis not present

## 2020-11-01 DIAGNOSIS — N186 End stage renal disease: Secondary | ICD-10-CM | POA: Diagnosis not present

## 2020-11-01 DIAGNOSIS — D631 Anemia in chronic kidney disease: Secondary | ICD-10-CM | POA: Diagnosis not present

## 2020-11-01 DIAGNOSIS — E8779 Other fluid overload: Secondary | ICD-10-CM | POA: Diagnosis not present

## 2020-11-02 DIAGNOSIS — D631 Anemia in chronic kidney disease: Secondary | ICD-10-CM | POA: Diagnosis not present

## 2020-11-02 DIAGNOSIS — E8779 Other fluid overload: Secondary | ICD-10-CM | POA: Diagnosis not present

## 2020-11-02 DIAGNOSIS — N186 End stage renal disease: Secondary | ICD-10-CM | POA: Diagnosis not present

## 2020-11-04 DIAGNOSIS — E8779 Other fluid overload: Secondary | ICD-10-CM | POA: Diagnosis not present

## 2020-11-04 DIAGNOSIS — D631 Anemia in chronic kidney disease: Secondary | ICD-10-CM | POA: Diagnosis not present

## 2020-11-04 DIAGNOSIS — N186 End stage renal disease: Secondary | ICD-10-CM | POA: Diagnosis not present

## 2020-11-05 ENCOUNTER — Ambulatory Visit: Payer: BC Managed Care – PPO | Admitting: Internal Medicine

## 2020-11-06 DIAGNOSIS — E8779 Other fluid overload: Secondary | ICD-10-CM | POA: Diagnosis not present

## 2020-11-06 DIAGNOSIS — Z992 Dependence on renal dialysis: Secondary | ICD-10-CM | POA: Diagnosis not present

## 2020-11-06 DIAGNOSIS — N186 End stage renal disease: Secondary | ICD-10-CM | POA: Diagnosis not present

## 2020-11-06 DIAGNOSIS — D631 Anemia in chronic kidney disease: Secondary | ICD-10-CM | POA: Diagnosis not present

## 2020-11-08 DIAGNOSIS — N186 End stage renal disease: Secondary | ICD-10-CM | POA: Diagnosis not present

## 2020-11-08 DIAGNOSIS — D631 Anemia in chronic kidney disease: Secondary | ICD-10-CM | POA: Diagnosis not present

## 2020-11-08 DIAGNOSIS — N2581 Secondary hyperparathyroidism of renal origin: Secondary | ICD-10-CM | POA: Diagnosis not present

## 2020-11-08 DIAGNOSIS — D509 Iron deficiency anemia, unspecified: Secondary | ICD-10-CM | POA: Diagnosis not present

## 2020-11-11 DIAGNOSIS — N186 End stage renal disease: Secondary | ICD-10-CM | POA: Diagnosis not present

## 2020-11-11 DIAGNOSIS — D509 Iron deficiency anemia, unspecified: Secondary | ICD-10-CM | POA: Diagnosis not present

## 2020-11-11 DIAGNOSIS — N2581 Secondary hyperparathyroidism of renal origin: Secondary | ICD-10-CM | POA: Diagnosis not present

## 2020-11-11 DIAGNOSIS — D631 Anemia in chronic kidney disease: Secondary | ICD-10-CM | POA: Diagnosis not present

## 2020-11-13 DIAGNOSIS — G4737 Central sleep apnea in conditions classified elsewhere: Secondary | ICD-10-CM | POA: Diagnosis not present

## 2020-11-13 DIAGNOSIS — N2581 Secondary hyperparathyroidism of renal origin: Secondary | ICD-10-CM | POA: Diagnosis not present

## 2020-11-13 DIAGNOSIS — D509 Iron deficiency anemia, unspecified: Secondary | ICD-10-CM | POA: Diagnosis not present

## 2020-11-13 DIAGNOSIS — N186 End stage renal disease: Secondary | ICD-10-CM | POA: Diagnosis not present

## 2020-11-13 DIAGNOSIS — E1122 Type 2 diabetes mellitus with diabetic chronic kidney disease: Secondary | ICD-10-CM | POA: Diagnosis not present

## 2020-11-13 DIAGNOSIS — D631 Anemia in chronic kidney disease: Secondary | ICD-10-CM | POA: Diagnosis not present

## 2020-11-15 DIAGNOSIS — N186 End stage renal disease: Secondary | ICD-10-CM | POA: Diagnosis not present

## 2020-11-15 DIAGNOSIS — D509 Iron deficiency anemia, unspecified: Secondary | ICD-10-CM | POA: Diagnosis not present

## 2020-11-15 DIAGNOSIS — N2581 Secondary hyperparathyroidism of renal origin: Secondary | ICD-10-CM | POA: Diagnosis not present

## 2020-11-15 DIAGNOSIS — E1122 Type 2 diabetes mellitus with diabetic chronic kidney disease: Secondary | ICD-10-CM | POA: Diagnosis not present

## 2020-11-15 DIAGNOSIS — D631 Anemia in chronic kidney disease: Secondary | ICD-10-CM | POA: Diagnosis not present

## 2020-11-18 DIAGNOSIS — N186 End stage renal disease: Secondary | ICD-10-CM | POA: Diagnosis not present

## 2020-11-18 DIAGNOSIS — D631 Anemia in chronic kidney disease: Secondary | ICD-10-CM | POA: Diagnosis not present

## 2020-11-20 DIAGNOSIS — N186 End stage renal disease: Secondary | ICD-10-CM | POA: Diagnosis not present

## 2020-11-20 DIAGNOSIS — D631 Anemia in chronic kidney disease: Secondary | ICD-10-CM | POA: Diagnosis not present

## 2020-11-22 DIAGNOSIS — N186 End stage renal disease: Secondary | ICD-10-CM | POA: Diagnosis not present

## 2020-11-22 DIAGNOSIS — D631 Anemia in chronic kidney disease: Secondary | ICD-10-CM | POA: Diagnosis not present

## 2020-11-25 DIAGNOSIS — D631 Anemia in chronic kidney disease: Secondary | ICD-10-CM | POA: Diagnosis not present

## 2020-11-25 DIAGNOSIS — N186 End stage renal disease: Secondary | ICD-10-CM | POA: Diagnosis not present

## 2020-11-27 DIAGNOSIS — D631 Anemia in chronic kidney disease: Secondary | ICD-10-CM | POA: Diagnosis not present

## 2020-11-27 DIAGNOSIS — R0602 Shortness of breath: Secondary | ICD-10-CM | POA: Diagnosis not present

## 2020-11-27 DIAGNOSIS — N186 End stage renal disease: Secondary | ICD-10-CM | POA: Diagnosis not present

## 2020-11-29 DIAGNOSIS — R0602 Shortness of breath: Secondary | ICD-10-CM | POA: Diagnosis not present

## 2020-11-29 DIAGNOSIS — N186 End stage renal disease: Secondary | ICD-10-CM | POA: Diagnosis not present

## 2020-11-29 DIAGNOSIS — D631 Anemia in chronic kidney disease: Secondary | ICD-10-CM | POA: Diagnosis not present

## 2020-12-02 DIAGNOSIS — R0602 Shortness of breath: Secondary | ICD-10-CM | POA: Diagnosis not present

## 2020-12-02 DIAGNOSIS — N186 End stage renal disease: Secondary | ICD-10-CM | POA: Diagnosis not present

## 2020-12-02 DIAGNOSIS — D631 Anemia in chronic kidney disease: Secondary | ICD-10-CM | POA: Diagnosis not present

## 2020-12-04 DIAGNOSIS — R0602 Shortness of breath: Secondary | ICD-10-CM | POA: Diagnosis not present

## 2020-12-04 DIAGNOSIS — D631 Anemia in chronic kidney disease: Secondary | ICD-10-CM | POA: Diagnosis not present

## 2020-12-04 DIAGNOSIS — N186 End stage renal disease: Secondary | ICD-10-CM | POA: Diagnosis not present

## 2020-12-06 DIAGNOSIS — R0602 Shortness of breath: Secondary | ICD-10-CM | POA: Diagnosis not present

## 2020-12-06 DIAGNOSIS — R059 Cough, unspecified: Secondary | ICD-10-CM | POA: Diagnosis not present

## 2020-12-06 DIAGNOSIS — Z7982 Long term (current) use of aspirin: Secondary | ICD-10-CM | POA: Diagnosis not present

## 2020-12-06 DIAGNOSIS — Z87891 Personal history of nicotine dependence: Secondary | ICD-10-CM | POA: Diagnosis not present

## 2020-12-06 DIAGNOSIS — N186 End stage renal disease: Secondary | ICD-10-CM | POA: Diagnosis not present

## 2020-12-06 DIAGNOSIS — J189 Pneumonia, unspecified organism: Secondary | ICD-10-CM | POA: Diagnosis not present

## 2020-12-06 DIAGNOSIS — E785 Hyperlipidemia, unspecified: Secondary | ICD-10-CM | POA: Diagnosis not present

## 2020-12-06 DIAGNOSIS — Z79899 Other long term (current) drug therapy: Secondary | ICD-10-CM | POA: Diagnosis not present

## 2020-12-06 DIAGNOSIS — R0981 Nasal congestion: Secondary | ICD-10-CM | POA: Diagnosis not present

## 2020-12-06 DIAGNOSIS — D631 Anemia in chronic kidney disease: Secondary | ICD-10-CM | POA: Diagnosis not present

## 2020-12-06 DIAGNOSIS — R918 Other nonspecific abnormal finding of lung field: Secondary | ICD-10-CM | POA: Diagnosis not present

## 2020-12-06 DIAGNOSIS — J168 Pneumonia due to other specified infectious organisms: Secondary | ICD-10-CM | POA: Diagnosis not present

## 2020-12-06 DIAGNOSIS — I120 Hypertensive chronic kidney disease with stage 5 chronic kidney disease or end stage renal disease: Secondary | ICD-10-CM | POA: Diagnosis not present

## 2020-12-06 DIAGNOSIS — Z794 Long term (current) use of insulin: Secondary | ICD-10-CM | POA: Diagnosis not present

## 2020-12-06 DIAGNOSIS — Z888 Allergy status to other drugs, medicaments and biological substances status: Secondary | ICD-10-CM | POA: Diagnosis not present

## 2020-12-06 DIAGNOSIS — Z992 Dependence on renal dialysis: Secondary | ICD-10-CM | POA: Diagnosis not present

## 2020-12-11 DIAGNOSIS — E1122 Type 2 diabetes mellitus with diabetic chronic kidney disease: Secondary | ICD-10-CM | POA: Diagnosis not present

## 2020-12-12 DIAGNOSIS — N186 End stage renal disease: Secondary | ICD-10-CM | POA: Diagnosis not present

## 2020-12-12 DIAGNOSIS — R0602 Shortness of breath: Secondary | ICD-10-CM | POA: Diagnosis not present

## 2020-12-12 DIAGNOSIS — J189 Pneumonia, unspecified organism: Secondary | ICD-10-CM | POA: Diagnosis not present

## 2020-12-13 DIAGNOSIS — G4737 Central sleep apnea in conditions classified elsewhere: Secondary | ICD-10-CM | POA: Diagnosis not present

## 2021-01-06 DIAGNOSIS — Z992 Dependence on renal dialysis: Secondary | ICD-10-CM | POA: Diagnosis not present

## 2021-01-06 DIAGNOSIS — N186 End stage renal disease: Secondary | ICD-10-CM | POA: Diagnosis not present

## 2021-01-07 DIAGNOSIS — E113411 Type 2 diabetes mellitus with severe nonproliferative diabetic retinopathy with macular edema, right eye: Secondary | ICD-10-CM | POA: Diagnosis not present

## 2021-01-07 DIAGNOSIS — E113512 Type 2 diabetes mellitus with proliferative diabetic retinopathy with macular edema, left eye: Secondary | ICD-10-CM | POA: Diagnosis not present

## 2021-01-20 DIAGNOSIS — J189 Pneumonia, unspecified organism: Secondary | ICD-10-CM | POA: Diagnosis not present

## 2021-01-20 DIAGNOSIS — R0602 Shortness of breath: Secondary | ICD-10-CM | POA: Diagnosis not present

## 2021-01-20 DIAGNOSIS — U071 COVID-19: Secondary | ICD-10-CM | POA: Diagnosis not present

## 2021-01-20 DIAGNOSIS — J1282 Pneumonia due to coronavirus disease 2019: Secondary | ICD-10-CM | POA: Diagnosis not present

## 2021-01-20 DIAGNOSIS — Z8616 Personal history of COVID-19: Secondary | ICD-10-CM | POA: Diagnosis not present

## 2021-02-19 ENCOUNTER — Other Ambulatory Visit: Payer: Self-pay | Admitting: Internal Medicine
